# Patient Record
Sex: Male | Born: 1940 | Smoking: Never smoker
Health system: Southern US, Community
[De-identification: ages and names within clinical notes are randomized; demographics above are authoritative.]

## PROBLEM LIST (undated history)

## (undated) DIAGNOSIS — I4891 Unspecified atrial fibrillation: Secondary | ICD-10-CM

## (undated) DIAGNOSIS — E785 Hyperlipidemia, unspecified: Secondary | ICD-10-CM

## (undated) DIAGNOSIS — I509 Heart failure, unspecified: Secondary | ICD-10-CM

## (undated) DIAGNOSIS — N200 Calculus of kidney: Secondary | ICD-10-CM

## (undated) DIAGNOSIS — Z862 Personal history of diseases of the blood and blood-forming organs and certain disorders involving the immune mechanism: Secondary | ICD-10-CM

## (undated) DIAGNOSIS — D473 Essential (hemorrhagic) thrombocythemia: Secondary | ICD-10-CM

## (undated) DIAGNOSIS — I272 Pulmonary hypertension, unspecified: Secondary | ICD-10-CM

## (undated) DIAGNOSIS — E039 Hypothyroidism, unspecified: Secondary | ICD-10-CM

## (undated) DIAGNOSIS — I10 Essential (primary) hypertension: Secondary | ICD-10-CM

## (undated) DIAGNOSIS — N289 Disorder of kidney and ureter, unspecified: Secondary | ICD-10-CM

## (undated) HISTORY — DX: Pulmonary hypertension, unspecified: I27.20

## (undated) HISTORY — DX: Unspecified atrial fibrillation: I48.91

## (undated) HISTORY — DX: Essential (hemorrhagic) thrombocythemia: D47.3

## (undated) HISTORY — DX: Calculus of kidney: N20.0

## (undated) HISTORY — DX: Hypothyroidism, unspecified: E03.9

## (undated) HISTORY — DX: Heart failure, unspecified: I50.9

## (undated) HISTORY — DX: Hyperlipidemia, unspecified: E78.5

---

## 2010-10-29 ENCOUNTER — Encounter: Payer: Self-pay | Admitting: Oncology

## 2010-11-08 ENCOUNTER — Encounter: Payer: Non-veteran care | Admitting: Oncology

## 2010-11-09 ENCOUNTER — Encounter (HOSPITAL_BASED_OUTPATIENT_CLINIC_OR_DEPARTMENT_OTHER): Payer: Non-veteran care | Admitting: Oncology

## 2010-11-09 ENCOUNTER — Other Ambulatory Visit: Payer: Self-pay | Admitting: Oncology

## 2010-11-09 DIAGNOSIS — D531 Other megaloblastic anemias, not elsewhere classified: Secondary | ICD-10-CM

## 2010-11-09 DIAGNOSIS — I4891 Unspecified atrial fibrillation: Secondary | ICD-10-CM

## 2010-11-09 DIAGNOSIS — I10 Essential (primary) hypertension: Secondary | ICD-10-CM

## 2010-11-09 LAB — CBC WITH DIFFERENTIAL/PLATELET
EOS%: 0.2 % (ref 0.0–7.0)
Eosinophils Absolute: 0 10*3/uL (ref 0.0–0.5)
MCV: 110.8 fL — ABNORMAL HIGH (ref 79.3–98.0)
MONO%: 4.7 % (ref 0.0–14.0)
NEUT#: 3.7 10*3/uL (ref 1.5–6.5)
RBC: 3.6 10*6/uL — ABNORMAL LOW (ref 4.20–5.82)
RDW: 17.6 % — ABNORMAL HIGH (ref 11.0–14.6)
WBC: 6.1 10*3/uL (ref 4.0–10.3)
lymph#: 2.1 10*3/uL (ref 0.9–3.3)

## 2010-11-09 LAB — CHCC SMEAR

## 2011-02-05 ENCOUNTER — Telehealth: Payer: Self-pay | Admitting: Oncology

## 2011-02-05 NOTE — Telephone Encounter (Signed)
lmonvm for pt re appts for 1/11 and 4/10. Jan/april schedule mailed today.

## 2011-02-18 ENCOUNTER — Telehealth: Payer: Self-pay | Admitting: *Deleted

## 2011-02-18 ENCOUNTER — Other Ambulatory Visit: Payer: Self-pay | Admitting: Oncology

## 2011-02-18 ENCOUNTER — Other Ambulatory Visit (HOSPITAL_BASED_OUTPATIENT_CLINIC_OR_DEPARTMENT_OTHER): Payer: Non-veteran care | Admitting: Lab

## 2011-02-18 DIAGNOSIS — D531 Other megaloblastic anemias, not elsewhere classified: Secondary | ICD-10-CM

## 2011-02-18 LAB — CBC WITH DIFFERENTIAL/PLATELET
BASO%: 0.6 % (ref 0.0–2.0)
Eosinophils Absolute: 0 10*3/uL (ref 0.0–0.5)
HCT: 36.5 % — ABNORMAL LOW (ref 38.4–49.9)
MCHC: 34.1 g/dL (ref 32.0–36.0)
MONO#: 0.4 10*3/uL (ref 0.1–0.9)
NEUT#: 5.8 10*3/uL (ref 1.5–6.5)
RBC: 3.16 10*6/uL — ABNORMAL LOW (ref 4.20–5.82)
WBC: 7.8 10*3/uL (ref 4.0–10.3)
lymph#: 1.5 10*3/uL (ref 0.9–3.3)

## 2011-02-18 NOTE — Progress Notes (Signed)
Pt was waiting in lobby for results. Spoke w/ pt and relayed Dr. Ha's message this his Platelet count is stable and to continue his Hydrea 500 mg twice daily. Keep next appt in April as scheduled. Pt verbalized understanding.        

## 2011-02-18 NOTE — Telephone Encounter (Signed)
Pt was waiting in lobby for results. Spoke w/ pt and relayed Dr. Lodema Pilot message this his Platelet count is stable and to continue his Hydrea 500 mg twice daily. Keep next appt in April as scheduled. Pt verbalized understanding.

## 2011-05-14 ENCOUNTER — Encounter (HOSPITAL_COMMUNITY)
Admission: EM | Disposition: A | Discharge status: Home / Self Care | Payer: Self-pay | Source: Home / Self Care | Attending: Emergency Medicine

## 2011-05-14 ENCOUNTER — Emergency Department (HOSPITAL_COMMUNITY): Payer: Non-veteran care | Admitting: *Deleted

## 2011-05-14 ENCOUNTER — Encounter (HOSPITAL_COMMUNITY): Payer: Self-pay | Admitting: *Deleted

## 2011-05-14 ENCOUNTER — Emergency Department (HOSPITAL_COMMUNITY)
Admission: EM | Admit: 2011-05-14 | Discharge: 2011-05-14 | Disposition: A | Discharge status: Home / Self Care | Payer: Non-veteran care | Attending: Emergency Medicine | Admitting: Emergency Medicine

## 2011-05-14 ENCOUNTER — Encounter (HOSPITAL_COMMUNITY): Payer: Self-pay

## 2011-05-14 ENCOUNTER — Emergency Department (HOSPITAL_COMMUNITY): Payer: Non-veteran care

## 2011-05-14 DIAGNOSIS — N289 Disorder of kidney and ureter, unspecified: Secondary | ICD-10-CM | POA: Insufficient documentation

## 2011-05-14 DIAGNOSIS — IMO0002 Reserved for concepts with insufficient information to code with codable children: Secondary | ICD-10-CM | POA: Insufficient documentation

## 2011-05-14 DIAGNOSIS — S68620A Partial traumatic transphalangeal amputation of right index finger, initial encounter: Secondary | ICD-10-CM

## 2011-05-14 DIAGNOSIS — W312XXA Contact with powered woodworking and forming machines, initial encounter: Secondary | ICD-10-CM | POA: Insufficient documentation

## 2011-05-14 DIAGNOSIS — I10 Essential (primary) hypertension: Secondary | ICD-10-CM | POA: Insufficient documentation

## 2011-05-14 DIAGNOSIS — Z79899 Other long term (current) drug therapy: Secondary | ICD-10-CM | POA: Insufficient documentation

## 2011-05-14 HISTORY — DX: Disorder of kidney and ureter, unspecified: N28.9

## 2011-05-14 HISTORY — DX: Essential (primary) hypertension: I10

## 2011-05-14 HISTORY — PX: AMPUTATION: SHX166

## 2011-05-14 LAB — CBC
Hemoglobin: 15.4 g/dL (ref 13.0–17.0)
MCH: 27.9 pg (ref 26.0–34.0)
MCHC: 31.8 g/dL (ref 30.0–36.0)
Platelets: 358 10*3/uL (ref 150–400)
RDW: 16.1 % — ABNORMAL HIGH (ref 11.5–15.5)

## 2011-05-14 LAB — BASIC METABOLIC PANEL
Calcium: 8.7 mg/dL (ref 8.4–10.5)
GFR calc Af Amer: 26 mL/min — ABNORMAL LOW (ref >90)
GFR calc non Af Amer: 23 mL/min — ABNORMAL LOW (ref >90)
Glucose, Bld: 104 mg/dL — ABNORMAL HIGH (ref 70–99)
Sodium: 137 mEq/L (ref 135–145)

## 2011-05-14 LAB — PROTIME-INR: INR: 1.62 — ABNORMAL HIGH (ref 0.00–1.49)

## 2011-05-14 SURGERY — AMPUTATION DIGIT
Anesthesia: General | Site: Arm Lower | Laterality: Right | Wound class: Dirty or Infected

## 2011-05-14 MED ORDER — ONDANSETRON HCL 4 MG/2ML IJ SOLN
INTRAMUSCULAR | Status: DC | PRN
  Administered 2011-05-14: 4 mg via INTRAVENOUS

## 2011-05-14 MED ORDER — TETANUS-DIPHTH-ACELL PERTUSSIS 5-2.5-18.5 LF-MCG/0.5 IM SUSP
0.5000 mL | Freq: Once | INTRAMUSCULAR | Status: AC
  Administered 2011-05-14: 0.5 mL via INTRAMUSCULAR
  Filled 2011-05-14: qty 0.5

## 2011-05-14 MED ORDER — ACETAMINOPHEN 10 MG/ML IV SOLN
INTRAVENOUS | Status: AC
  Filled 2011-05-14: qty 100

## 2011-05-14 MED ORDER — BUPIVACAINE HCL (PF) 0.5 % IJ SOLN
30.0000 mL | Freq: Once | INTRAMUSCULAR | Status: AC
  Administered 2011-05-14: 30 mL

## 2011-05-14 MED ORDER — SODIUM CHLORIDE 0.9 % IV SOLN
INTRAVENOUS | Status: DC | PRN
  Administered 2011-05-14: 17:00:00 via INTRAVENOUS

## 2011-05-14 MED ORDER — FENTANYL CITRATE 0.05 MG/ML IJ SOLN
INTRAMUSCULAR | Status: DC | PRN
  Administered 2011-05-14: 100 ug via INTRAVENOUS

## 2011-05-14 MED ORDER — 0.9 % SODIUM CHLORIDE (POUR BTL) OPTIME
TOPICAL | Status: DC | PRN
  Administered 2011-05-14: 1000 mL

## 2011-05-14 MED ORDER — METOCLOPRAMIDE HCL 5 MG/ML IJ SOLN
INTRAMUSCULAR | Status: DC | PRN
  Administered 2011-05-14: 10 mg via INTRAVENOUS

## 2011-05-14 MED ORDER — DEXAMETHASONE SODIUM PHOSPHATE 10 MG/ML IJ SOLN
INTRAMUSCULAR | Status: DC | PRN
  Administered 2011-05-14: 10 mg via INTRAVENOUS

## 2011-05-14 MED ORDER — LIDOCAINE HCL (CARDIAC) 20 MG/ML IV SOLN
INTRAVENOUS | Status: DC | PRN
  Administered 2011-05-14: 100 mg via INTRAVENOUS

## 2011-05-14 MED ORDER — LACTATED RINGERS IV SOLN
INTRAVENOUS | Status: DC | PRN
  Administered 2011-05-14: 18:00:00 via INTRAVENOUS

## 2011-05-14 MED ORDER — KETAMINE HCL 50 MG/ML IJ SOLN
INTRAMUSCULAR | Status: DC | PRN
  Administered 2011-05-14 (×2): 20 mg via INTRAMUSCULAR
  Administered 2011-05-14: 10 mg via INTRAMUSCULAR

## 2011-05-14 MED ORDER — FENTANYL CITRATE 0.05 MG/ML IJ SOLN: 25.0000 ug | INTRAMUSCULAR | Status: DC | PRN

## 2011-05-14 MED ORDER — ACETAMINOPHEN 10 MG/ML IV SOLN
INTRAVENOUS | Status: DC | PRN
  Administered 2011-05-14: 1000 mg via INTRAVENOUS

## 2011-05-14 MED ORDER — PHENYLEPHRINE HCL 10 MG/ML IJ SOLN
10.0000 mg | INTRAVENOUS | Status: DC | PRN
  Administered 2011-05-14: 50 ug/min via INTRAVENOUS

## 2011-05-14 MED ORDER — EPHEDRINE SULFATE 50 MG/ML IJ SOLN
INTRAMUSCULAR | Status: DC | PRN
  Administered 2011-05-14: 10 mg via INTRAVENOUS
  Administered 2011-05-14: 15 mg via INTRAVENOUS
  Administered 2011-05-14: 5 mg via INTRAVENOUS

## 2011-05-14 MED ORDER — PROPOFOL 10 MG/ML IV BOLUS
INTRAVENOUS | Status: DC | PRN
  Administered 2011-05-14: 200 mg via INTRAVENOUS

## 2011-05-14 MED ORDER — BUPIVACAINE HCL (PF) 0.5 % IJ SOLN
INTRAMUSCULAR | Status: AC
  Filled 2011-05-14: qty 30

## 2011-05-14 MED ORDER — SUCCINYLCHOLINE CHLORIDE 20 MG/ML IJ SOLN
INTRAMUSCULAR | Status: DC | PRN
  Administered 2011-05-14: 100 mg via INTRAVENOUS

## 2011-05-14 MED ORDER — CEFAZOLIN SODIUM 1-5 GM-% IV SOLN
1.0000 g | Freq: Once | INTRAVENOUS | Status: AC
  Administered 2011-05-14: 1 g via INTRAVENOUS
  Filled 2011-05-14: qty 50

## 2011-05-14 SURGICAL SUPPLY — 33 items
BAG ZIPLOCK 12X15 (MISCELLANEOUS) ×2 IMPLANT
BANDAGE COBAN STERILE 2 (GAUZE/BANDAGES/DRESSINGS) ×2 IMPLANT
BANDAGE CONFORM 2  STR LF (GAUZE/BANDAGES/DRESSINGS) ×2 IMPLANT
BANDAGE GAUZE ELAST BULKY 4 IN (GAUZE/BANDAGES/DRESSINGS) ×2 IMPLANT
CANISTER SUCTION 2500CC (MISCELLANEOUS) ×2 IMPLANT
CLOTH BEACON ORANGE TIMEOUT ST (SAFETY) ×2 IMPLANT
CORDS BIPOLAR (ELECTRODE) ×2 IMPLANT
CUFF TOURN SGL QUICK 18 (TOURNIQUET CUFF) ×2 IMPLANT
CUFF TOURN SGL QUICK 24 (TOURNIQUET CUFF)
CUFF TRNQT CYL 24X4X40X1 (TOURNIQUET CUFF) IMPLANT
DRAPE SURG 17X11 SM STRL (DRAPES) ×4 IMPLANT
ELECT REM PT RETURN 9FT ADLT (ELECTROSURGICAL) ×2
ELECTRODE REM PT RTRN 9FT ADLT (ELECTROSURGICAL) ×1 IMPLANT
GAUZE XEROFORM 1X8 LF (GAUZE/BANDAGES/DRESSINGS) ×2 IMPLANT
GLOVE BIOGEL M STRL SZ7.5 (GLOVE) IMPLANT
GLOVE BIOGEL PI IND STRL 6.5 (GLOVE) ×1 IMPLANT
GLOVE BIOGEL PI IND STRL 7.0 (GLOVE) ×1 IMPLANT
GLOVE BIOGEL PI IND STRL 7.5 (GLOVE) ×1 IMPLANT
GLOVE BIOGEL PI INDICATOR 6.5 (GLOVE) ×1
GLOVE BIOGEL PI INDICATOR 7.0 (GLOVE) ×1
GLOVE BIOGEL PI INDICATOR 7.5 (GLOVE) ×1
KIT BASIN OR (CUSTOM PROCEDURE TRAY) ×2 IMPLANT
NEEDLE HYPO 22GX1.5 SAFETY (NEEDLE) ×2 IMPLANT
NS IRRIG 1000ML POUR BTL (IV SOLUTION) ×2 IMPLANT
PACK LOWER EXTREMITY WL (CUSTOM PROCEDURE TRAY) ×2 IMPLANT
POSITIONER SURGICAL ARM (MISCELLANEOUS) ×2 IMPLANT
SUT ETHILON 5 0 P 3 18 (SUTURE) ×2
SUT NYLON 10 0 BLK MONFIL (SUTURE) ×2 IMPLANT
SUT NYLON ETHILON 5-0 P-3 1X18 (SUTURE) ×2 IMPLANT
SWAB COLLECTION DEVICE MRSA (MISCELLANEOUS) IMPLANT
SYR CONTROL 10ML LL (SYRINGE) IMPLANT
TUBE ANAEROBIC SPECIMEN COL (MISCELLANEOUS) IMPLANT
UNDERPAD 30X30 INCONTINENT (UNDERPADS AND DIAPERS) ×2 IMPLANT

## 2011-05-14 NOTE — Anesthesia Postprocedure Evaluation (Signed)
  Anesthesia Post-op Note  Patient: Erik Ochoa  Procedure(s) Performed: Procedure(s) (LRB): AMPUTATION DIGIT (Right)  Patient Location: PACU  Anesthesia Type: General.  Level of Consciousness: awake and alert   Airway and Oxygen Therapy: Patient Spontanous Breathing  Post-op Pain: mild  Post-op Assessment: Post-op Vital signs reviewed, Patient's Cardiovascular Status Stable, Respiratory Function Stable, Patent Airway and No signs of Nausea or vomiting  Post-op Vital Signs: stable  Complications: No apparent anesthesia complications

## 2011-05-14 NOTE — ED Notes (Signed)
Patient reports that he got his right index finger caught between a log and log splitter Patient left his glove on and had a piece of material tied around his wrist. Patient able to move all fingers, but did not attempt to move right index finger.

## 2011-05-14 NOTE — Discharge Planning (Signed)
Discharge Instructions:  Keep your dressing clean, dry and in place until instructed to remove by Dr. Maryuri Warnke.  If the dressing becomes dirty or wet call the office for instructions during business hours. Elevate the extremity to help with swelling, this will also help with any discomfort. Take your medication as prescribed. No lifting with the injured  extremity. If you feel that the dressing is too tight, you may loosen it, but keep it on; finger tips should be pink; if there is a concern, call the office. [phone] Ice may be used if the injury is a fracture, do not apply ice directly to the skin. Please call the office on the next business day after discharge to arrange a follow up appointment.  Call [phone] between the hours of 9am - 5pm M-Th or 9am - 1pm on Fri. For most hand injuries and/or conditions, you may return to work using the uninjured hand (one handed duty) within 24-72 hours.  A detailed note will be provided to you at your follow up appointment or may contact the office prior to your follow up.    

## 2011-05-14 NOTE — Discharge Instructions (Signed)
Discharge Instructions:  Keep your dressing clean, dry and in place until instructed to remove by Dr. Coley.  If the dressing becomes dirty or wet call the office for instructions during business hours. Elevate the extremity to help with swelling, this will also help with any discomfort. Take your medication as prescribed. No lifting with the injured  extremity. If you feel that the dressing is too tight, you may loosen it, but keep it on; finger tips should be pink; if there is a concern, call the office. [phone] Ice may be used if the injury is a fracture, do not apply ice directly to the skin. Please call the office on the next business day after discharge to arrange a follow up appointment.  Call [phone] between the hours of 9am - 5pm M-Th or 9am - 1pm on Fri. For most hand injuries and/or conditions, you may return to work using the uninjured hand (one handed duty) within 24-72 hours.  A detailed note will be provided to you at your follow up appointment or may contact the office prior to your follow up.    

## 2011-05-14 NOTE — Op Note (Signed)
05/14/2011  6:37 PM  PATIENT:  Erik Ochoa  71 y.o. male  PRE-OPERATIVE DIAGNOSIS:  open fracture right index finger, near amputation, ischemia rif  POST-OPERATIVE DIAGNOSIS:  open fracture index finger, same  PROCEDURE:  Procedure(s): Attempted revascularization of RIF AMPUTATION DIGIT  SURGEON:  Surgeon(s): Johnette Abraham, MD  ANESTHESIA:   general  SPECIMEN:  No Specimen  FINDINGS:  Ischemic rif, ulnar digital artery repaired - no re-perfusion  PATIENT DISPOSITION:  PACU - hemodynamically stable.

## 2011-05-14 NOTE — Anesthesia Procedure Notes (Signed)
Procedure Name: Intubation Date/Time: 05/14/2011 5:03 PM Performed by: Randon Goldsmith CATHERINE PAYNE Pre-anesthesia Checklist: Patient identified, Emergency Drugs available, Suction available and Patient being monitored Patient Re-evaluated:Patient Re-evaluated prior to inductionOxygen Delivery Method: Circle system utilized Preoxygenation: Pre-oxygenation with 100% oxygen Intubation Type: IV induction, Rapid sequence and Cricoid Pressure applied Laryngoscope Size: Mac and 4 Grade View: Grade I Tube type: Oral Number of attempts: 1 Airway Equipment and Method: Stylet Placement Confirmation: ETT inserted through vocal cords under direct vision,  positive ETCO2,  CO2 detector and breath sounds checked- equal and bilateral Secured at: 21 cm Tube secured with: Tape Dental Injury: Teeth and Oropharynx as per pre-operative assessment

## 2011-05-14 NOTE — H&P (Signed)
Reason for Consult:R finger injury Referring Physician: [facility]  Erik Ochoa is an 71 y.o. right handed male.  HPI: Pt was using a log splitter and got RIF caught in machine, c/o pain, bleeding of IF, no sensation, minimal tip movement  Past Medical History  Diagnosis Date  . Hypertension   . Renal disorder     History reviewed. No pertinent past surgical history.  Family History  Problem Relation Age of Onset  . Cancer Mother   . Cancer Brother     Social History:  reports that he has never smoked. He has quit using smokeless tobacco. He reports that he does not drink alcohol or use illicit drugs.  Allergies: No Known Allergies  Medications: I have reviewed the patient's current medications.  Results for orders placed during the hospital encounter of 05/14/11 (from the past 48 hour(s))  CBC     Status: Abnormal   Collection Time   05/14/11  2:45 PM      Component Value Range Comment   WBC 17.5 (*) 4.0 - 10.5 (K/uL)    RBC 5.52  4.22 - 5.81 (MIL/uL)    Hemoglobin 15.4  13.0 - 17.0 (g/dL)    HCT 16.1  09.6 - 04.5 (%)    MCV 87.7  78.0 - 100.0 (fL)    MCH 27.9  26.0 - 34.0 (pg)    MCHC 31.8  30.0 - 36.0 (g/dL)    RDW 40.9 (*) 81.1 - 15.5 (%)    Platelets 358  150 - 400 (K/uL)   BASIC METABOLIC PANEL     Status: Abnormal   Collection Time   05/14/11  2:45 PM      Component Value Range Comment   Sodium 137  135 - 145 (mEq/L)    Potassium 3.6  3.5 - 5.1 (mEq/L)    Chloride 104  96 - 112 (mEq/L)    CO2 20  19 - 32 (mEq/L)    Glucose, Bld 104 (*) 70 - 99 (mg/dL)    BUN 35 (*) 6 - 23 (mg/dL)    Creatinine, Ser 9.14 (*) 0.50 - 1.35 (mg/dL)    Calcium 8.7  8.4 - 10.5 (mg/dL)    GFR calc non Af Amer 23 (*) >90 (mL/min)    GFR calc Af Amer 26 (*) >90 (mL/min)     Dg Finger Index Right  05/14/2011  *RADIOLOGY REPORT*  Clinical Data:  finger injury  RIGHT INDEX FINGER 2+V  Comparison: None.  Findings: Three views of the right second finger submitted.  There is  comminuted displaced fracture of the middle phalanx second finger with associated soft tissue injury.  IMPRESSION: Comminuted displaced fracture of the middle phalanx right second finger with associated soft tissues injury.  Original Report Authenticated By: Natasha Mead, M.D.    Pertinent items are noted in HPI. Temp:  [97.9 F (36.6 C)] 97.9 F (36.6 C) (04/06 1411) Resp:  [16] 16  (04/06 1411) BP: (181)/(81) 181/81 mmHg (04/06 1411) SpO2:  [95 %] 95 % (04/06 1411) General appearance: alert and cooperative Resp: clear to auscultation bilaterally Cardio: regular rate and rhythm GI: soft, nt LUE; wnl; RUE wnl except for complex open fracture laceration of RIF over middle phalanx, tip whitish, cold, insensate   Assessment/Plan: RIF near amputation, open fx, ischemic Plan: discussed in detail possibility of not being able to salvage if tip, will attempt to revascularize, may require amputation.  Erik Ochoa CHRISTOPHER 05/14/2011, 4:13 PM

## 2011-05-14 NOTE — ED Provider Notes (Signed)
History     CSN: [id number]  Arrival date & time 05/14/11  1350   First MD Initiated Contact with Patient 05/14/11 1500      Chief Complaint  Patient presents with  . Finger Injury    (Consider location/radiation/quality/duration/timing/severity/associated sxs/prior treatment) HPI 1 hour before injury pt sustained crush injury to R index finger in log splitter. Decreased sensation distal to laceration. > 5 years since last Td. R hand dominant. No other injuries. Bleeding controlled.  Past Medical History  Diagnosis Date  . Hypertension   . Renal disorder     History reviewed. No pertinent past surgical history.  Family History  Problem Relation Age of Onset  . Cancer Mother   . Cancer Brother     History  Substance Use Topics  . Smoking status: Never Smoker   . Smokeless tobacco: Former Neurosurgeon  . Alcohol Use: No      Review of Systems  Skin: Positive for wound.  Neurological: Positive for numbness.    Allergies  Review of patient's allergies indicates no known allergies.  Home Medications   Current Outpatient Rx  Name Route Sig Dispense Refill  . AMLODIPINE BESYLATE 10 MG PO TABS Oral Take 10 mg by mouth daily.    . FUROSEMIDE 20 MG PO TABS Oral Take 20 mg by mouth 2 (two) times daily.    Marland Kitchen HYDROXYUREA 500 MG PO CAPS Oral Take 500 mg by mouth 2 (two) times daily. May take with food to minimize GI side effects.    Marland Kitchen LEVOTHYROXINE SODIUM 25 MCG PO TABS Oral Take 25 mcg by mouth daily.    Marland Kitchen METOPROLOL TARTRATE 50 MG PO TABS Oral Take 50 mg by mouth 2 (two) times daily.    Marland Kitchen SIMVASTATIN 40 MG PO TABS Oral Take 40 mg by mouth every evening.      BP 141/65  Pulse 60  Temp(Src) 99.7 F (37.6 C) (Oral)  Resp 16  SpO2 94%  Physical Exam  Nursing note and vitals reviewed. Constitutional: He is oriented to person, place, and time. He appears well-developed and well-nourished. No distress.  HENT:  Head: Normocephalic and atraumatic.  Mouth/Throat: Oropharynx  is clear and moist.  Eyes: EOM are normal. Pupils are equal, round, and reactive to light.  Neck: Normal range of motion. Neck supple.  Cardiovascular: Normal rate and regular rhythm.   Pulmonary/Chest: Effort normal and breath sounds normal. No respiratory distress. He has no wheezes. He has no rales.  Abdominal: Soft. Bowel sounds are normal. There is no tenderness. There is no rebound and no guarding.  Musculoskeletal:       Pt has macerated laceration overlying middle phalanx of 2nd digit of R hand. Near complete amputation with distal portion attached solely by lateral soft tissue. Distal palor of soft tissue with questionable cap refill. Decreased sensation distally.  Neurological: He is alert and oriented to person, place, and time.       Decreased sensation distal R 2nd digit  Skin: Skin is warm and dry. No rash noted. No erythema.  Psychiatric: He has a normal mood and affect. His behavior is normal.    ED Course  Procedures (including critical care time)  Labs Reviewed  CBC - Abnormal; Notable for the following:    WBC 17.5 (*)    RDW 16.1 (*)    All other components within normal limits  BASIC METABOLIC PANEL - Abnormal; Notable for the following:    Glucose, Bld 104 (*)  BUN 35 (*)    Creatinine, Ser 2.66 (*)    GFR calc non Af Amer 23 (*)    GFR calc Af Amer 26 (*)    All other components within normal limits  PROTIME-INR - Abnormal; Notable for the following:    Prothrombin Time 19.5 (*)    INR 1.62 (*)    All other components within normal limits  APTT - Abnormal; Notable for the following:    aPTT 48 (*)    All other components within normal limits   Dg Finger Index Right  05/14/2011  *RADIOLOGY REPORT*  Clinical Data:  finger injury  RIGHT INDEX FINGER 2+V  Comparison: None.  Findings: Three views of the right second finger submitted.  There is comminuted displaced fracture of the middle phalanx second finger with associated soft tissue injury.  IMPRESSION:  Comminuted displaced fracture of the middle phalanx right second finger with associated soft tissues injury.  Original Report Authenticated By: Natasha Mead, M.D.     1. 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 Asked to prepare to go to OR.        Loren Racer, MD 05/15/11 [phone]

## 2011-05-14 NOTE — ED Notes (Signed)
Dr. Oletta Lamas notified that glove on right hand cut away except for portion at the right index finger due to clot attached.

## 2011-05-14 NOTE — Anesthesia Preprocedure Evaluation (Signed)
Anesthesia Evaluation  Patient identified by MRN, date of birth, ID band Patient awake    Reviewed: Allergy & Precautions, H&P , NPO status , Patient's Chart, lab work & pertinent test results  Airway Mallampati: II TM Distance: >3 FB Neck ROM: Full    Dental No notable dental hx.    Pulmonary neg pulmonary ROS,  breath sounds clear to auscultation  Pulmonary exam normal       Cardiovascular hypertension, Pt. on medications Rhythm:Regular Rate:Normal     Neuro/Psych negative neurological ROS  negative psych ROS   GI/Hepatic negative GI ROS, Neg liver ROS,   Endo/Other  negative endocrine ROS  Renal/GU negative Renal ROS  negative genitourinary   Musculoskeletal negative musculoskeletal ROS (+)   Abdominal   Peds negative pediatric ROS (+)  Hematology negative hematology ROS (+)   Anesthesia Other Findings   Reproductive/Obstetrics negative OB ROS                          Anesthesia Physical Anesthesia Plan  ASA: II  Anesthesia Plan: General   Post-op Pain Management:    Induction: Intravenous  Airway Management Planned: LMA and Oral ETT  Additional Equipment:   Intra-op Plan:   Post-operative Plan: Extubation in OR  Informed Consent: I have reviewed the patients History and Physical, chart, labs and discussed the procedure including the risks, benefits and alternatives for the proposed anesthesia with the patient or authorized representative who has indicated his/her understanding and acceptance.   Dental advisory given  Plan Discussed with: CRNA  Anesthesia Plan Comments:        Anesthesia Quick Evaluation  

## 2011-05-14 NOTE — Transfer of Care (Signed)
Immediate Anesthesia Transfer of Care Note  Patient: Erik Ochoa  Procedure(s) Performed: Procedure(s) (LRB): AMPUTATION DIGIT (Right)  Patient Location: PACU  Anesthesia Type: General  Level of Consciousness: awake, alert , oriented, patient cooperative and responds to stimulation  Airway & Oxygen Therapy: Patient Spontanous Breathing and Patient connected to face mask  Post-op Assessment: Report given to PACU RN, Post -op Vital signs reviewed and stable and Patient moving all extremities  Post vital signs: Reviewed and stable  Complications: No apparent anesthesia complications

## 2011-05-14 NOTE — Preoperative (Signed)
Beta Blockers   Reason not to administer Beta Blockers:Not Applicable 

## 2011-05-15 NOTE — Op Note (Signed)
NAME:  Erik Ochoa, Erik Ochoa NO.:  [id number]  MEDICAL RECORD NO.:  [id number]  LOCATION:  [facility]                         FACILITY:  [facility]  PHYSICIAN:  Johnette Abraham, MD    DATE OF BIRTH:  [birth year]  DATE OF PROCEDURE:  05/14/2011 DATE OF DISCHARGE:  05/14/2011                              OPERATIVE REPORT   PREOPERATIVE DIAGNOSIS:  Complex, open fracture near amputation of the right index finger.  POSTOPERATIVE DIAGNOSIS:  Complex, open fracture near amputation of the right index finger.  PROCEDURES: 1. Exploration of complex wound of the right index finger, attempted     revascularization of the right index finger ulnar digital artery. 2. Revision amputation with neurectomies and local advancement flap.  ANESTHESIA:  General.  INDICATIONS:  [name] is a 71 year old male who had his finger Crushed while he was splitting wood with a log splitter this afternoon and presented to the emergency department with a pale, ischemic, deformed finger with an obvious open fracture.  I was consulted.  X-ray examination revealed a comminuted fracture just distal to the PIP joint of the right index finger with comminution.  On exam, the finger was ischemic and insensate.  He had a circumferential laceration with just a little bit of skin on the dorsal radial side holding the finger on.  There was obvious extensor tendon and flexor tendon injury.  The risks, benefits, and alternatives of surgery were discussed with the patient.  A long discussion was had with the patient about revascularization and the unlikely survival of the distal part of the finger.  It was discussed with him that an attempt would be made if the structures were felt suitable for revascularization and if they were not, a revision amputation would be performed.  He agreed with this.  DESCRIPTION OF PROCEDURE:  The patient was taken to the operating room, placed supine on the operating room table.   General anesthesia was administered without difficulty.  A time-out was performed. Preoperative antibiotics were given.  The right upper extremity was prepped and draped in a normal sterile fashion.  The arm was elevated and exsanguinated.  The tourniquet was used, inflated to 250 mmHg. First, the wound was explored.  There was an obvious, open, comminuted fracture of the middle phalanx of the right index finger.  The extensor tendon was completely lacerated.  The FDP tendon was lacerated.  The FDS tendon, 1 slip was lacerated.  The neurovascular bundle on the ulnar side were completely lacerated; however, the artery was in view.  On the radial side, neurovascular structures seemed to be intact.  Because these vascular structures seemed to be intact, the tourniquet was released.  After irrigation and gentle debridement of skin, subcutaneous tissue, and bone after applying a warm irrigation solution, the finger did not pink up and, therefore, it was felt that the radial vascular structures were damaged and thrombosed most likely.  Afterwards, the tourniquet was reinflated.  Each end of the proximal and distal of the ulnar-sided digital artery were isolated.  Microscopic instruments and magnification were used.  A peter pan vessel clamp was placed on each proximal and distal vessel.  A vessel dilator was used to  expand the opening. The vessel was repaired with 4 interrupted 10-0 nylon sutures. Afterwards, the tourniquet was released.  There appeared to be flow through the anastomosis, however, after warm irrigation and allowing the finger to sit for a couple of minutes, the finger still did not pink up. At this point, the decision for amputation was then made.  A knife was used to transect the additional skin and structures on the radial side of the finger.  The finger was passed on to the back table.  Following the bone was rongeured back, neurectomies were performed.  The ulnar digital  artery was cauterized proximally.  A local advancement skin flap was used to cover the edge of the bone, and the bone was taken just distal to the joint.  The wound was closed with multiple interrupted 5-0 nylon sutures.  A sterile dressing was applied.  The patient tolerated the procedure well and was taken to the recovery room in stable condition.  ESTIMATED BLOOD LOSS:  50 cc.  COMPLICATIONS:  No acute complications.  SPECIMENS:  Just the amputated part for identification.     Johnette Abraham, MD     HCC/MEDQ  D:  05/14/2011  T:  05/15/2011  Job:  [id number]

## 2011-05-18 ENCOUNTER — Ambulatory Visit (HOSPITAL_BASED_OUTPATIENT_CLINIC_OR_DEPARTMENT_OTHER): Payer: Non-veteran care | Admitting: Oncology

## 2011-05-18 ENCOUNTER — Encounter: Payer: Self-pay | Admitting: Oncology

## 2011-05-18 ENCOUNTER — Telehealth: Payer: Self-pay | Admitting: Oncology

## 2011-05-18 ENCOUNTER — Other Ambulatory Visit (HOSPITAL_BASED_OUTPATIENT_CLINIC_OR_DEPARTMENT_OTHER): Payer: Non-veteran care | Admitting: Lab

## 2011-05-18 VITALS — BP 192/85 | HR 56 | Temp 97.8°F | Ht 76.0 in | Wt 227.6 lb

## 2011-05-18 DIAGNOSIS — D531 Other megaloblastic anemias, not elsewhere classified: Secondary | ICD-10-CM

## 2011-05-18 DIAGNOSIS — D473 Essential (hemorrhagic) thrombocythemia: Secondary | ICD-10-CM | POA: Insufficient documentation

## 2011-05-18 DIAGNOSIS — I4891 Unspecified atrial fibrillation: Secondary | ICD-10-CM | POA: Insufficient documentation

## 2011-05-18 DIAGNOSIS — I272 Pulmonary hypertension, unspecified: Secondary | ICD-10-CM | POA: Insufficient documentation

## 2011-05-18 DIAGNOSIS — I509 Heart failure, unspecified: Secondary | ICD-10-CM | POA: Insufficient documentation

## 2011-05-18 DIAGNOSIS — I10 Essential (primary) hypertension: Secondary | ICD-10-CM | POA: Insufficient documentation

## 2011-05-18 DIAGNOSIS — E039 Hypothyroidism, unspecified: Secondary | ICD-10-CM | POA: Insufficient documentation

## 2011-05-18 DIAGNOSIS — E785 Hyperlipidemia, unspecified: Secondary | ICD-10-CM

## 2011-05-18 LAB — CHCC SMEAR

## 2011-05-18 LAB — CBC WITH DIFFERENTIAL/PLATELET
BASO%: 0.5 % (ref 0.0–2.0)
EOS%: 1.1 % (ref 0.0–7.0)
MCH: 27 pg — ABNORMAL LOW (ref 27.2–33.4)
MCHC: 31.8 g/dL — ABNORMAL LOW (ref 32.0–36.0)
RBC: 5.22 10*6/uL (ref 4.20–5.82)
RDW: 16 % — ABNORMAL HIGH (ref 11.0–14.6)
WBC: 13.3 10*3/uL — ABNORMAL HIGH (ref 4.0–10.3)
lymph#: 1.2 10*3/uL (ref 0.9–3.3)
nRBC: 0 % (ref 0–0)

## 2011-05-18 NOTE — Telephone Encounter (Signed)
gve the pt her June,july,oct 2013 appt calendars

## 2011-05-18 NOTE — Progress Notes (Signed)
[facility] OFFICE PROGRESS NOTE  Cc:  Darrick Huntsman, MD  DIAGNOSIS:  JAK-2 positive essential thrombocytosis.   CURRENT THERAPY:  Hydrea 500mg  PO BID from Dec 2011 to Jan 2013.   Patient self-discontinued Hydrea.   INTERVAL HISTORY: Erik Ochoa 71 y.o. male returns for regular follow up.  He has been going to prayer sessions with his church group.  He had hoped that with prayers, he can healed all his ailments.  Therefore, he had self-discontinued Hydrea since January 2013.  He has denied all thrombotic complication.  He recently had a follow up with his cardiologist; his afib resolved.  Thus, he also discontinued Coumadin.  He was sawing woods and accidentally cut of the right index finger's distal joint.  He presented to a hand surgeon but could not have it re-attached.  He denied bleeding, purulent discharge, or pain in this finger.  Patient denies fatigue, headache, visual changes, confusion, drenching night sweats, palpable lymph node swelling, mucositis, odynophagia, dysphagia, nausea vomiting, jaundice, chest pain, palpitation, shortness of breath, dyspnea on exertion, productive cough, gum bleeding, epistaxis, hematemesis, hemoptysis, abdominal pain, abdominal swelling, early satiety, melena, hematochezia, hematuria, skin rash, spontaneous bleeding, joint swelling, joint pain, heat or cold intolerance, bowel bladder incontinence, back pain, focal motor weakness, paresthesia, depression, suicidal or homocidal ideation, feeling hopelessness.   Past Medical History  Diagnosis Date  . Hypertension   . Renal disorder   . ET (essential thrombocythemia)   . Hypothyroid   . CHF (congestive heart failure)   . A-fib   . Hyperlipidemia   . Pulmonary hypertension   . Nephrolithiasis     No past surgical history on file.  Current Outpatient Prescriptions  Medication Sig Dispense Refill  . acetaminophen-codeine (TYLENOL #3) 300-30 MG per tablet Take 1 tablet by  mouth every 4 (four) hours as needed.      Marland Kitchen amLODipine (NORVASC) 10 MG tablet Take 10 mg by mouth daily.      . furosemide (LASIX) 20 MG tablet Take 20 mg by mouth 2 (two) times daily. As needed      . levothyroxine (SYNTHROID, LEVOTHROID) 25 MCG tablet Take 25 mcg by mouth daily.      . metoprolol (LOPRESSOR) 50 MG tablet Take 50 mg by mouth 2 (two) times daily.      . simvastatin (ZOCOR) 40 MG tablet Take 40 mg by mouth every evening.        ALLERGIES:   has no known allergies.  REVIEW OF SYSTEMS:  The rest of the 14-point review of system was negative.   Filed Vitals:   05/18/11 1427  BP: 192/85  Pulse: 56  Temp: 97.8 F (36.6 C)   Wt Readings from Last 3 Encounters:  05/18/11 227 lb 9.6 oz (103.239 kg)   ECOG Performance status: 0  PHYSICAL EXAMINATION:   General:  well-nourished in no acute distress.  Eyes:  no scleral icterus.  ENT:  There were no oropharyngeal lesions.  Neck was without thyromegaly.  Lymphatics:  Negative cervical, supraclavicular or axillary adenopathy.  Respiratory: lungs were clear bilaterally without wheezing or crackles.  Cardiovascular:  Regular rate and rhythm, S1/S2, without murmur, rub or gallop.  There was no pedal edema.  GI:  abdomen was soft, flat, nontender, nondistended, without organomegaly.  Muscoloskeletal:  no spinal tenderness of palpation of vertebral spine.  Right index finger with bandaged at the distal joint.  Skin exam showed erythematous rash along the hairline on his face.  Neuro exam  was nonfocal.  Patient was able to get on and off exam table without assistance.  Gait was normal.  Patient was alerted and oriented.  Attention was good.   Language was appropriate.  Mood was normal without depression.  Speech was not pressured.  Thought content was not tangential.         LABORATORY/RADIOLOGY DATA:  Lab Results  Component Value Date   WBC 13.3* 05/18/2011   HGB 14.1 05/18/2011   HCT 44.4 05/18/2011   PLT 361 05/18/2011   GLUCOSE  104* 05/14/2011   NA 137 05/14/2011   K 3.6 05/14/2011   CL 104 05/14/2011   CREATININE 2.66* 05/14/2011   BUN 35* 05/14/2011   CO2 20 05/14/2011   INR 1.62* 05/14/2011    ASSESSMENT AND PLAN:    1. JAK-2 positive essential thrombocytosis:    I discussed with him the risk of thrombotic complications in patients older than 71 years-old who are not on Hydrea.  Hydrea has been proven in combination of low dose ASA to decrease the risk of thrombosis in this patient population.  He expressed understanding of the risk being off of Hydrea.  He still would still like to remain off of Hydrea for now.  I advised him to start low dose dose aspirin 81 mg PO daily when his right index finger heals.   2. Chronic kidney disease:  This is stage III/stage IV secondary to his poorly controlled hypertension.  He follows with a nephrologist. 3. Atrial fibrillation:  Resolved.  He is off of Coumadin for now.   4. Hypertension:  He is on amlodipine and metoprolol per PCP. 5. CHF:  euvolumic on exam today.  He is on Lasix, Metoprolol per PCP 6. Hypothyroidism:  He is on Synthroid per PCP.    7. Followup with lab-only in 2 and 4 months.  He has return appointment in 6 months. .   The length of time of the face-to-face encounter was 15 minutes. More than 50% of time was spent counseling and coordination of care.

## 2011-05-18 NOTE — Patient Instructions (Signed)
1.  Your diagnosis:  Essential Thrombocytosis (ET) -  Without treatment, you MAY develop blood clot (in legs, lungs, stroke, heart attack). -  With treatment with baby aspirin 81mg  by mouth once daily; AND HYDREA, the risk of blood clot is decreased.  -  You have decided to go off of Hydrea.  If you decide to resume Hydrea, please let us know.  2.  Follow up:   -  Lab-only appointment in about 2 and 4 months to ensure that your blood count has not significantly worsened. -  Follow up with my nurse practitioner Belenda Cruise in about 6 months.

## 2011-05-19 LAB — COMPREHENSIVE METABOLIC PANEL
ALT: 10 U/L (ref 0–53)
AST: 14 U/L (ref 0–37)
Albumin: 3.8 g/dL (ref 3.5–5.2)
Alkaline Phosphatase: 97 U/L (ref 39–117)
BUN: 36 mg/dL — ABNORMAL HIGH (ref 6–23)
Potassium: 4.4 mEq/L (ref 3.5–5.3)
Sodium: 140 mEq/L (ref 135–145)
Total Protein: 6.4 g/dL (ref 6.0–8.3)

## 2011-05-27 ENCOUNTER — Encounter (HOSPITAL_COMMUNITY): Payer: Self-pay | Admitting: General Surgery

## 2011-07-13 ENCOUNTER — Other Ambulatory Visit: Payer: Medicare Other | Admitting: Lab

## 2011-07-21 ENCOUNTER — Telehealth: Payer: Self-pay | Admitting: Oncology

## 2011-07-21 ENCOUNTER — Other Ambulatory Visit: Payer: Self-pay | Admitting: Oncology

## 2011-07-21 DIAGNOSIS — D473 Essential (hemorrhagic) thrombocythemia: Secondary | ICD-10-CM

## 2011-07-21 NOTE — Telephone Encounter (Signed)
pt called and cancelled appts for june-oct2013. pt did not want to r/s appt

## 2011-07-22 ENCOUNTER — Other Ambulatory Visit: Payer: Medicare Other | Admitting: Lab

## 2011-09-07 ENCOUNTER — Other Ambulatory Visit: Payer: Medicare Other | Admitting: Lab

## 2011-11-21 ENCOUNTER — Other Ambulatory Visit: Payer: Medicare Other

## 2011-11-21 ENCOUNTER — Ambulatory Visit: Payer: Medicare Other | Admitting: Oncology

## 2012-02-08 DIAGNOSIS — Z862 Personal history of diseases of the blood and blood-forming organs and certain disorders involving the immune mechanism: Secondary | ICD-10-CM

## 2012-02-08 HISTORY — DX: Personal history of diseases of the blood and blood-forming organs and certain disorders involving the immune mechanism: Z86.2

## 2012-05-17 ENCOUNTER — Inpatient Hospital Stay (HOSPITAL_COMMUNITY)
Admission: EM | Admit: 2012-05-17 | Discharge: 2012-05-20 | DRG: 683 | Disposition: A | Discharge status: Home / Self Care | Payer: Non-veteran care | Attending: Internal Medicine | Admitting: Internal Medicine

## 2012-05-17 ENCOUNTER — Encounter (HOSPITAL_COMMUNITY): Payer: Self-pay | Admitting: *Deleted

## 2012-05-17 DIAGNOSIS — D72829 Elevated white blood cell count, unspecified: Secondary | ICD-10-CM | POA: Diagnosis present

## 2012-05-17 DIAGNOSIS — I509 Heart failure, unspecified: Secondary | ICD-10-CM

## 2012-05-17 DIAGNOSIS — E876 Hypokalemia: Secondary | ICD-10-CM | POA: Diagnosis present

## 2012-05-17 DIAGNOSIS — M109 Gout, unspecified: Secondary | ICD-10-CM | POA: Diagnosis present

## 2012-05-17 DIAGNOSIS — E039 Hypothyroidism, unspecified: Secondary | ICD-10-CM | POA: Diagnosis present

## 2012-05-17 DIAGNOSIS — E8770 Fluid overload, unspecified: Secondary | ICD-10-CM

## 2012-05-17 DIAGNOSIS — I10 Essential (primary) hypertension: Secondary | ICD-10-CM

## 2012-05-17 DIAGNOSIS — E8779 Other fluid overload: Secondary | ICD-10-CM | POA: Diagnosis present

## 2012-05-17 DIAGNOSIS — N179 Acute kidney failure, unspecified: Secondary | ICD-10-CM | POA: Diagnosis present

## 2012-05-17 DIAGNOSIS — N184 Chronic kidney disease, stage 4 (severe): Secondary | ICD-10-CM | POA: Diagnosis present

## 2012-05-17 DIAGNOSIS — M2550 Pain in unspecified joint: Secondary | ICD-10-CM | POA: Diagnosis present

## 2012-05-17 DIAGNOSIS — E871 Hypo-osmolality and hyponatremia: Secondary | ICD-10-CM | POA: Diagnosis present

## 2012-05-17 DIAGNOSIS — E785 Hyperlipidemia, unspecified: Secondary | ICD-10-CM | POA: Diagnosis present

## 2012-05-17 DIAGNOSIS — I129 Hypertensive chronic kidney disease with stage 1 through stage 4 chronic kidney disease, or unspecified chronic kidney disease: Principal | ICD-10-CM | POA: Diagnosis present

## 2012-05-17 DIAGNOSIS — D473 Essential (hemorrhagic) thrombocythemia: Secondary | ICD-10-CM | POA: Diagnosis present

## 2012-05-17 LAB — URINALYSIS, ROUTINE W REFLEX MICROSCOPIC
Leukocytes, UA: NEGATIVE
Nitrite: NEGATIVE
Specific Gravity, Urine: 1.018 (ref 1.005–1.030)
pH: 5.5 (ref 5.0–8.0)

## 2012-05-17 LAB — COMPREHENSIVE METABOLIC PANEL
ALT: 12 U/L (ref 0–53)
BUN: 111 mg/dL — ABNORMAL HIGH (ref 6–23)
Calcium: 8.6 mg/dL (ref 8.4–10.5)
GFR calc Af Amer: 17 mL/min — ABNORMAL LOW (ref >90)
Glucose, Bld: 105 mg/dL — ABNORMAL HIGH (ref 70–99)
Sodium: 134 mEq/L — ABNORMAL LOW (ref 135–145)
Total Protein: 7.1 g/dL (ref 6.0–8.3)

## 2012-05-17 LAB — CBC WITH DIFFERENTIAL/PLATELET
Basophils Relative: 0 % (ref 0–1)
Eosinophils Absolute: 0.3 10*3/uL (ref 0.0–0.7)
Hemoglobin: 13.4 g/dL (ref 13.0–17.0)
MCH: 23.3 pg — ABNORMAL LOW (ref 26.0–34.0)
MCHC: 31.1 g/dL (ref 30.0–36.0)
Monocytes Absolute: 0.8 10*3/uL (ref 0.1–1.0)
Neutrophils Relative %: 88 % — ABNORMAL HIGH (ref 43–77)

## 2012-05-17 LAB — URINE MICROSCOPIC-ADD ON

## 2012-05-17 MED ORDER — AMLODIPINE BESYLATE 10 MG PO TABS
10.0000 mg | ORAL_TABLET | Freq: Every evening | ORAL | Status: DC
  Administered 2012-05-18 – 2012-05-19 (×2): 10 mg via ORAL
  Filled 2012-05-17 (×3): qty 1

## 2012-05-17 MED ORDER — ENOXAPARIN SODIUM 30 MG/0.3ML ~~LOC~~ SOLN
30.0000 mg | Freq: Every day | SUBCUTANEOUS | Status: DC
  Administered 2012-05-19: 30 mg via SUBCUTANEOUS
  Filled 2012-05-17 (×4): qty 0.3

## 2012-05-17 MED ORDER — SODIUM CHLORIDE 0.9 % IV SOLN
INTRAVENOUS | Status: DC
  Administered 2012-05-18: 04:00:00 via INTRAVENOUS

## 2012-05-17 MED ORDER — ONDANSETRON HCL 4 MG/2ML IJ SOLN: 4.0000 mg | Freq: Four times a day (QID) | INTRAMUSCULAR | Status: DC | PRN

## 2012-05-17 MED ORDER — ONDANSETRON HCL 4 MG PO TABS: 4.0000 mg | ORAL_TABLET | Freq: Four times a day (QID) | ORAL | Status: DC | PRN

## 2012-05-17 MED ORDER — LEVOTHYROXINE SODIUM 25 MCG PO TABS
25.0000 ug | ORAL_TABLET | Freq: Every morning | ORAL | Status: DC
  Administered 2012-05-18: 25 ug via ORAL
  Filled 2012-05-17 (×2): qty 1

## 2012-05-17 MED ORDER — METOPROLOL TARTRATE 50 MG PO TABS
50.0000 mg | ORAL_TABLET | Freq: Two times a day (BID) | ORAL | Status: DC
  Administered 2012-05-18 – 2012-05-20 (×5): 50 mg via ORAL
  Filled 2012-05-17 (×6): qty 1

## 2012-05-17 MED ORDER — SODIUM CHLORIDE 0.9 % IJ SOLN
3.0000 mL | Freq: Two times a day (BID) | INTRAMUSCULAR | Status: DC
  Administered 2012-05-18 – 2012-05-20 (×5): 3 mL via INTRAVENOUS

## 2012-05-17 MED ORDER — HYDROCODONE-ACETAMINOPHEN 5-325 MG PO TABS
1.0000 | ORAL_TABLET | ORAL | Status: DC | PRN
  Administered 2012-05-18: 1 via ORAL
  Administered 2012-05-18: 2 via ORAL
  Filled 2012-05-17: qty 2
  Filled 2012-05-17: qty 1

## 2012-05-17 NOTE — ED Provider Notes (Addendum)
Medical screening examination/treatment/procedure(s) were conducted as a shared visit with non-physician practitioner(s) and myself.  I personally evaluated the patient during the encounter   .Face to face Exam:  General:  A&Ox3 HEENT:  Atraumatic Resp:  Normal effort Abd:  Nondistended Neuro:No focal deficits    Nelia Shi, MD 05/17/12 2206  Nelia Shi, MD 06/15/12 1944

## 2012-05-17 NOTE — H&P (Signed)
[facility] History and Physical  Erik Ochoa ZOX:[id number] DOB: 1941/01/26 DOA: 05/17/2012  Referring physician: ED physician PCP: Darrick Huntsman   Chief Complaint: Elevated renal function tests at the PCP office   HPI:  Erik Ochoa is 72 yo male with history of HTN, chronic kidney disease, HLD, who presents with main concern of new abnormal kidney function tests that were checked at his PCP office. [facility] [place] and was called at home by his renal specialist that his kidney tests were elevated and was recommended to go to ED for further evaluation. Erik Ochoa denies chest pain, shortness of breath, no specific abdominal or urinary concerns. He has not noticed significant changes with frequency of voiding. He reports lower extremity swelling but explains it has been rather stable.    In ED, Cr = 3.71, WBC ~ 27,000. [facility] asked to admit for further evaluation. Dr Hyman Hopes with nephrology called and admission recommended.  Assessment and Plan:  Principal Problem:   Acute renal failure - likely worsening renal function imposed on chronic renal failure - review of records indicated Creatinine in 05/2011 = 2.66 - will admit the Erik Ochoa for further evaluation - obtain renal US, strict I's and O's, will ask for foley to be placed - BMP in AM - I will give him low rate fluids as he appears somewhat dry on exam but he has history of CHF and close monitoring will be needed - given CHF, Erik Ochoa may actually require Lasix but will first see trend of renal function  Active Problems:   Leukocytosis - unclear etiology, Erik Ochoa is afebrile and has no specific symptoms to indicate an infectious etiology at this time - will check CXR, UA is unremarkable  - CBC in AM, may need onc consultation    Hypertension - reasonable control on admission    Hyponatremia - possibly related to volume status but it is kind of hard to determine - follow up on BMP in AM   Hypokalemia - mild, will supplement  Hypothyroid - continue synthroid    ET (essential thrombocythemia)  Code Status: Full Family Communication: Erik Ochoa at bedside Disposition Plan: Admit to telemetry unit   Review of Systems:  Constitutional: Negative for fever, chills and malaise/fatigue. Negative for diaphoresis.  HENT: Negative for hearing loss, ear pain, nosebleeds, congestion, sore throat, neck pain, tinnitus and ear discharge.   Eyes: Negative for blurred vision, double vision, photophobia, pain, discharge and redness.  Respiratory: Negative for cough, hemoptysis, sputum production, shortness of breath, wheezing and stridor.   Cardiovascular: Negative for chest pain, palpitations, orthopnea, claudication and leg swelling.  Gastrointestinal: Negative for nausea, vomiting and abdominal pain. Negative for heartburn, constipation, blood in stool and melena.  Genitourinary: Negative for dysuria, urgency, frequency, hematuria and flank pain.  Musculoskeletal: Negative for myalgias, back pain, joint pain and falls.  Skin: Negative for itching and rash.  Neurological: Negative for dizziness and weakness. Negative for tingling, tremors, sensory change, speech change, focal weakness, loss of consciousness and headaches.  Endo/Heme/Allergies: Negative for environmental allergies and polydipsia. Does not bruise/bleed easily.  Psychiatric/Behavioral: Negative for suicidal ideas. The patient is not nervous/anxious.      Past Medical History  Diagnosis Date  . Hypertension   . Renal disorder   . ET (essential thrombocythemia)   . Hypothyroid   . CHF (congestive heart failure)   . A-fib   . Hyperlipidemia   . Pulmonary hypertension   . Nephrolithiasis     Past Surgical History  Procedure  Laterality Date  . Amputation  05/14/2011    Procedure: AMPUTATION DIGIT;  Surgeon: Johnette Abraham, MD;  Location: [facility];  Service: Plastics;  Laterality: Right;  right index finger attempted revascularization    Social History:  reports  that he has never smoked. He has quit using smokeless tobacco. He reports that he does not drink alcohol or use illicit drugs.  No Known Allergies  Family History  Problem Relation Age of Onset  . Cancer Mother   . Cancer Brother     Prior to Admission medications   Medication Sig Start Date End Date Taking? Authorizing Provider  amLODipine (NORVASC) 10 MG tablet Take 10 mg by mouth every evening.    Yes Historical Provider, MD  levothyroxine (SYNTHROID, LEVOTHROID) 25 MCG tablet Take 25 mcg by mouth every morning.    Yes Historical Provider, MD  metoprolol (LOPRESSOR) 50 MG tablet Take 50 mg by mouth 2 (two) times daily.   Yes Historical Provider, MD    Physical Exam: Filed Vitals:   05/17/12 2004  BP: 141/69  Pulse: 75  Temp: 97.6 F (36.4 C)  TempSrc: Oral  Resp: 18  Height: 6\' 4"  (1.93 m)  Weight: 89.812 kg (198 lb)  SpO2: 96%    Physical Exam  Constitutional: Appears well-developed and well-nourished. No distress.  HENT: Normocephalic. External right and left ear normal. Oropharynx is clear and moist.  Eyes: Conjunctivae and EOM are normal. PERRLA, no scleral icterus.  Neck: Normal ROM. Neck supple. No JVD. No tracheal deviation. No thyromegaly.  CVS: RRR, S1/S2 +, no murmurs, no gallops, no carotid bruit.  Pulmonary: Effort and breath sounds normal, no stridor, rhonchi, wheezes, rales.  Abdominal: Soft. BS +,  no distension, tenderness, rebound or guarding.  Musculoskeletal: Normal range of motion. +2 lower extremity pitting edema Lymphadenopathy: No lymphadenopathy noted, cervical, inguinal. Neuro: Alert. Normal reflexes, muscle tone coordination. No cranial nerve deficit. Skin: Skin is warm and dry. No rash noted. Not diaphoretic. No erythema. No pallor.  Psychiatric: Normal mood and affect. Behavior, judgment, thought content normal.   Labs on Admission:  Basic Metabolic Panel:  Recent Labs Lab 05/17/12 2030  NA 134*  K 3.4*  CL 91*  CO2 24  GLUCOSE  105*  BUN 111*  CREATININE 3.71*  CALCIUM 8.6   Liver Function Tests:  Recent Labs Lab 05/17/12 2030  AST 18  ALT 12  ALKPHOS 121*  BILITOT 0.6  PROT 7.1  ALBUMIN 3.3*    CBC:  Recent Labs Lab 05/17/12 2030  WBC 27.8*  NEUTROABS 24.5*  HGB 13.4  HCT 43.1  MCV 74.8*  PLT 499*   Radiological Exams on Admission: No results found.  EKG: Normal sinus rhythm, no ST/T wave changes  Debbora Presto, MD  [facility] Pager [phone]  If 7PM-7AM, please contact night-coverage [email or web address] Password [facility] 05/17/2012, 10:42 PM

## 2012-05-17 NOTE — ED Notes (Signed)
Called to give report nurse unavailable will call back.  

## 2012-05-17 NOTE — ED Notes (Signed)
Pt states he went to [place] today to have labs drawn d/t pt with chronic kidney disease. [facility] and told him that d/t his critical labs of him being dehydrated and elevated kidney function test. Pt was instructed to come to ED per nurse at [facility].

## 2012-05-17 NOTE — ED Provider Notes (Signed)
History     CSN: [id number]  Arrival date & time 05/17/12  1952   First MD Initiated Contact with Patient 05/17/12 2010      Chief Complaint  Patient presents with  . Abnormal Lab   HPI  History provided by the patient. Patient is a 72 year old male with history of hypertension, chronic kidney disease, hyperlipidemia CHF who presents with concerns for abnormally elevated lab tests. Patient generally followed at the [place] in [place] who was at home and called by his renal specialist with concerns for significant elevated kidneys lab tests. He was instructed to come to the emergency room for evaluation. Patient otherwise states that he feels well without any symptoms. He denies any increase shortness of breath per his baseline. No increased swelling to extremities per his baseline. No recent fever, chills or sweats. No nausea or vomiting. Normal urination. No other aggravating or alleviating factors. No other associated symptoms.    Past Medical History  Diagnosis Date  . Hypertension   . Renal disorder   . ET (essential thrombocythemia)   . Hypothyroid   . CHF (congestive heart failure)   . A-fib   . Hyperlipidemia   . Pulmonary hypertension   . Nephrolithiasis     Past Surgical History  Procedure Laterality Date  . Amputation  05/14/2011    Procedure: AMPUTATION DIGIT;  Surgeon: Johnette Abraham, MD;  Location: [facility];  Service: Plastics;  Laterality: Right;  right index finger attempted revascularization    Family History  Problem Relation Age of Onset  . Cancer Mother   . Cancer Brother     History  Substance Use Topics  . Smoking status: Never Smoker   . Smokeless tobacco: Former Neurosurgeon  . Alcohol Use: No      Review of Systems  Constitutional: Negative for fever and chills.  Gastrointestinal: Negative for nausea, vomiting and abdominal pain.  Genitourinary: Negative for dysuria and hematuria.  All other systems reviewed and are negative.    Allergies   Review of patient's allergies indicates no known allergies.  Home Medications   Current Outpatient Rx  Name  Route  Sig  Dispense  Refill  . amLODipine (NORVASC) 10 MG tablet   Oral   Take 10 mg by mouth every evening.          Marland Kitchen levothyroxine (SYNTHROID, LEVOTHROID) 25 MCG tablet   Oral   Take 25 mcg by mouth every morning.          . metoprolol (LOPRESSOR) 50 MG tablet   Oral   Take 50 mg by mouth 2 (two) times daily.           BP 141/69  Pulse 75  Temp(Src) 97.6 F (36.4 C) (Oral)  Resp 18  Ht 6\' 4"  (1.93 m)  Wt 198 lb (89.812 kg)  BMI 24.11 kg/m2  SpO2 96%  Physical Exam  Nursing note and vitals reviewed. Constitutional: He is oriented to person, place, and time. He appears well-developed and well-nourished. No distress.  HENT:  Head: Normocephalic.  Eyes: Conjunctivae are normal.  Cardiovascular: Normal rate and regular rhythm.   Pulmonary/Chest: Effort normal and breath sounds normal. No respiratory distress. He has no wheezes.  Abdominal: Soft. There is no tenderness.  No CVA tenderness  Musculoskeletal: Normal range of motion. He exhibits edema.  Bilateral pitting edema to lower extremities.  Neurological: He is alert and oriented to person, place, and time.  Skin: Skin is warm and dry.  Psychiatric: He has  a normal mood and affect. His behavior is normal.    ED Course  Procedures   Results for orders placed during the hospital encounter of 05/17/12  CBC WITH DIFFERENTIAL      Result Value Range   WBC 27.8 (*) 4.0 - 10.5 K/uL   RBC 5.76  4.22 - 5.81 MIL/uL   Hemoglobin 13.4  13.0 - 17.0 g/dL   HCT 16.1  09.6 - 04.5 %   MCV 74.8 (*) 78.0 - 100.0 fL   MCH 23.3 (*) 26.0 - 34.0 pg   MCHC 31.1  30.0 - 36.0 g/dL   RDW 40.9 (*) 81.1 - 91.4 %   Platelets 499 (*) 150 - 400 K/uL   Neutrophils Relative 88 (*) 43 - 77 %   Lymphocytes Relative 8 (*) 12 - 46 %   Monocytes Relative 3  3 - 12 %   Eosinophils Relative 1  0 - 5 %   Basophils Relative 0   0 - 1 %   Neutro Abs 24.5 (*) 1.7 - 7.7 K/uL   Lymphs Abs 2.2  0.7 - 4.0 K/uL   Monocytes Absolute 0.8  0.1 - 1.0 K/uL   Eosinophils Absolute 0.3  0.0 - 0.7 K/uL   Basophils Absolute 0.0  0.0 - 0.1 K/uL   Smear Review MORPHOLOGY UNREMARKABLE    COMPREHENSIVE METABOLIC PANEL      Result Value Range   Sodium 134 (*) 135 - 145 mEq/L   Potassium 3.4 (*) 3.5 - 5.1 mEq/L   Chloride 91 (*) 96 - 112 mEq/L   CO2 24  19 - 32 mEq/L   Glucose, Bld 105 (*) 70 - 99 mg/dL   BUN 782 (*) 6 - 23 mg/dL   Creatinine, Ser 9.56 (*) 0.50 - 1.35 mg/dL   Calcium 8.6  8.4 - 21.3 mg/dL   Total Protein 7.1  6.0 - 8.3 g/dL   Albumin 3.3 (*) 3.5 - 5.2 g/dL   AST 18  0 - 37 U/L   ALT 12  0 - 53 U/L   Alkaline Phosphatase 121 (*) 39 - 117 U/L   Total Bilirubin 0.6  0.3 - 1.2 mg/dL   GFR calc non Af Amer 15 (*) >90 mL/min   GFR calc Af Amer 17 (*) >90 mL/min        No diagnosis found.    MDM  8:15 PM patient seen and evaluated. Patient well-appearing without any complaints.  Patient discussed with attending physician. Patient with anion gap and elevated BUN. Will consult nephrology in plan for possible hospitalist admission.   Spoke with Dr. Hyman Hopes with nephrology. He agrees that hospitalization would be a good idea with 24 hour urine monitoring through Foley catheter as well as possible renal ultrasound performed tomorrow. He has taken patient's name for nephrology to consult on tomorrow.  At this time will plan to admit to the hospitalist.  Spoke with Dr. Izola Price on call for [facility]. They will admit patient to telemetry bed under team 8   Date: 05/17/2012  Rate: 73  Rhythm: normal sinus rhythm  QRS Axis: normal  Intervals: PR prolonged  ST/T Wave abnormalities: nonspecific ST/T changes  Conduction Disutrbances:first-degree A-V block   Narrative Interpretation:   Old EKG Reviewed: none available    Angus Seller, PA-C 05/17/12 2202

## 2012-05-18 ENCOUNTER — Inpatient Hospital Stay (HOSPITAL_COMMUNITY): Payer: Non-veteran care

## 2012-05-18 ENCOUNTER — Encounter (HOSPITAL_COMMUNITY): Payer: Self-pay

## 2012-05-18 DIAGNOSIS — E8770 Fluid overload, unspecified: Secondary | ICD-10-CM | POA: Diagnosis present

## 2012-05-18 DIAGNOSIS — E8779 Other fluid overload: Secondary | ICD-10-CM

## 2012-05-18 DIAGNOSIS — I10 Essential (primary) hypertension: Secondary | ICD-10-CM

## 2012-05-18 DIAGNOSIS — M2550 Pain in unspecified joint: Secondary | ICD-10-CM | POA: Diagnosis present

## 2012-05-18 LAB — CBC
HCT: 41 % (ref 39.0–52.0)
MCHC: 30.5 g/dL (ref 30.0–36.0)
MCV: 75.6 fL — ABNORMAL LOW (ref 78.0–100.0)
Platelets: 413 10*3/uL — ABNORMAL HIGH (ref 150–400)
RDW: 18.5 % — ABNORMAL HIGH (ref 11.5–15.5)

## 2012-05-18 LAB — BASIC METABOLIC PANEL
BUN: 109 mg/dL — ABNORMAL HIGH (ref 6–23)
Calcium: 8.3 mg/dL — ABNORMAL LOW (ref 8.4–10.5)
Chloride: 92 mEq/L — ABNORMAL LOW (ref 96–112)
Creatinine, Ser: 3.58 mg/dL — ABNORMAL HIGH (ref 0.50–1.35)
GFR calc Af Amer: 18 mL/min — ABNORMAL LOW (ref >90)

## 2012-05-18 LAB — URIC ACID: Uric Acid, Serum: 14.9 mg/dL — ABNORMAL HIGH (ref 4.0–7.8)

## 2012-05-18 LAB — OSMOLALITY, URINE: Osmolality, Ur: 419 mOsm/kg (ref 390–1090)

## 2012-05-18 LAB — CREATININE, URINE, RANDOM: Creatinine, Urine: 54.6 mg/dL

## 2012-05-18 MED ORDER — PREDNISONE 20 MG PO TABS
40.0000 mg | ORAL_TABLET | Freq: Every day | ORAL | Status: DC
  Administered 2012-05-19 – 2012-05-20 (×2): 40 mg via ORAL
  Filled 2012-05-18 (×3): qty 2

## 2012-05-18 MED ORDER — FUROSEMIDE 10 MG/ML IJ SOLN
60.0000 mg | Freq: Two times a day (BID) | INTRAMUSCULAR | Status: DC
  Filled 2012-05-18 (×2): qty 6

## 2012-05-18 MED ORDER — DEXTROSE 5 % IV SOLN
120.0000 mg | Freq: Three times a day (TID) | INTRAVENOUS | Status: DC
  Administered 2012-05-18 – 2012-05-19 (×3): 120 mg via INTRAVENOUS
  Filled 2012-05-18 (×3): qty 12

## 2012-05-18 MED ORDER — PREDNISONE 50 MG PO TABS
60.0000 mg | ORAL_TABLET | Freq: Once | ORAL | Status: AC
  Administered 2012-05-18: 60 mg via ORAL
  Filled 2012-05-18: qty 1

## 2012-05-18 MED ORDER — LEVOTHYROXINE SODIUM 50 MCG PO TABS
50.0000 ug | ORAL_TABLET | Freq: Every morning | ORAL | Status: DC
  Administered 2012-05-19 – 2012-05-20 (×2): 50 ug via ORAL
  Filled 2012-05-18 (×3): qty 1

## 2012-05-18 MED ORDER — POTASSIUM CHLORIDE CRYS ER 20 MEQ PO TBCR
40.0000 meq | EXTENDED_RELEASE_TABLET | Freq: Once | ORAL | Status: AC
  Administered 2012-05-18: 40 meq via ORAL
  Filled 2012-05-18: qty 2

## 2012-05-18 NOTE — Consult Note (Signed)
Erik Ochoa 05/18/2012 Erik Ochoa D Requesting Physician:  Dr Janee Morn    Reason for Consult:  Renal failure HPI: The patient is a 72 y.o. year-old with long hx of DM and HTN sent by PCP to ER for "abnormal labs".  Creatinine higher, I believe.  Here pt given fluid for creat of 3.5, but cxr today showed edema and IVF's stopped.    Patient denies any SOB, CP or ankle swelling.  He has been seeing a [place] nephrologist in [place] for two years. He is on schedule for an AVF (placed by [facility] surgeons) next month.  He denies any uremic symptoms.      ROS  no fever  + joint pains hands, feet sometimes, R shoulder  no abd pain  no n/v/d   no wt loss  no confusion   Past Medical History:  Past Medical History  Diagnosis Date  . Hypertension   . Renal disorder   . ET (essential thrombocythemia)   . Hypothyroid   . CHF (congestive heart failure)   . A-fib   . Hyperlipidemia   . Pulmonary hypertension   . Nephrolithiasis     Past Surgical History:  Past Surgical History  Procedure Laterality Date  . Amputation  05/14/2011    Procedure: AMPUTATION DIGIT;  Surgeon: Johnette Abraham, MD;  Location: [facility];  Service: Plastics;  Laterality: Right;  right index finger attempted revascularization    Family History:  Family History  Problem Relation Age of Onset  . Cancer Mother   . Cancer Brother    Social History:  reports that he has never smoked. He has quit using smokeless tobacco. He reports that he does not drink alcohol or use illicit drugs.  Allergies: No Known Allergies  Home medications: Prior to Admission medications   Medication Sig Start Date End Date Taking? Authorizing Provider  amLODipine (NORVASC) 10 MG tablet Take 10 mg by mouth every evening.    Yes Historical Provider, MD  furosemide (LASIX) 20 MG tablet Take 40 mg by mouth daily.   Yes Historical Provider, MD  hydrALAZINE (APRESOLINE) 50 MG tablet Take 50 mg by mouth 3 (three) times daily.   Yes  Historical Provider, MD  levothyroxine (SYNTHROID) 50 MCG tablet Take 50 mcg by mouth daily before breakfast.   Yes Historical Provider, MD  metoprolol succinate (TOPROL-XL) 100 MG 24 hr tablet Take 50 mg by mouth 2 (two) times daily. Take with or immediately following a meal.   Yes Historical Provider, MD    Labs: Basic Metabolic Panel:  Recent Labs Lab 05/17/12 2030 05/18/12 0505  NA 134* 133*  K 3.4* 3.2*  CL 91* 92*  CO2 24 25  GLUCOSE 105* 114*  BUN 111* 109*  CREATININE 3.71* 3.58*  CALCIUM 8.6 8.3*   Liver Function Tests:  Recent Labs Lab 05/17/12 2030  AST 18  ALT 12  ALKPHOS 121*  BILITOT 0.6  PROT 7.1  ALBUMIN 3.3*   No results found for this basename: LIPASE, AMYLASE,  in the last 168 hours No results found for this basename: AMMONIA,  in the last 168 hours CBC:  Recent Labs Lab 05/17/12 2030 05/18/12 0505  WBC 27.8* 22.2*  NEUTROABS 24.5*  --   HGB 13.4 12.5*  HCT 43.1 41.0  MCV 74.8* 75.6*  PLT 499* 413*   PT/INR: @LABRCNTIP (inr:5) Cardiac Enzymes: )No results found for this basename: CKTOTAL, CKMB, CKMBINDEX, TROPONINI,  in the last 168 hours CBG: No results found for this basename: GLUCAP,  in the last 168 hours   Physical Exam:  Blood pressure 144/72, pulse 80, temperature 98.4 F (36.9 C), temperature source Oral, resp. rate 20, height 6\' 4"  (1.93 m), weight 90.81 kg (200 lb 3.2 oz), SpO2 95.00%.  Gen: alert, no distress Skin: no rash, cyanosis HEENT:  EOMI, sclera anicteric, throat clear Neck: no JVD, no LAN Chest: faint occ crackles at the bases CV: regular, no rub or gallop, soft SEM, pedal pulses intact Abdomen: soft, nontender, +BS Ext: trace ankle edema bilat, no joint effusion or deformity, no gangrene or ulceration Neuro: alert, Ox3, no focal deficit, no asterixis  UA >> yellow, clear, 1.018, 100 prot, negative Hb/LE/nitrite UNa 83, UCreat 54, UOsm 419 Abd [place] >> R kidney 11.5 cm and echogenic, L kidney atrophic at 8.2cm,  bilat inc'd echogenicity, no hydro CXR 4/11 >> bilat pulm edema  Date    Creat  eGFR May 14, 2011  2.66  23  May 17, 2012  3.71  15  May 18, 2012  3.58  16  Impression/Plan 1. CKD late stage IV, borderline stage V- this is likely due to DM and HTN.  L kidney is atrophic by [place] which pt is aware of.  Has nephrologist in [place] with the [place].  Is volume overloaded with mild pulm edema but no significant peripheral edema. Needs diuresis with IV lasix until cxr clear then resume outpt lasix at 80 mg bid minimum.  He has an appt for AVF placement next month.  When he is ready for d/c, send him home to f/u with primary nephrologist at [facility].  Remove foley. He does not need dialysis now. Discussed with pt and questions answered.  Will sign off, please call as needed. 2. DM2 3. HTN 4. Pulm edema- by cxr, pt essentially asymptomatic 5. Arthritis- swollen red joints on some fingers, ? Gout vs other. Defer to primary   Vinson Moselle  MD [facility] [phone] pgr     [phone] cell 05/18/2012, 3:27 PM

## 2012-05-18 NOTE — Progress Notes (Signed)
[facility] PROGRESS NOTE  Erik Ochoa NFA:[id number] DOB: 26-May-1940 DOA: 05/17/2012 PCP: Darrick Huntsman  Assessment/Plan: #1 acute on chronic renal failure versus progressive chronic kidney disease Patient likely with progressive chronic kidney disease and currently states 4-5. Chest x-ray consistent with pulmonary edema. Patient with JVD. Patient with lower extremity edema. Will discontinue Foley catheter. Will place on IV Lasix 120 mg IV every 8 hours. Repeat chest x-ray the morning. Renal ultrasound with atrophic left kidney without hydronephrosis or focal mass. Once chest x-ray is clear with transitioned to Lasix 80 mg by mouth twice a day. Will likely need followup with his nephrologist as outpatient.  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 Will check a uric acid. Check a sedimentation rate. Was on a short burst of prednisone.  #3 leukocytosis Questionable etiology. Patient is currently afebrile. Urinalysis is negative. Chest x-ray is negative for any acute infiltrate. Likely reactive leukocytosis versus secondary to possible inflammatory disease. Will check a uric acid. Check a sedimentation rate. Place on prednisone. Will likely need followup as outpatient.  #4 hypertension Stable. Continue metoprolol.  #5 hyponatremia Likely secondary to hypervolemic hyponatremic. Will place on IV Lasix. Follow.  #6 hypokalemia Replete.  #7 hypothyroidism Increase Synthroid to home dose of 50 mcg daily.  #8 essential thrombocytemia Stable.  Code Status: Full Family Communication: Updated patient no family at bedside. Disposition Plan: Home when medically stable   Consultants:  Nephrology: Dr. Arlean Hopping 05/18/2012  Procedures: Renal ultrasound 05/18/2012  Antibiotics:  None  HPI/Subjective: Patient denies any chest pain. No shortness of breath. Patient complaining of migratory polyarthritis. Patient concerned and stated that he might have gout.  Patient states is having good urine output prior to admission.   Objective: Filed Vitals:   05/18/12 0550 05/18/12 0913 05/18/12 1156 05/18/12 1358  BP: 132/64 137/73 154/76 144/72  Pulse: 72 73 69 80  Temp: 98.5 F (36.9 C) 97.9 F (36.6 C) 98.2 F (36.8 C) 98.4 F (36.9 C)  TempSrc: Oral Oral Oral Oral  Resp: 18 18 18 20   Height:      Weight: 90.81 kg (200 lb 3.2 oz)     SpO2: 93% 97% 93% 95%    Intake/Output Summary (Last 24 hours) at 05/18/12 1622 Last data filed at 05/18/12 1543  Gross per 24 hour  Intake    530 ml  Output   1276 ml  Net   -746 ml   Filed Weights   05/17/12 2004 05/18/12 0550  Weight: 89.812 kg (198 lb) 90.81 kg (200 lb 3.2 oz)    Exam:   General:  NAD  Cardiovascular: RRR. Positive JVD.  Respiratory: Some scattered crackles.  Abdomen: soft/nt/nd/+bs  Extremities: No clubbing no cyanosis. 1-2+ bilateral lower extremity edema.  Data Reviewed: Basic Metabolic Panel:  Recent Labs Lab 05/17/12 2030 05/18/12 0505  NA 134* 133*  K 3.4* 3.2*  CL 91* 92*  CO2 24 25  GLUCOSE 105* 114*  BUN 111* 109*  CREATININE 3.71* 3.58*  CALCIUM 8.6 8.3*   Liver Function Tests:  Recent Labs Lab 05/17/12 2030  AST 18  ALT 12  ALKPHOS 121*  BILITOT 0.6  PROT 7.1  ALBUMIN 3.3*   No results found for this basename: LIPASE, AMYLASE,  in the last 168 hours No results found for this basename: AMMONIA,  in the last 168 hours CBC:  Recent Labs Lab 05/17/12 2030 05/18/12 0505  WBC 27.8* 22.2*  NEUTROABS 24.5*  --   HGB 13.4 12.5*  HCT 43.1 41.0  MCV 74.8* 75.6*  PLT 499* 413*   Cardiac Enzymes: No results found for this basename: CKTOTAL, CKMB, CKMBINDEX, TROPONINI,  in the last 168 hours BNP (last 3 results)  Recent Labs  05/18/12 0505  PROBNP 7898.0*   CBG: No results found for this basename: GLUCAP,  in the last 168 hours  No results found for this or any previous visit (from the past 240 hour(s)).   Studies: US  Renal  05/18/2012  *RADIOLOGY REPORT*  Clinical Data: Elevated BUN and creatinine. History of hypertension.  RENAL/URINARY TRACT ULTRASOUND COMPLETE  Comparison:  None.  Findings:  Right Kidney:  11.5 cm in length.  Parenchyma is echogenic.  A cyst measures 2.2 x 2.5 x 2.5 cm in the lower pole region.  No hydronephrosis.  Left Kidney:  Atrophic, 8.2 cm length.  Renal parenchyma is thinned.  No hydronephrosis or focal mass identified.  Bladder:  Bladder decompressed by a Foley catheter.  Additional findings:  Note is made of marked splenomegaly.  IMPRESSION:  1.  Atrophic left kidney without hydronephrosis or focal mass. 2.  Echogenic renal parenchyma, consistent with intrinsic renal disease. 3.  Right renal cysts without hydronephrosis. 4.  Incidental note of marked splenomegaly.   Original Report Authenticated By: Norva Pavlov, M.D.    Dg Chest Port 1 View  05/18/2012  *RADIOLOGY REPORT*  Clinical Data: CHF  PORTABLE CHEST - 1 VIEW  Comparison: None.  Findings: Normal cardiac silhouette and mediastinal contours given AP projection and decreased lung volumes.  Pulmonary vasculature is indistinct with cephalization of flow.  Minimal bibasilar opacities, left than right.  No definite effusion or pneumothorax. No acute osseous abnormality.  IMPRESSION: Mild pulmonary edema with perihilar and bibasilar atelectasis.   Original Report Authenticated By: Tacey Ruiz, MD     Scheduled Meds: . amLODipine  10 mg Oral QPM  . enoxaparin (LOVENOX) injection  30 mg Subcutaneous QHS  . furosemide  120 mg Intravenous Q8H  . [START ON 05/19/2012] levothyroxine  50 mcg Oral q morning - 10a  . metoprolol  50 mg Oral BID  . [START ON 05/19/2012] predniSONE  40 mg Oral QAC breakfast  . predniSONE  60 mg Oral Once  . sodium chloride  3 mL Intravenous Q12H   Continuous Infusions:   Principal Problem:   Acute renal failure Active Problems:   Hyperlipidemia   Hypothyroid   ET (essential thrombocythemia)    Hypertension   Hyponatremia   Hypokalemia   Leukocytosis   Polyarthralgia   Volume overload    Time spent: > 35 mins    [facility]  [facility] Pager [phone]. If 7PM-7AM, please contact night-coverage at [email or web address], password [facility] 05/18/2012, 4:22 PM  LOS: 1 day

## 2012-05-19 ENCOUNTER — Inpatient Hospital Stay (HOSPITAL_COMMUNITY): Payer: Non-veteran care

## 2012-05-19 DIAGNOSIS — N184 Chronic kidney disease, stage 4 (severe): Secondary | ICD-10-CM | POA: Diagnosis present

## 2012-05-19 DIAGNOSIS — E871 Hypo-osmolality and hyponatremia: Secondary | ICD-10-CM

## 2012-05-19 DIAGNOSIS — E876 Hypokalemia: Secondary | ICD-10-CM

## 2012-05-19 LAB — URINE CULTURE

## 2012-05-19 LAB — BASIC METABOLIC PANEL
Calcium: 9.2 mg/dL (ref 8.4–10.5)
GFR calc non Af Amer: 17 mL/min — ABNORMAL LOW (ref >90)
Glucose, Bld: 151 mg/dL — ABNORMAL HIGH (ref 70–99)
Potassium: 3.4 mEq/L — ABNORMAL LOW (ref 3.5–5.1)
Sodium: 136 mEq/L (ref 135–145)

## 2012-05-19 LAB — CBC
Hemoglobin: 13.6 g/dL (ref 13.0–17.0)
Platelets: 402 10*3/uL — ABNORMAL HIGH (ref 150–400)
RBC: 5.67 MIL/uL (ref 4.22–5.81)
WBC: 28.5 10*3/uL — ABNORMAL HIGH (ref 4.0–10.5)

## 2012-05-19 MED ORDER — FUROSEMIDE 80 MG PO TABS
80.0000 mg | ORAL_TABLET | Freq: Two times a day (BID) | ORAL | Status: DC
  Administered 2012-05-19 – 2012-05-20 (×2): 80 mg via ORAL
  Filled 2012-05-19 (×4): qty 1

## 2012-05-19 MED ORDER — POTASSIUM CHLORIDE CRYS ER 20 MEQ PO TBCR
40.0000 meq | EXTENDED_RELEASE_TABLET | Freq: Once | ORAL | Status: AC
  Administered 2012-05-19: 40 meq via ORAL
  Filled 2012-05-19: qty 2

## 2012-05-19 NOTE — Progress Notes (Signed)
[facility] PROGRESS NOTE  Haik Mahoney ZOX:[id number] DOB: 1940/08/04 DOA: 05/17/2012 PCP: Darrick Huntsman  Assessment/Plan: #1 Progressive chronic kidney disease Patient likely with progressive chronic kidney disease and currently stage 4-5. Chest x-ray consistent with pulmonary edema and repeat improved. Patient with improved lower extremity edema. Will discontinue Foley catheter. Good UOP = -4.001L. Will change IV Lasix 120 mg IV every 8 hours to home lasix 80 mg PO BID. Renal ultrasound with atrophic left kidney without hydronephrosis or focal mass. Will likely need followup with his nephrologist as outpatient.  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 Clinical improvement. Continue short burst of prednisone.  #3 leukocytosis Questionable etiology. Likely secondary to gout vs polyarthritis. Patient is currently afebrile. Urinalysis is negative. Chest x-ray is negative for any acute infiltrate. Likely reactive leukocytosis versus secondary to possible inflammatory disease. Uric acid elevated.  Sedimentation rate elevated. Continue prednisone. Will likely need followup as outpatient.  #4 hypertension Stable. Continue metoprolol.  #5 hyponatremia Likely secondary to hypervolemic hyponatremic. Improved with diuretics.  #6 hypokalemia Replete.  #7 hypothyroidism Continue Synthroid at home dose of 50 mcg daily.  #8. Volume overload Secondary to CKD 4/5. CXR improved with diuretics. Follow.  #9 Essential thrombocytemia Stable.  Code Status: Full Family Communication: Updated patient no family at bedside. Disposition Plan: Home when medically stable in 1-2 days.   Consultants:  Nephrology: Dr. Arlean Hopping 05/18/2012  Procedures: Renal ultrasound 05/18/2012  Antibiotics:  None  HPI/Subjective: Patient denies any chest pain. No shortness of breath. Patient states shoulder pain and knuckle pain improving with steriods.    Objective: Filed Vitals:   05/18/12 1156 05/18/12 1358 05/18/12 2202 05/19/12 0637  BP: 154/76 144/72 152/75 141/72  Pulse: 69 80 76 69  Temp: 98.2 F (36.8 C) 98.4 F (36.9 C) 98.4 F (36.9 C) 97.8 F (36.6 C)  TempSrc: Oral Oral Oral Oral  Resp: 18 20 18 18   Height:      Weight:    88.2 kg (194 lb 7.1 oz)  SpO2: 93% 95% 93% 92%    Intake/Output Summary (Last 24 hours) at 05/19/12 1031 Last data filed at 05/19/12 0900  Gross per 24 hour  Intake    650 ml  Output   4476 ml  Net  -3826 ml   Filed Weights   05/17/12 2004 05/18/12 0550 05/19/12 0637  Weight: 89.812 kg (198 lb) 90.81 kg (200 lb 3.2 oz) 88.2 kg (194 lb 7.1 oz)    Exam:   General:  NAD  Cardiovascular: RRR.  Respiratory: CTAB.  Abdomen: soft/nt/nd/+bs  Extremities: No clubbing no cyanosis. trace bilateral lower extremity edema.  Data Reviewed: Basic Metabolic Panel:  Recent Labs Lab 05/17/12 2030 05/18/12 0505 05/19/12 0532  NA 134* 133* 136  K 3.4* 3.2* 3.4*  CL 91* 92* 94*  CO2 24 25 25   GLUCOSE 105* 114* 151*  BUN 111* 109* 101*  CREATININE 3.71* 3.58* 3.32*  CALCIUM 8.6 8.3* 9.2   Liver Function Tests:  Recent Labs Lab 05/17/12 2030  AST 18  ALT 12  ALKPHOS 121*  BILITOT 0.6  PROT 7.1  ALBUMIN 3.3*   No results found for this basename: LIPASE, AMYLASE,  in the last 168 hours No results found for this basename: AMMONIA,  in the last 168 hours CBC:  Recent Labs Lab 05/17/12 2030 05/18/12 0505 05/19/12 0532  WBC 27.8* 22.2* 28.5*  NEUTROABS 24.5*  --   --   HGB 13.4 12.5* 13.6  HCT 43.1  41.0 42.6  MCV 74.8* 75.6* 75.1*  PLT 499* 413* 402*   Cardiac Enzymes: No results found for this basename: CKTOTAL, CKMB, CKMBINDEX, TROPONINI,  in the last 168 hours BNP (last 3 results)  Recent Labs  05/18/12 0505  PROBNP 7898.0*   CBG: No results found for this basename: GLUCAP,  in the last 168 hours  Recent Results (from the past 240 hour(s))  URINE CULTURE      Status: None   Collection Time    05/17/12 10:33 PM      Result Value Range Status   Specimen Description URINE, CATHETERIZED   Final   Special Requests NONE   Final   Culture  Setup Time 05/18/2012 01:42   Final   Colony Count NO GROWTH   Final   Culture NO GROWTH   Final   Report Status 05/19/2012 FINAL   Final     Studies: US Renal  05/18/2012  *RADIOLOGY REPORT*  Clinical Data: Elevated BUN and creatinine. History of hypertension.  RENAL/URINARY TRACT ULTRASOUND COMPLETE  Comparison:  None.  Findings:  Right Kidney:  11.5 cm in length.  Parenchyma is echogenic.  A cyst measures 2.2 x 2.5 x 2.5 cm in the lower pole region.  No hydronephrosis.  Left Kidney:  Atrophic, 8.2 cm length.  Renal parenchyma is thinned.  No hydronephrosis or focal mass identified.  Bladder:  Bladder decompressed by a Foley catheter.  Additional findings:  Note is made of marked splenomegaly.  IMPRESSION:  1.  Atrophic left kidney without hydronephrosis or focal mass. 2.  Echogenic renal parenchyma, consistent with intrinsic renal disease. 3.  Right renal cysts without hydronephrosis. 4.  Incidental note of marked splenomegaly.   Original Report Authenticated By: Norva Pavlov, M.D.    Dg Chest Port 1 View  05/19/2012  *RADIOLOGY REPORT*  Clinical Data: Follow up pulmonary edema.  PORTABLE CHEST - 1 VIEW  Comparison: 05/18/2012  Findings: Semi upright portable view of the chest was obtained. There is no focal airspace disease.  Again noted are slightly prominent vascular and interstitial lung markings.  No evidence for frank pulmonary edema.  Heart size is stable and upper limits of normal.  Trachea is midline.  IMPRESSION: No significant pulmonary edema.  No focal airspace disease.   Original Report Authenticated By: Richarda Overlie, M.D.    Dg Chest Port 1 View  05/18/2012  *RADIOLOGY REPORT*  Clinical Data: CHF  PORTABLE CHEST - 1 VIEW  Comparison: None.  Findings: Normal cardiac silhouette and mediastinal contours given  AP projection and decreased lung volumes.  Pulmonary vasculature is indistinct with cephalization of flow.  Minimal bibasilar opacities, left than right.  No definite effusion or pneumothorax. No acute osseous abnormality.  IMPRESSION: Mild pulmonary edema with perihilar and bibasilar atelectasis.   Original Report Authenticated By: Tacey Ruiz, MD     Scheduled Meds: . amLODipine  10 mg Oral QPM  . enoxaparin (LOVENOX) injection  30 mg Subcutaneous QHS  . furosemide  120 mg Intravenous Q8H  . levothyroxine  50 mcg Oral q morning - 10a  . metoprolol  50 mg Oral BID  . predniSONE  40 mg Oral QAC breakfast  . sodium chloride  3 mL Intravenous Q12H   Continuous Infusions:   Principal Problem:   Acute renal failure Active Problems:   Hyperlipidemia   Hypothyroid   ET (essential thrombocythemia)   Hypertension   Hyponatremia   Hypokalemia   Leukocytosis   Polyarthralgia   Volume overload  Time spent: > 35 mins    [facility]  [facility] Pager [phone]. If 7PM-7AM, please contact night-coverage at [email or web address], password [email or web address] 05/19/2012, 10:31 AM  LOS: 2 days

## 2012-05-20 DIAGNOSIS — M109 Gout, unspecified: Secondary | ICD-10-CM | POA: Diagnosis present

## 2012-05-20 LAB — CBC
HCT: 43.5 % (ref 39.0–52.0)
Hemoglobin: 13.6 g/dL (ref 13.0–17.0)
MCV: 74.1 fL — ABNORMAL LOW (ref 78.0–100.0)
RBC: 5.87 MIL/uL — ABNORMAL HIGH (ref 4.22–5.81)
WBC: 33.4 10*3/uL — ABNORMAL HIGH (ref 4.0–10.5)

## 2012-05-20 LAB — BASIC METABOLIC PANEL
BUN: 108 mg/dL — ABNORMAL HIGH (ref 6–23)
CO2: 26 mEq/L (ref 19–32)
Chloride: 91 mEq/L — ABNORMAL LOW (ref 96–112)
Creatinine, Ser: 3.24 mg/dL — ABNORMAL HIGH (ref 0.50–1.35)
GFR calc Af Amer: 21 mL/min — ABNORMAL LOW (ref >90)
Glucose, Bld: 104 mg/dL — ABNORMAL HIGH (ref 70–99)
Potassium: 3.1 mEq/L — ABNORMAL LOW (ref 3.5–5.1)

## 2012-05-20 MED ORDER — POTASSIUM CHLORIDE CRYS ER 20 MEQ PO TBCR
40.0000 meq | EXTENDED_RELEASE_TABLET | ORAL | Status: AC
  Administered 2012-05-20 (×2): 40 meq via ORAL
  Filled 2012-05-20 (×2): qty 2

## 2012-05-20 MED ORDER — POTASSIUM CHLORIDE ER 20 MEQ PO TBCR: 20.0000 meq | EXTENDED_RELEASE_TABLET | Freq: Every day | ORAL | Status: DC

## 2012-05-20 MED ORDER — PREDNISONE 20 MG PO TABS: 40.0000 mg | ORAL_TABLET | Freq: Every day | ORAL | Status: AC

## 2012-05-20 MED ORDER — FUROSEMIDE 80 MG PO TABS: 80.0000 mg | ORAL_TABLET | Freq: Two times a day (BID) | ORAL | Status: DC

## 2012-05-20 NOTE — Discharge Summary (Signed)
Physician Discharge Summary  Kacee Koren WUJ:[id number] DOB: Jun 03, 1940 DOA: 05/17/2012  PCP: Darrick Huntsman  Admit date: 05/17/2012 Discharge date: 05/20/2012  Time spent: 65 minutes  Recommendations for Outpatient Follow-up:  1. Followup with PCP one week post discharge. On followup patient's gout that need to be assessed. Patient will also need a basic metabolic profile done to followup on electrolytes and renal function. 2. Patient is to followup with primary nephrologist one week post discharge. A basic metabolic profile need to be obtained to followup on patient's electrolytes and renal function. Patient has been started on Lasix 80 mg twice daily. Patient is to followup with his appointment for aVF placement next month.  Discharge Diagnoses:  Principal Problem:   Chronic kidney disease (CKD), stage IV (severe) Active Problems:   Hyperlipidemia   Hypothyroid   ET (essential thrombocythemia)   Hypertension   Hyponatremia   Hypokalemia   Leukocytosis   Polyarthralgia   Volume overload   Gout flare   Discharge Condition: Stable and improved  Diet recommendation: Renal diet.  Filed Weights   05/18/12 0550 05/19/12 0637 05/20/12 0608  Weight: 90.81 kg (200 lb 3.2 oz) 88.2 kg (194 lb 7.1 oz) 87.2 kg (192 lb 3.9 oz)    History of present illness:  Pt is 72 yo male with history of HTN, chronic kidney disease, HLD, who presents with main concern of new abnormal kidney function tests that were checked at his PCP office. [facility] [place] and was called at home by his renal specialist that his kidney tests were elevated and was recommended to go to ED for further evaluation. Pt denies chest pain, shortness of breath, no specific abdominal or urinary concerns. He has not noticed significant changes with frequency of voiding. He reports lower extremity swelling but explains it has been rather stable.  In ED, Cr = 3.71, WBC ~ 27,000. [facility] asked to admit for further  evaluation. Dr Hyman Hopes with nephrology called and admission recommended.   Hospital Course:  #1 Progressive chronic kidney disease  Patient was admitted with abnormal labs worrisome for acute on CKD. Patient was initially started on IVF while workup was underway. Patient likely with progressive chronic kidney disease currently stage 4-5. Chest x-ray on admission was consistent with pulmonary edema. IVF were stopped and patient placed on IV lasix. Renal ultrasound was done which showed an atrophic left kidney without hydronephrosis or focal mass. A nephrology consultation was obtained and patient was seen in consultation by Dr. Arlean Hopping of nephrology on 05/18/2012.  It was felt per nephrology the patient had late stage IV borderline stage V chronic kidney disease likely secondary to hypertension. It was felt that patient was volume overloaded with mild pulmonary edema but no significant peripheral edema. Was recommended to diureses patient with IV Lasix until chest x-ray cleared and then resume outpatient Lasix at 80 mg twice daily minimal. Was recommended at that time patient followup for AV cath placement next month and followup with his primary nephrologist at the [facility]. Patient improved clinically, repeat chest x-ray which was done was clear. Patient was subsequently transitioned to oral Lasix at 80 mg twice daily. Patient remained in stable condition will be discharged home in stable and improved condition to followup with his primary nephrologist with the [place]. Patient be discharged in stable and improved condition.  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 Patient is complaining of some local swelling redness and pain in his  left hand as well as right shoulder pain. Patient stated that he also had pain in the arch of his feet and his pain had been moving. It was felt patient's pain was likely secondary to gout. Uric acid and sedimentation rate were  obtained and elevated. Patient was placed on oral prednisone with significant clinical improvement. Patient will followup with PCP as outpatient. Once acute flare has subsided patient may likely benefit from allopurinol.   #3 leukocytosis  Questionable etiology. Likely secondary to gout vs polyarthritis. Patient is currently afebrile. Urinalysis is negative. Chest x-ray is negative for any acute infiltrate. Likely reactive leukocytosis versus secondary to possible inflammatory disease. Uric acid elevated. Sedimentation rate elevated. Patient was started on prednisone. Patient remained afebrile. Patient will followup with PCP as outpatient.  #4 hypertension  Stable. Continued on metoprolol.  #5 hyponatremia  Likely secondary to hypervolemic hyponatremic. Improved with diuretics.  #6 hypokalemia  Secondary to diureses. Replete.  #7 hypothyroidism  Continued on Synthroid at home dose of 50 mcg daily.  #8. Volume overload  Secondary to CKD 4/5. Patient noted to be volume overloaded per chest x-ray on admission which was felt to be secondary to progressive chronic kidney disease stage IV/5. Patient was asymptomatic. Patient was diureses with IV Lasix with resolution of pulmonary edema. Repeat chest x-ray. Patient was subsequently transitioned to oral Lasix. Patient will followup with PCP as outpatient.  #9 Essential thrombocytemia  Stable.   Procedures: Renal ultrasound 05/18/2012   Consultations: Nephrology: Dr. Arlean Hopping 05/18/2012   Discharge Exam: Filed Vitals:   05/19/12 1610 05/19/12 1400 05/19/12 2237 05/20/12 0608  BP: 141/72 168/83 156/85 129/72  Pulse: 69 73 81 60  Temp: 97.8 F (36.6 C) 97.5 F (36.4 C) 98.2 F (36.8 C) 98.3 F (36.8 C)  TempSrc: Oral Oral Oral Oral  Resp: 18 18 18 18   Height:      Weight: 88.2 kg (194 lb 7.1 oz)   87.2 kg (192 lb 3.9 oz)  SpO2: 92% 95% 95% 96%    General: NAD Cardiovascular: RRR  Respiratory: CTAB  Discharge  Instructions  Discharge Orders   Future Orders Complete By Expires     Diet - low sodium heart healthy  As directed     Comments:      Renal Diet 80/2/2    Discharge instructions  As directed     Comments:      Follow up with Nephrologist at [facility] in 1 week. Follow up with Heyat, Perviz in 1 week.    Increase activity slowly  As directed         Medication List    TAKE these medications       amLODipine 10 MG tablet  Commonly known as:  NORVASC  Take 10 mg by mouth every evening.     furosemide 80 MG tablet  Commonly known as:  LASIX  Take 1 tablet (80 mg total) by mouth 2 (two) times daily.     hydrALAZINE 50 MG tablet  Commonly known as:  APRESOLINE  Take 50 mg by mouth 3 (three) times daily.     metoprolol succinate 100 MG 24 hr tablet  Commonly known as:  TOPROL-XL  Take 50 mg by mouth 2 (two) times daily. Take with or immediately following a meal.     Potassium Chloride ER 20 MEQ Tbcr  Take 20 mEq by mouth daily.     predniSONE 20 MG tablet  Commonly known as:  DELTASONE  Take 2 tablets (40 mg  total) by mouth daily before breakfast. Take for 4 days then stop.     SYNTHROID 50 MCG tablet  Generic drug:  levothyroxine  Take 50 mcg by mouth daily before breakfast.           Follow-up Information   Follow up with Heyat, Perviz. Schedule an appointment as soon as possible for a visit in 1 week.   Contact information:   [address] [place] [address] [phone]       Please follow up. ([facility]        The results of significant diagnostics from this hospitalization (including imaging, microbiology, ancillary and laboratory) are listed below for reference.    Significant Diagnostic Studies: US Renal  05/18/2012  *RADIOLOGY REPORT*  Clinical Data: Elevated BUN and creatinine. History of hypertension.  RENAL/URINARY TRACT ULTRASOUND COMPLETE  Comparison:  None.  Findings:  Right Kidney:  11.5 cm in length.  Parenchyma  is echogenic.  A cyst measures 2.2 x 2.5 x 2.5 cm in the lower pole region.  No hydronephrosis.  Left Kidney:  Atrophic, 8.2 cm length.  Renal parenchyma is thinned.  No hydronephrosis or focal mass identified.  Bladder:  Bladder decompressed by a Foley catheter.  Additional findings:  Note is made of marked splenomegaly.  IMPRESSION:  1.  Atrophic left kidney without hydronephrosis or focal mass. 2.  Echogenic renal parenchyma, consistent with intrinsic renal disease. 3.  Right renal cysts without hydronephrosis. 4.  Incidental note of marked splenomegaly.   Original Report Authenticated By: Norva Pavlov, M.D.    Dg Chest Port 1 View  05/19/2012  *RADIOLOGY REPORT*  Clinical Data: Follow up pulmonary edema.  PORTABLE CHEST - 1 VIEW  Comparison: 05/18/2012  Findings: Semi upright portable view of the chest was obtained. There is no focal airspace disease.  Again noted are slightly prominent vascular and interstitial lung markings.  No evidence for frank pulmonary edema.  Heart size is stable and upper limits of normal.  Trachea is midline.  IMPRESSION: No significant pulmonary edema.  No focal airspace disease.   Original Report Authenticated By: Richarda Overlie, M.D.    Dg Chest Port 1 View  05/18/2012  *RADIOLOGY REPORT*  Clinical Data: CHF  PORTABLE CHEST - 1 VIEW  Comparison: None.  Findings: Normal cardiac silhouette and mediastinal contours given AP projection and decreased lung volumes.  Pulmonary vasculature is indistinct with cephalization of flow.  Minimal bibasilar opacities, left than right.  No definite effusion or pneumothorax. No acute osseous abnormality.  IMPRESSION: Mild pulmonary edema with perihilar and bibasilar atelectasis.   Original Report Authenticated By: Tacey Ruiz, MD     Microbiology: Recent Results (from the past 240 hour(s))  URINE CULTURE     Status: None   Collection Time    05/17/12 10:33 PM      Result Value Range Status   Specimen Description URINE, CATHETERIZED    Final   Special Requests NONE   Final   Culture  Setup Time 05/18/2012 01:42   Final   Colony Count NO GROWTH   Final   Culture NO GROWTH   Final   Report Status 05/19/2012 FINAL   Final     Labs: Basic Metabolic Panel:  Recent Labs Lab 05/17/12 2030 05/18/12 0505 05/19/12 0532 05/20/12 0533  NA 134* 133* 136 135  K 3.4* 3.2* 3.4* 3.1*  CL 91* 92* 94* 91*  CO2 24 25 25 26   GLUCOSE 105* 114* 151*  104*  BUN 111* 109* 101* 108*  CREATININE 3.71* 3.58* 3.32* 3.24*  CALCIUM 8.6 8.3* 9.2 9.3   Liver Function Tests:  Recent Labs Lab 05/17/12 2030  AST 18  ALT 12  ALKPHOS 121*  BILITOT 0.6  PROT 7.1  ALBUMIN 3.3*   No results found for this basename: LIPASE, AMYLASE,  in the last 168 hours No results found for this basename: AMMONIA,  in the last 168 hours CBC:  Recent Labs Lab 05/17/12 2030 05/18/12 0505 05/19/12 0532 05/20/12 0533  WBC 27.8* 22.2* 28.5* 33.4*  NEUTROABS 24.5*  --   --   --   HGB 13.4 12.5* 13.6 13.6  HCT 43.1 41.0 42.6 43.5  MCV 74.8* 75.6* 75.1* 74.1*  PLT 499* 413* 402* 468*   Cardiac Enzymes: No results found for this basename: CKTOTAL, CKMB, CKMBINDEX, TROPONINI,  in the last 168 hours BNP: BNP (last 3 results)  Recent Labs  05/18/12 0505  PROBNP 7898.0*   CBG: No results found for this basename: GLUCAP,  in the last 168 hours     Signed:  Crystian Frith  [facility] 05/20/2012, 12:40 PM

## 2012-05-20 NOTE — Progress Notes (Signed)
DC instructions gone over with patient, verbalized understanding.  Patient is going to call nurse at nephrologists office for follow-up appointment.  Discussed the importance of follow-up which patient states he fully understands.  Rx given for Prednisone, Lasix, and potassium.  Transported to front of hospital via wheelchair by nurse tech for patient to drive self home.

## 2013-08-01 ENCOUNTER — Other Ambulatory Visit: Payer: Self-pay | Admitting: *Deleted

## 2013-08-01 DIAGNOSIS — L97909 Non-pressure chronic ulcer of unspecified part of unspecified lower leg with unspecified severity: Secondary | ICD-10-CM

## 2013-08-01 DIAGNOSIS — R609 Edema, unspecified: Secondary | ICD-10-CM

## 2013-09-06 ENCOUNTER — Encounter: Payer: Non-veteran care | Admitting: Vascular Surgery

## 2013-09-06 ENCOUNTER — Encounter (HOSPITAL_COMMUNITY): Payer: Non-veteran care

## 2015-04-03 ENCOUNTER — Emergency Department (HOSPITAL_COMMUNITY): Payer: Medicare Other

## 2015-04-03 ENCOUNTER — Inpatient Hospital Stay (HOSPITAL_COMMUNITY): Payer: Medicare Other

## 2015-04-03 ENCOUNTER — Encounter (HOSPITAL_COMMUNITY): Payer: Self-pay | Admitting: Emergency Medicine

## 2015-04-03 ENCOUNTER — Inpatient Hospital Stay (HOSPITAL_COMMUNITY)
Admission: EM | Admit: 2015-04-03 | Discharge: 2015-04-16 | DRG: 871 | Disposition: A | Discharge status: Home Health | Payer: Medicare Other | Attending: Internal Medicine | Admitting: Internal Medicine

## 2015-04-03 DIAGNOSIS — N186 End stage renal disease: Secondary | ICD-10-CM | POA: Diagnosis present

## 2015-04-03 DIAGNOSIS — E872 Acidosis: Secondary | ICD-10-CM | POA: Diagnosis present

## 2015-04-03 DIAGNOSIS — J810 Acute pulmonary edema: Secondary | ICD-10-CM | POA: Diagnosis not present

## 2015-04-03 DIAGNOSIS — E875 Hyperkalemia: Secondary | ICD-10-CM | POA: Diagnosis present

## 2015-04-03 DIAGNOSIS — Z452 Encounter for adjustment and management of vascular access device: Secondary | ICD-10-CM

## 2015-04-03 DIAGNOSIS — Z681 Body mass index (BMI) 19 or less, adult: Secondary | ICD-10-CM | POA: Diagnosis not present

## 2015-04-03 DIAGNOSIS — J90 Pleural effusion, not elsewhere classified: Secondary | ICD-10-CM | POA: Diagnosis not present

## 2015-04-03 DIAGNOSIS — A419 Sepsis, unspecified organism: Principal | ICD-10-CM | POA: Diagnosis present

## 2015-04-03 DIAGNOSIS — E43 Unspecified severe protein-calorie malnutrition: Secondary | ICD-10-CM | POA: Diagnosis present

## 2015-04-03 DIAGNOSIS — I270 Primary pulmonary hypertension: Secondary | ICD-10-CM | POA: Diagnosis not present

## 2015-04-03 DIAGNOSIS — T380X5A Adverse effect of glucocorticoids and synthetic analogues, initial encounter: Secondary | ICD-10-CM | POA: Diagnosis not present

## 2015-04-03 DIAGNOSIS — M7989 Other specified soft tissue disorders: Secondary | ICD-10-CM | POA: Diagnosis not present

## 2015-04-03 DIAGNOSIS — R570 Cardiogenic shock: Secondary | ICD-10-CM | POA: Diagnosis not present

## 2015-04-03 DIAGNOSIS — L89112 Pressure ulcer of right upper back, stage 2: Secondary | ICD-10-CM | POA: Diagnosis present

## 2015-04-03 DIAGNOSIS — I071 Rheumatic tricuspid insufficiency: Secondary | ICD-10-CM | POA: Diagnosis present

## 2015-04-03 DIAGNOSIS — I9589 Other hypotension: Secondary | ICD-10-CM | POA: Diagnosis present

## 2015-04-03 DIAGNOSIS — Z992 Dependence on renal dialysis: Secondary | ICD-10-CM | POA: Diagnosis not present

## 2015-04-03 DIAGNOSIS — S50312A Abrasion of left elbow, initial encounter: Secondary | ICD-10-CM | POA: Diagnosis present

## 2015-04-03 DIAGNOSIS — D638 Anemia in other chronic diseases classified elsewhere: Secondary | ICD-10-CM | POA: Diagnosis present

## 2015-04-03 DIAGNOSIS — R188 Other ascites: Secondary | ICD-10-CM | POA: Diagnosis not present

## 2015-04-03 DIAGNOSIS — D72829 Elevated white blood cell count, unspecified: Secondary | ICD-10-CM | POA: Diagnosis not present

## 2015-04-03 DIAGNOSIS — I509 Heart failure, unspecified: Secondary | ICD-10-CM | POA: Diagnosis not present

## 2015-04-03 DIAGNOSIS — Z87891 Personal history of nicotine dependence: Secondary | ICD-10-CM

## 2015-04-03 DIAGNOSIS — I132 Hypertensive heart and chronic kidney disease with heart failure and with stage 5 chronic kidney disease, or end stage renal disease: Secondary | ICD-10-CM | POA: Diagnosis present

## 2015-04-03 DIAGNOSIS — K7460 Unspecified cirrhosis of liver: Secondary | ICD-10-CM

## 2015-04-03 DIAGNOSIS — R739 Hyperglycemia, unspecified: Secondary | ICD-10-CM | POA: Diagnosis not present

## 2015-04-03 DIAGNOSIS — I5033 Acute on chronic diastolic (congestive) heart failure: Secondary | ICD-10-CM | POA: Diagnosis present

## 2015-04-03 DIAGNOSIS — D473 Essential (hemorrhagic) thrombocythemia: Secondary | ICD-10-CM | POA: Diagnosis present

## 2015-04-03 DIAGNOSIS — N2581 Secondary hyperparathyroidism of renal origin: Secondary | ICD-10-CM | POA: Diagnosis present

## 2015-04-03 DIAGNOSIS — E039 Hypothyroidism, unspecified: Secondary | ICD-10-CM | POA: Diagnosis present

## 2015-04-03 DIAGNOSIS — R6521 Severe sepsis with septic shock: Secondary | ICD-10-CM

## 2015-04-03 DIAGNOSIS — I4891 Unspecified atrial fibrillation: Secondary | ICD-10-CM | POA: Diagnosis not present

## 2015-04-03 DIAGNOSIS — T82510A Breakdown (mechanical) of surgically created arteriovenous fistula, initial encounter: Secondary | ICD-10-CM | POA: Diagnosis present

## 2015-04-03 DIAGNOSIS — I482 Chronic atrial fibrillation: Secondary | ICD-10-CM | POA: Diagnosis not present

## 2015-04-03 DIAGNOSIS — I350 Nonrheumatic aortic (valve) stenosis: Secondary | ICD-10-CM | POA: Diagnosis present

## 2015-04-03 DIAGNOSIS — G629 Polyneuropathy, unspecified: Secondary | ICD-10-CM | POA: Diagnosis present

## 2015-04-03 DIAGNOSIS — E785 Hyperlipidemia, unspecified: Secondary | ICD-10-CM | POA: Diagnosis present

## 2015-04-03 DIAGNOSIS — L8990 Pressure ulcer of unspecified site, unspecified stage: Secondary | ICD-10-CM | POA: Diagnosis present

## 2015-04-03 DIAGNOSIS — I272 Other secondary pulmonary hypertension: Secondary | ICD-10-CM | POA: Diagnosis present

## 2015-04-03 DIAGNOSIS — E2740 Unspecified adrenocortical insufficiency: Secondary | ICD-10-CM | POA: Diagnosis present

## 2015-04-03 DIAGNOSIS — J9601 Acute respiratory failure with hypoxia: Secondary | ICD-10-CM | POA: Diagnosis not present

## 2015-04-03 DIAGNOSIS — R0600 Dyspnea, unspecified: Secondary | ICD-10-CM

## 2015-04-03 DIAGNOSIS — J9600 Acute respiratory failure, unspecified whether with hypoxia or hypercapnia: Secondary | ICD-10-CM | POA: Insufficient documentation

## 2015-04-03 DIAGNOSIS — R579 Shock, unspecified: Secondary | ICD-10-CM | POA: Diagnosis not present

## 2015-04-03 DIAGNOSIS — Z79899 Other long term (current) drug therapy: Secondary | ICD-10-CM

## 2015-04-03 HISTORY — DX: Personal history of diseases of the blood and blood-forming organs and certain disorders involving the immune mechanism: Z86.2

## 2015-04-03 LAB — COMPREHENSIVE METABOLIC PANEL
ALK PHOS: 66 U/L (ref 38–126)
ALK PHOS: 60 U/L (ref 38–126)
ALT: 13 U/L — AB (ref 17–63)
ALT: 10 U/L — AB (ref 17–63)
AST: 17 U/L (ref 15–41)
AST: 15 U/L (ref 15–41)
Albumin: 2.7 g/dL — ABNORMAL LOW (ref 3.5–5.0)
Albumin: 2.4 g/dL — ABNORMAL LOW (ref 3.5–5.0)
Anion gap: 22 — ABNORMAL HIGH (ref 5–15)
Anion gap: 21 — ABNORMAL HIGH (ref 5–15)
BILIRUBIN TOTAL: 0.6 mg/dL (ref 0.3–1.2)
BUN: 126 mg/dL — AB (ref 6–20)
BUN: 120 mg/dL — AB (ref 6–20)
CALCIUM: 7.4 mg/dL — AB (ref 8.9–10.3)
CALCIUM: 6.7 mg/dL — AB (ref 8.9–10.3)
CHLORIDE: 105 mmol/L (ref 101–111)
CHLORIDE: 108 mmol/L (ref 101–111)
CO2: 14 mmol/L — ABNORMAL LOW (ref 22–32)
CO2: 13 mmol/L — ABNORMAL LOW (ref 22–32)
CREATININE: 12.98 mg/dL — AB (ref 0.61–1.24)
CREATININE: 11.79 mg/dL — AB (ref 0.61–1.24)
GFR calc Af Amer: 4 mL/min — ABNORMAL LOW (ref >60)
GFR, EST AFRICAN AMERICAN: 4 mL/min — AB (ref >60)
GFR, EST NON AFRICAN AMERICAN: 3 mL/min — AB (ref >60)
GFR, EST NON AFRICAN AMERICAN: 4 mL/min — AB (ref >60)
Glucose, Bld: 90 mg/dL (ref 65–99)
Glucose, Bld: 66 mg/dL (ref 65–99)
Potassium: 5 mmol/L (ref 3.5–5.1)
Potassium: 5.2 mmol/L — ABNORMAL HIGH (ref 3.5–5.1)
Sodium: 141 mmol/L (ref 135–145)
Sodium: 142 mmol/L (ref 135–145)
TOTAL PROTEIN: 5.3 g/dL — AB (ref 6.5–8.1)
Total Bilirubin: 0.7 mg/dL (ref 0.3–1.2)
Total Protein: 4.4 g/dL — ABNORMAL LOW (ref 6.5–8.1)

## 2015-04-03 LAB — CBC WITH DIFFERENTIAL/PLATELET
Basophils Absolute: 0 10*3/uL (ref 0.0–0.1)
Basophils Relative: 0 %
EOS PCT: 1 %
Eosinophils Absolute: 0.1 10*3/uL (ref 0.0–0.7)
HEMATOCRIT: 35.1 % — AB (ref 39.0–52.0)
Hemoglobin: 11.3 g/dL — ABNORMAL LOW (ref 13.0–17.0)
LYMPHS ABS: 2.2 10*3/uL (ref 0.7–4.0)
LYMPHS PCT: 9 %
MCH: 31 pg (ref 26.0–34.0)
MCHC: 32.2 g/dL (ref 30.0–36.0)
MCV: 96.2 fL (ref 78.0–100.0)
MONO ABS: 0.4 10*3/uL (ref 0.1–1.0)
Monocytes Relative: 2 %
NEUTROS ABS: 21.8 10*3/uL — AB (ref 1.7–7.7)
Neutrophils Relative %: 88 %
PLATELETS: 191 10*3/uL (ref 150–400)
RBC: 3.65 MIL/uL — AB (ref 4.22–5.81)
RDW: 18.8 % — ABNORMAL HIGH (ref 11.5–15.5)
WBC: 24.6 10*3/uL — AB (ref 4.0–10.5)

## 2015-04-03 LAB — APTT: APTT: 32 s (ref 24–37)

## 2015-04-03 LAB — PROTIME-INR
INR: 1.36 (ref 0.00–1.49)
Prothrombin Time: 16.8 seconds — ABNORMAL HIGH (ref 11.6–15.2)

## 2015-04-03 LAB — CBC
HCT: 33.3 % — ABNORMAL LOW (ref 39.0–52.0)
Hemoglobin: 10.3 g/dL — ABNORMAL LOW (ref 13.0–17.0)
MCH: 29.8 pg (ref 26.0–34.0)
MCHC: 30.9 g/dL (ref 30.0–36.0)
MCV: 96.2 fL (ref 78.0–100.0)
PLATELETS: 185 10*3/uL (ref 150–400)
RBC: 3.46 MIL/uL — ABNORMAL LOW (ref 4.22–5.81)
RDW: 18.7 % — ABNORMAL HIGH (ref 11.5–15.5)
WBC: 21.1 10*3/uL — ABNORMAL HIGH (ref 4.0–10.5)

## 2015-04-03 LAB — I-STAT CG4 LACTIC ACID, ED
LACTIC ACID, VENOUS: 1.56 mmol/L (ref 0.5–2.0)
LACTIC ACID, VENOUS: 1.57 mmol/L (ref 0.5–2.0)

## 2015-04-03 LAB — PHOSPHORUS: Phosphorus: 10.8 mg/dL — ABNORMAL HIGH (ref 2.5–4.6)

## 2015-04-03 LAB — I-STAT CHEM 8, ED
BUN: 130 mg/dL — ABNORMAL HIGH (ref 6–20)
CALCIUM ION: 0.88 mmol/L — AB (ref 1.13–1.30)
CREATININE: 12.1 mg/dL — AB (ref 0.61–1.24)
Chloride: 106 mmol/L (ref 101–111)
GLUCOSE: 83 mg/dL (ref 65–99)
HCT: 37 % — ABNORMAL LOW (ref 39.0–52.0)
Hemoglobin: 12.6 g/dL — ABNORMAL LOW (ref 13.0–17.0)
Potassium: 4.7 mmol/L (ref 3.5–5.1)
Sodium: 139 mmol/L (ref 135–145)
TCO2: 17 mmol/L (ref 0–100)

## 2015-04-03 LAB — TROPONIN I
TROPONIN I: 0.13 ng/mL — AB (ref <0.031)
Troponin I: 0.09 ng/mL — ABNORMAL HIGH (ref <0.031)

## 2015-04-03 LAB — TYPE AND SCREEN
ABO/RH(D): O POS
ANTIBODY SCREEN: NEGATIVE

## 2015-04-03 LAB — I-STAT VENOUS BLOOD GAS, ED
ACID-BASE DEFICIT: 13 mmol/L — AB (ref 0.0–2.0)
Bicarbonate: 15.1 mEq/L — ABNORMAL LOW (ref 20.0–24.0)
O2 SAT: 98 %
PH VEN: 7.17 — AB (ref 7.250–7.300)
TCO2: 16 mmol/L (ref 0–100)
pCO2, Ven: 41.5 mmHg — ABNORMAL LOW (ref 45.0–50.0)
pO2, Ven: 124 mmHg — ABNORMAL HIGH (ref 30.0–45.0)

## 2015-04-03 LAB — TSH: TSH: 8.421 u[IU]/mL — ABNORMAL HIGH (ref 0.350–4.500)

## 2015-04-03 LAB — CBG MONITORING, ED: Glucose-Capillary: 75 mg/dL (ref 65–99)

## 2015-04-03 LAB — ABO/RH: ABO/RH(D): O POS

## 2015-04-03 LAB — CORTISOL: CORTISOL PLASMA: 11.8 ug/dL

## 2015-04-03 LAB — MAGNESIUM: Magnesium: 2.1 mg/dL (ref 1.7–2.4)

## 2015-04-03 LAB — PROCALCITONIN: PROCALCITONIN: 0.59 ng/mL

## 2015-04-03 MED ORDER — HEPARIN SODIUM (PORCINE) 5000 UNIT/ML IJ SOLN
5000.0000 [IU] | Freq: Three times a day (TID) | INTRAMUSCULAR | Status: DC
  Administered 2015-04-03 – 2015-04-13 (×29): 5000 [IU] via SUBCUTANEOUS
  Filled 2015-04-03 (×31): qty 1

## 2015-04-03 MED ORDER — ASPIRIN 300 MG RE SUPP: 300.0000 mg | RECTAL | Status: AC

## 2015-04-03 MED ORDER — SODIUM CHLORIDE 0.9 % FOR CRRT
INTRAVENOUS_CENTRAL | Status: DC | PRN
  Administered 2015-04-08: 15:00:00 via INTRAVENOUS_CENTRAL
  Filled 2015-04-03 (×2): qty 1000

## 2015-04-03 MED ORDER — CALCIUM ACETATE (PHOS BINDER) 667 MG PO CAPS
1334.0000 mg | ORAL_CAPSULE | Freq: Every day | ORAL | Status: DC
  Administered 2015-04-03 – 2015-04-08 (×6): 1334 mg via ORAL
  Filled 2015-04-03 (×8): qty 2

## 2015-04-03 MED ORDER — PRISMASOL BGK 4/2.5 32-4-2.5 MEQ/L IV SOLN
INTRAVENOUS | Status: DC
  Administered 2015-04-03 – 2015-04-10 (×8): via INTRAVENOUS_CENTRAL
  Filled 2015-04-03 (×9): qty 5000

## 2015-04-03 MED ORDER — SODIUM CHLORIDE 0.9 % IV SOLN
INTRAVENOUS | Status: DC
  Administered 2015-04-03: 50 mL/h via INTRAVENOUS

## 2015-04-03 MED ORDER — PIPERACILLIN-TAZOBACTAM 3.375 G IVPB 30 MIN
3.3750 g | Freq: Four times a day (QID) | INTRAVENOUS | Status: DC
  Administered 2015-04-03 – 2015-04-11 (×31): 3.375 g via INTRAVENOUS
  Filled 2015-04-03 (×35): qty 50

## 2015-04-03 MED ORDER — LIDOCAINE-PRILOCAINE 2.5-2.5 % EX CREA: 1.0000 "application " | TOPICAL_CREAM | CUTANEOUS | Status: DC | PRN

## 2015-04-03 MED ORDER — ALTEPLASE 2 MG IJ SOLR
2.0000 mg | Freq: Once | INTRAMUSCULAR | Status: DC | PRN
  Filled 2015-04-03: qty 2

## 2015-04-03 MED ORDER — LIDOCAINE HCL (PF) 1 % IJ SOLN: 5.0000 mL | INTRAMUSCULAR | Status: DC | PRN

## 2015-04-03 MED ORDER — HEPARIN SODIUM (PORCINE) 1000 UNIT/ML DIALYSIS: 1000.0000 [IU] | INTRAMUSCULAR | Status: DC | PRN

## 2015-04-03 MED ORDER — CETYLPYRIDINIUM CHLORIDE 0.05 % MT LIQD
7.0000 mL | Freq: Two times a day (BID) | OROMUCOSAL | Status: DC
  Administered 2015-04-04 – 2015-04-08 (×8): 7 mL via OROMUCOSAL

## 2015-04-03 MED ORDER — VANCOMYCIN HCL IN DEXTROSE 1-5 GM/200ML-% IV SOLN: 1000.0000 mg | Freq: Once | INTRAVENOUS | Status: DC

## 2015-04-03 MED ORDER — SODIUM CHLORIDE 0.9 % IV SOLN
250.0000 mL | INTRAVENOUS | Status: DC | PRN
  Administered 2015-04-07 (×2): 250 mL via INTRAVENOUS

## 2015-04-03 MED ORDER — VANCOMYCIN HCL 10 G IV SOLR
1500.0000 mg | Freq: Once | INTRAVENOUS | Status: AC
  Administered 2015-04-03: 1500 mg via INTRAVENOUS
  Filled 2015-04-03: qty 1500

## 2015-04-03 MED ORDER — SODIUM CHLORIDE 0.9 % IV SOLN
Freq: Once | INTRAVENOUS | Status: AC
  Administered 2015-04-03: 15:00:00 via INTRAVENOUS

## 2015-04-03 MED ORDER — SODIUM CHLORIDE 0.9 % IV SOLN: 100.0000 mL | INTRAVENOUS | Status: DC | PRN

## 2015-04-03 MED ORDER — PIPERACILLIN-TAZOBACTAM IN DEX 2-0.25 GM/50ML IV SOLN
2.2500 g | Freq: Three times a day (TID) | INTRAVENOUS | Status: DC
  Filled 2015-04-03 (×2): qty 50

## 2015-04-03 MED ORDER — HEPARIN SODIUM (PORCINE) 1000 UNIT/ML DIALYSIS: 1700.0000 [IU] | INTRAMUSCULAR | Status: DC | PRN

## 2015-04-03 MED ORDER — HEPARIN SODIUM (PORCINE) 1000 UNIT/ML DIALYSIS
1000.0000 [IU] | INTRAMUSCULAR | Status: DC | PRN
  Administered 2015-04-08 – 2015-04-11 (×2): 2400 [IU] via INTRAVENOUS_CENTRAL
  Filled 2015-04-03 (×3): qty 6

## 2015-04-03 MED ORDER — VANCOMYCIN HCL IN DEXTROSE 1-5 GM/200ML-% IV SOLN
1000.0000 mg | INTRAVENOUS | Status: DC
  Administered 2015-04-04 – 2015-04-05 (×2): 1000 mg via INTRAVENOUS
  Filled 2015-04-03 (×3): qty 200

## 2015-04-03 MED ORDER — DOXERCALCIFEROL 4 MCG/2ML IV SOLN
1.0000 ug | INTRAVENOUS | Status: DC
  Administered 2015-04-16: 1 ug via INTRAVENOUS
  Filled 2015-04-03 (×6): qty 2

## 2015-04-03 MED ORDER — PRISMASOL BGK 4/2.5 32-4-2.5 MEQ/L IV SOLN
INTRAVENOUS | Status: DC
  Administered 2015-04-03 – 2015-04-11 (×52): via INTRAVENOUS_CENTRAL
  Filled 2015-04-03 (×68): qty 5000

## 2015-04-03 MED ORDER — SODIUM CHLORIDE 0.9 % IV BOLUS (SEPSIS)
1000.0000 mL | Freq: Once | INTRAVENOUS | Status: AC
  Administered 2015-04-03: 1000 mL via INTRAVENOUS

## 2015-04-03 MED ORDER — MIDODRINE HCL 5 MG PO TABS
20.0000 mg | ORAL_TABLET | Freq: Two times a day (BID) | ORAL | Status: DC
  Administered 2015-04-04: 20 mg via ORAL
  Filled 2015-04-03 (×2): qty 4

## 2015-04-03 MED ORDER — PENTAFLUOROPROP-TETRAFLUOROETH EX AERO: 1.0000 "application " | INHALATION_SPRAY | CUTANEOUS | Status: DC | PRN

## 2015-04-03 MED ORDER — LEVOTHYROXINE SODIUM 50 MCG PO TABS
50.0000 ug | ORAL_TABLET | Freq: Every day | ORAL | Status: DC
  Administered 2015-04-04 – 2015-04-16 (×13): 50 ug via ORAL
  Filled 2015-04-03 (×14): qty 1

## 2015-04-03 MED ORDER — PRISMASOL BGK 4/2.5 32-4-2.5 MEQ/L IV SOLN
INTRAVENOUS | Status: DC
  Administered 2015-04-03 – 2015-04-11 (×15): via INTRAVENOUS_CENTRAL
  Filled 2015-04-03 (×17): qty 5000

## 2015-04-03 MED ORDER — ALTEPLASE 2 MG IJ SOLR: 2.0000 mg | Freq: Once | INTRAMUSCULAR | Status: DC | PRN

## 2015-04-03 MED ORDER — ASPIRIN 81 MG PO CHEW
324.0000 mg | CHEWABLE_TABLET | ORAL | Status: AC
  Administered 2015-04-03: 324 mg via ORAL
  Filled 2015-04-03: qty 4

## 2015-04-03 MED ORDER — NOREPINEPHRINE BITARTRATE 1 MG/ML IV SOLN
5.0000 ug/min | INTRAVENOUS | Status: DC
  Administered 2015-04-03: 5 ug/min via INTRAVENOUS
  Filled 2015-04-03: qty 4

## 2015-04-03 MED ORDER — PIPERACILLIN-TAZOBACTAM 3.375 G IVPB 30 MIN: 3.3750 g | Freq: Once | INTRAVENOUS | Status: DC

## 2015-04-03 MED ORDER — HEPARIN SODIUM (PORCINE) 1000 UNIT/ML DIALYSIS: 20.0000 [IU]/kg | INTRAMUSCULAR | Status: DC | PRN

## 2015-04-03 MED ORDER — SODIUM CHLORIDE 0.9 % IV BOLUS (SEPSIS): 500.0000 mL | INTRAVENOUS | Status: AC

## 2015-04-03 MED ORDER — SODIUM CHLORIDE 0.9 % IV BOLUS (SEPSIS)
1000.0000 mL | INTRAVENOUS | Status: AC
Start: 2015-04-03 — End: 2015-04-03

## 2015-04-03 MED ORDER — NOREPINEPHRINE BITARTRATE 1 MG/ML IV SOLN
5.0000 ug/min | INTRAVENOUS | Status: DC
  Administered 2015-04-03: 18 ug/min via INTRAVENOUS
  Administered 2015-04-04: 9 ug/min via INTRAVENOUS
  Administered 2015-04-06: 7 ug/min via INTRAVENOUS
  Filled 2015-04-03 (×6): qty 16

## 2015-04-03 MED ORDER — CHLORHEXIDINE GLUCONATE 0.12 % MT SOLN
15.0000 mL | Freq: Two times a day (BID) | OROMUCOSAL | Status: DC
  Administered 2015-04-04 – 2015-04-08 (×11): 15 mL via OROMUCOSAL
  Filled 2015-04-03 (×2): qty 15

## 2015-04-03 NOTE — ED Notes (Signed)
Patient transported to X-ray 

## 2015-04-03 NOTE — ED Notes (Addendum)
Marni Griffon, NP with critical care advised to give patient 532ml normal saline and he would check back in a few minutes to discuss what the next step in patients plan of care would be.

## 2015-04-03 NOTE — ED Notes (Signed)
Pt arrives via [facility] from the vascular center, pt does dialysis t, th, s, missed his appt yesterday, ems was called out for pt being hypotensive. Pt denies any complaints. Ems reports patient was 88% o2 sat on ra. Pt a/o x4, speaking in complete sentences.

## 2015-04-03 NOTE — ED Notes (Signed)
Erik Griffon, NP at bedside to place central line.

## 2015-04-03 NOTE — ED Provider Notes (Signed)
CSN: [id number]     Arrival date & time 04/03/15  [id number] History   First MD Initiated Contact with Patient 04/03/15 1001     Chief Complaint  Patient presents with  . Hypotension     (Consider location/radiation/quality/duration/timing/severity/associated sxs/prior Treatment) HPI Comments: The patient is a 75 year old male, he has a history of hypertension, end-stage renal disease, congestive heart failure, atrial fibrillation, pulmonary hypertension and states that he gets paracentesis every 2 weeks at the [facility] in [place]. He was supposed to have dialysis yesterday but states that he over slept, he was seen at the vascular center today to have some revision done on his left forearm graft, he states that he did not have good flow in it, there were able to fix that this morning but during this noticed that the patient was hypotensive and hypoxic in the sent to the emergency department for further evaluation. The patient denies any symptoms including shortness of breath, chest pain, back pain, weakness, numbness, confusion, blurred vision or any other complaints.  The history is provided by the patient.    Past Medical History  Diagnosis Date  . Hypertension   . Renal disorder   . ET (essential thrombocythemia) ([place])   . Hypothyroid   . CHF (congestive heart failure) ([place])   . A-fib ([place])   . Hyperlipidemia   . Pulmonary hypertension ([place])   . Nephrolithiasis    Past Surgical History  Procedure Laterality Date  . Amputation  05/14/2011    Procedure: AMPUTATION DIGIT;  Surgeon: Dennie Bible, MD;  Location: [facility];  Service: Plastics;  Laterality: Right;  right index finger attempted revascularization   Family History  Problem Relation Age of Onset  . Cancer Mother   . Cancer Brother    Social History  Substance Use Topics  . Smoking status: Never Smoker   . Smokeless tobacco: Former Systems developer  . Alcohol Use: No    Review of Systems  All other systems  reviewed and are negative.     Allergies  Review of patient's allergies indicates no known allergies.  Home Medications   Prior to Admission medications   Medication Sig Start Date End Date Taking? Authorizing Provider  amLODipine (NORVASC) 10 MG tablet Take 10 mg by mouth every evening. In am TTS   Yes Historical Provider, MD  calcium acetate (PHOSLO) 667 MG capsule Take 1,334 mg by mouth every evening.   Yes Historical Provider, MD  Febuxostat 80 MG TABS Take 80 mg by mouth daily.   Yes Historical Provider, MD  GABAPENTIN PO Take 2 capsules by mouth daily. Unknown dose   Yes Historical Provider, MD  levothyroxine (SYNTHROID) 50 MCG tablet Take 50 mcg by mouth daily before breakfast.   Yes Historical Provider, MD  furosemide (LASIX) 80 MG tablet Take 1 tablet (80 mg total) by mouth 2 (two) times daily. Patient not taking: Reported on 04/03/2015 05/20/12   Eugenie Filler, MD  hydrALAZINE (APRESOLINE) 50 MG tablet Take 50 mg by mouth 3 (three) times daily. Reported on 04/03/2015    Historical Provider, MD  metoprolol succinate (TOPROL-XL) 100 MG 24 hr tablet Take 50 mg by mouth 2 (two) times daily. Reported on 04/03/2015    Historical Provider, MD  potassium chloride 20 MEQ TBCR Take 20 mEq by mouth daily. Patient not taking: Reported on 04/03/2015 05/20/12   Eugenie Filler, MD   BP 67/50 mmHg  Pulse 67  Temp(Src) 96.3 F (35.7 C) (Rectal)  Resp 19  SpO2 94% Physical Exam  Constitutional: He appears well-developed and well-nourished. No distress.  HENT:  Head: Normocephalic and atraumatic.  Mouth/Throat: Oropharynx is clear and moist. No oropharyngeal exudate.  Eyes: Conjunctivae and EOM are normal. Pupils are equal, round, and reactive to light. Right eye exhibits no discharge. Left eye exhibits no discharge. No scleral icterus.  Neck: Normal range of motion. Neck supple. No JVD present. No thyromegaly present.  Cardiovascular: Normal rate, normal heart sounds and intact distal  pulses.  Exam reveals no gallop and no friction rub.   No murmur heard. Irregularly irregular rhythm  Pulmonary/Chest: Effort normal. No respiratory distress. He has no wheezes. He has rales ( Subtle rales at the bases).  Abdominal: Soft. Bowel sounds are normal. He exhibits distension ( Soft, distended, no tenderness, mild fluid wave). He exhibits no mass. There is no tenderness.  Musculoskeletal: Normal range of motion. He exhibits edema ( Asymmetric edema of the lower extremities). He exhibits no tenderness.  Lymphadenopathy:    He has no cervical adenopathy.  Neurological: He is alert. Coordination normal.  Skin: Skin is warm and dry. No rash noted. No erythema.  Psychiatric: He has a normal mood and affect. His behavior is normal.  Nursing note and vitals reviewed.   ED Course  Procedures (including critical care time) Labs Review Labs Reviewed  CBC WITH DIFFERENTIAL/PLATELET - Abnormal; Notable for the following:    WBC 24.6 (*)    RBC 3.65 (*)    Hemoglobin 11.3 (*)    HCT 35.1 (*)    RDW 18.8 (*)    Neutro Abs 21.8 (*)    All other components within normal limits  COMPREHENSIVE METABOLIC PANEL - Abnormal; Notable for the following:    CO2 14 (*)    BUN 126 (*)    Creatinine, Ser 12.98 (*)    Calcium 7.4 (*)    Total Protein 5.3 (*)    Albumin 2.7 (*)    ALT 13 (*)    GFR calc non Af Amer 3 (*)    GFR calc Af Amer 4 (*)    Anion gap 22 (*)    All other components within normal limits  TROPONIN I - Abnormal; Notable for the following:    Troponin I 0.13 (*)    All other components within normal limits  I-STAT CHEM 8, ED - Abnormal; Notable for the following:    BUN 130 (*)    Creatinine, Ser 12.10 (*)    Calcium, Ion 0.88 (*)    Hemoglobin 12.6 (*)    HCT 37.0 (*)    All other components within normal limits  CULTURE, BLOOD (ROUTINE X 2)  CULTURE, BLOOD (ROUTINE X 2)  COMPREHENSIVE METABOLIC PANEL  CORTISOL  TROPONIN I  PROTIME-INR  PROCALCITONIN  APTT   TSH  CBC  MAGNESIUM  PHOSPHORUS  I-STAT CG4 LACTIC ACID, ED  I-STAT CG4 LACTIC ACID, ED  TYPE AND SCREEN    Imaging Review Dg Chest 2 View  04/03/2015  CLINICAL DATA:  Hypotension, hypoxia EXAM: CHEST  2 VIEW COMPARISON:  05/19/2012; 05/18/2012 FINDINGS: Grossly unchanged enlarged cardiac silhouette and mediastinal contours given reduced lung volumes. The pulmonary vasculature is indistinct with cephalization of flow. Interval development of small to moderate-sized bilateral effusions with associated worsening bilateral mid and lower lung heterogeneous opacities. No pneumothorax. No acute osseus abnormalities. IMPRESSION: Findings worrisome for pulmonary edema with small to moderate sized bilateral effusions and associated bibasilar opacities, atelectasis versus infiltrate. Electronically Signed   By:  Sandi Mariscal M.D.   On: 04/03/2015 11:37   I have personally reviewed and evaluated these images and lab results as part of my medical decision-making.   EKG Interpretation   Date/Time:  Friday April 03 2015 10:19:14 EST Ventricular Rate:  93 PR Interval:    QRS Duration: 118 QT Interval:  429 QTC Calculation: 534 R Axis:   111 Text Interpretation:  Atrial fibrillation Incomplete right bundle branch  block Low voltage, extremity leads since last tracing no significant  change Confirmed by Shahed Yeoman  MD, Emmalise Huard ([id number]) on 04/03/2015 10:23:36 AM      MDM   Final diagnoses:  Shock ([place])  ESRD (end stage renal disease) ([place])  Pleural effusion  Acute pulmonary edema (HCC)    The patient is an EKG which shows atrial fibrillation, no signs of hyperkalemia, would consider several etiologies for the patient's hypotension and hypoxia, chest x-ray, EKG ordered. Rule out pneumonia, pleural effusion, fluid overload, hyperkalemia, there is no fever, less likely to be infectious, the patient does not have any symptoms raising the idea that this could be intravascular depletion, I do not see a  history of liver disease in his record but we will check liver function and albumin.  The patient has multiple abnormal lab values including severe elevation in creatinine which is expected given missed dialysis, thankfully potassium is normal and lactic acid is 1.56. His CBC shows a significant leukocytosis of 24,600 with 21.8 neutrophils. The patient has a troponin of 0.13 which I would expect given the patient's underlying renal failure and fluid overloaded state. I personally looked at interpreted and agree with the radiologist interpretation of the x-ray that shows bilateral pulmonary edema with bilateral pleural effusions.  The patient continues to be hypotensive despite fluid resuscitation with 30 mL/kg, I have ordered blood cultures and antibiotics to treat for possible bacteremic source. I discussed his care with the critical care physician will come to see the patient for admission to the intensive care unit. The patient maintains a blood pressure in the 70s at this time. His mentation is normal, he is not in respiratory distress, he is not tachycardic. He was hypothermic, bear hugger was applied.  CRITICAL CARE Performed by: Johnna Acosta Total critical care time: 35 minutes Critical care time was exclusive of separately billable procedures and treating other patients. Critical care was necessary to treat or prevent imminent or life-threatening deterioration. Critical care was time spent personally by me on the following activities: development of treatment plan with patient and/or surrogate as well as nursing, discussions with consultants, evaluation of patient's response to treatment, examination of patient, obtaining history from patient or surrogate, ordering and performing treatments and interventions, ordering and review of laboratory studies, ordering and review of radiographic studies, pulse oximetry and re-evaluation of patient's condition.     Noemi Chapel, MD 04/03/15 [phone]

## 2015-04-03 NOTE — Procedures (Signed)
Central Venous Catheter Insertion Procedure Note Erik Ochoa [id number] [birth year]  Procedure: Insertion of Central Venous Catheter Indications: Drug and/or fluid administration and Frequent blood sampling  Procedure Details Consent: Risks of procedure as well as the alternatives and risks of each were explained to the (patient/caregiver).  Consent for procedure obtained. and Unable to obtain consent because of emergent medical necessity. Time Out: Verified patient identification, verified procedure, site/side was marked, verified correct patient position, special equipment/implants available, medications/allergies/relevent history reviewed, required imaging and test results available.  Performed  Maximum sterile technique was used including antiseptics, cap, gloves, gown, hand hygiene, mask and sheet. Skin prep: Chlorhexidine; local anesthetic administered A antimicrobial bonded/coated triple lumen catheter was placed in the right internal jugular vein using the Seldinger technique.  Evaluation Blood flow good Complications: No apparent complications Patient did tolerate procedure well. Chest X-ray ordered to verify placement.  CXR: pending.  Erik Ochoa 04/03/2015, 4:13 PM

## 2015-04-03 NOTE — Consult Note (Addendum)
[facility] Renal Consultation Note    Indication for Consultation:  Management of ESRD/hemodialysis; anemia, hypertension/volume and secondary hyperparathyroidism  HPI: Erik Ochoa is a 75 y.o. male with ESRD, CHF/pul HTN, chronic ascites requiring periodic paracentesis who was transferred from [facility] this afternoon following an intervention for poorly functioning AVF.  He was hypotensive throughout and afterwards 80 - 0000000 systolic with sats of [id number] .  He originally had this procedure scheduled for Wed but he over slept Wed and Thursday and finally drove himself there today.  The post dialysis kinetics have been ver poor (< 0.5) since 2/14.   He states he didn't know he was sick until he got here today. He has had recent falls and low BP problems post dialysis.  He sleeps poorly.  He has chronic LE edema and has been resistant to lowering his EDW. He denies N, V, fever, chills, diarrhea, cough, SOB. He has a scrape on hs left elbow from a fall.  Past Medical History  Diagnosis Date  . Hypertension   . Renal disorder   . ET (essential thrombocythemia) ([place])   . Hypothyroid   . CHF (congestive heart failure) ([place])   . A-fib ([place])   . Hyperlipidemia   . Pulmonary hypertension ([place])   . Nephrolithiasis    Past Surgical History  Procedure Laterality Date  . Amputation  05/14/2011    Procedure: AMPUTATION DIGIT;  Surgeon: Dennie Bible, MD;  Location: [facility];  Service: Plastics;  Laterality: Right;  right index finger attempted revascularization   Family History  Problem Relation Age of Onset  . Cancer Mother   . Cancer Brother    Social History:  reports that he has never smoked. He has quit using smokeless tobacco. He reports that he does not drink alcohol or use illicit drugs. No Known Allergies Prior to Admission medications   Medication Sig Start Date End Date Taking? Authorizing Provider  amLODipine (NORVASC) 10 MG tablet Take 10 mg by mouth every  evening. In am TTS   Yes Historical Provider, MD  calcium acetate (PHOSLO) 667 MG capsule Take 1,334 mg by mouth every evening.   Yes Historical Provider, MD  Febuxostat 80 MG TABS Take 80 mg by mouth daily.   Yes Historical Provider, MD  GABAPENTIN PO Take 2 capsules by mouth daily. Unknown dose   Yes Historical Provider, MD  levothyroxine (SYNTHROID) 50 MCG tablet Take 50 mcg by mouth daily before breakfast.   Yes Historical Provider, MD  furosemide (LASIX) 80 MG tablet Take 1 tablet (80 mg total) by mouth 2 (two) times daily. Patient not taking: Reported on 04/03/2015 05/20/12   Eugenie Filler, MD  hydrALAZINE (APRESOLINE) 50 MG tablet Take 50 mg by mouth 3 (three) times daily. Reported on 04/03/2015    Historical Provider, MD  metoprolol succinate (TOPROL-XL) 100 MG 24 hr tablet Take 50 mg by mouth 2 (two) times daily. Reported on 04/03/2015    Historical Provider, MD  potassium chloride 20 MEQ TBCR Take 20 mEq by mouth daily. Patient not taking: Reported on 04/03/2015 05/20/12   Eugenie Filler, MD   Current Facility-Administered Medications  Medication Dose Route Frequency Provider Last Rate Last Dose  . 0.9 %  sodium chloride infusion  250 mL Intravenous PRN Erick Colace, NP      . 0.9 %  sodium chloride infusion   Intravenous Continuous Erick Colace, NP      . aspirin chewable tablet 324 mg  324 mg Oral NOW Erick Colace, NP       Or  . aspirin suppository 300 mg  300 mg Rectal NOW Erick Colace, NP      . calcium acetate (PHOSLO) capsule 1,334 mg  1,334 mg Oral Q supper Erick Colace, NP      . heparin injection 5,000 Units  5,000 Units Subcutaneous 3 times per day Erick Colace, NP      . Derrill Memo ON 04/04/2015] levothyroxine (SYNTHROID, LEVOTHROID) tablet 50 mcg  50 mcg Oral QAC breakfast Erick Colace, NP      . norepinephrine (LEVOPHED) 4 mg in dextrose 5 % 250 mL (0.016 mg/mL) infusion  5-50 mcg/min Intravenous Titrated Erick Colace, NP      .  piperacillin-tazobactam (ZOSYN) IVPB 2.25 g  2.25 g Intravenous 3 times per day Chesley Mires, MD      . piperacillin-tazobactam (ZOSYN) IVPB 3.375 g  3.375 g Intravenous Once Noemi Chapel, MD      . sodium chloride 0.9 % bolus 1,000 mL  1,000 mL Intravenous Q1H Erick Colace, NP       Followed by  . sodium chloride 0.9 % bolus 500 mL  500 mL Intravenous Q1H Erick Colace, NP      . vancomycin (VANCOCIN) 1,500 mg in sodium chloride 0.9 % 500 mL IVPB  1,500 mg Intravenous Once Chesley Mires, MD       Current Outpatient Prescriptions  Medication Sig Dispense Refill  . amLODipine (NORVASC) 10 MG tablet Take 10 mg by mouth every evening. In am TTS    . calcium acetate (PHOSLO) 667 MG capsule Take 1,334 mg by mouth every evening.    . Febuxostat 80 MG TABS Take 80 mg by mouth daily.    Marland Kitchen GABAPENTIN PO Take 2 capsules by mouth daily. Unknown dose    . levothyroxine (SYNTHROID) 50 MCG tablet Take 50 mcg by mouth daily before breakfast.    . furosemide (LASIX) 80 MG tablet Take 1 tablet (80 mg total) by mouth 2 (two) times daily. (Patient not taking: Reported on 04/03/2015) 62 tablet 0  . hydrALAZINE (APRESOLINE) 50 MG tablet Take 50 mg by mouth 3 (three) times daily. Reported on 04/03/2015    . metoprolol succinate (TOPROL-XL) 100 MG 24 hr tablet Take 50 mg by mouth 2 (two) times daily. Reported on 04/03/2015    . potassium chloride 20 MEQ TBCR Take 20 mEq by mouth daily. (Patient not taking: Reported on 04/03/2015) 31 tablet 0   Labs: Basic Metabolic Panel:  Recent Labs Lab 04/03/15 1059 04/03/15 1112  NA 141 139  K 5.0 4.7  CL 105 106  CO2 14*  --   GLUCOSE 90 83  BUN 126* 130*  CREATININE 12.98* 12.10*  CALCIUM 7.4*  --    Liver Function Tests:  Recent Labs Lab 04/03/15 1059  AST 17  ALT 13*  ALKPHOS 66  BILITOT 0.6  PROT 5.3*  ALBUMIN 2.7*  CBC:  Recent Labs Lab 04/03/15 1059 04/03/15 1112  WBC 24.6*  --   NEUTROABS 21.8*  --   HGB 11.3* 12.6*  HCT 35.1* 37.0*  MCV  96.2  --   PLT 191  --    Cardiac Enzymes:  Recent Labs Lab 04/03/15 1059  TROPONINI 0.13*   Studies/Results: Dg Chest 2 View  04/03/2015  CLINICAL DATA:  Hypotension, hypoxia EXAM: CHEST  2 VIEW COMPARISON:  05/19/2012; 05/18/2012 FINDINGS: Grossly unchanged enlarged cardiac silhouette and mediastinal  contours given reduced lung volumes. The pulmonary vasculature is indistinct with cephalization of flow. Interval development of small to moderate-sized bilateral effusions with associated worsening bilateral mid and lower lung heterogeneous opacities. No pneumothorax. No acute osseus abnormalities. IMPRESSION: Findings worrisome for pulmonary edema with small to moderate sized bilateral effusions and associated bibasilar opacities, atelectasis versus infiltrate. Electronically Signed   By: Sandi Mariscal M.D.   On: 04/03/2015 11:37    ROS: As per HPI - tends to minimize symptoms  Physical Exam: Filed Vitals:   04/03/15 1445 04/03/15 1500 04/03/15 1515 04/03/15 1530  BP: 66/47 69/48 65/54  67/50  Pulse: 90 79 83 67  Temp:      TempSrc:      Resp: 15 17 14 19   SpO2: 94% 95% 94% 94%     General:  Ill appearing WM under a warming blanket Head: Normocephalic, atraumatic, sclera non-icteric, mucus membranes are moist Neck: Supple. JVD not elevated. Lungs: Dim BS  Breathing is unlabored. Heart: RRR 2/6 murmur Abdomen: Soft, distended + ascites - but only moderate for him. Lower extremities: ++ chronic edema, mild erythema - but this is fairly usual for him Neuro: Alert and oriented X 3. Moves all extremities spontaneously. Psych:  Responds to questions appropriately with a usual affect. Dialysis Access: left lower AVF + bruit  Dialysis Orders:  [facility] 4.15 180 450/800 EDW 76 2 K 2.25 Ca no profile left lower AVF hectorol 1 heparin 8400 venofer 50 2/16 - last hgb 11.5   Assessment/Plan: 1.  Sepsis - WBC 24.6 - etiology unclear - sepsis protocol - CCM to follow; on Vanc and Zosyn 2.   ESRD -  TTS with elevated BUN 126 Cr 12 K 5 CO2 14- due to inadequate HD - due to missed HD and prior to that a poorly functioning AVF.  S/p PTA this am. Plan HD today 3 hours due to high pt load  and tomorrow to get back on schedule and lower volume; needs lower heparin at d/c due to lowered EDW; Acidemia is due to underdialysis and should correct with serial HD as long as AVF functions well. 3. Chronic hypotension/volume  - prone to orthostasis; drives self; last HD 2/21 for 2 hours got to within 2 hr of EDW; CXR pul edema with small to mod pleural effusions; low BP limits UF - do serial HD; BP is lower than usual for him though 80 - 90 on dialysis is expected; on levophed - add midodrine - supposed to be on 20 bid; has been resistant to lowering of EDW in the past; 4.  Anemia  - Hgb 11.3 stable - hold venofer for now. BP meds need to be adjusted prior to d/c  5.  Metabolic bone disease -  Last iPTH 436 - on hectorol 1 - -probably needs increased - no adjustment made after Jan labs.- wait on change for now; phoslo 3 ac tid 6.  Nutrition - poor alb 2.7  7. Chronic ascites - [facility]; LFTs ok 8.  CHF with pulmonary HTN - lower volume as able -  9.  Med review- verify meds in am when released - he should not be on furosemide, amlodipine, KCl or hydralazine; oupt MTP dose needs to be reduced - dialysis meds list and pharm adm list not congruent   Myriam Jacobson, PA-C [place] [phone] 04/03/2015, 3:40 PM   Pt seen, examined and agree w A/P as above. ESRD w chronic liver  dz/ ascites, malnutrtion and pulm HTN/ CHF , chron hypotension presenting with low BP, hypoxemia, marked leukocytosis. Had procedure done on AVF this am.  Temp cath placed, will need CRRT. Orders written.  Looking at BUN/ creat he is not well dialyzed.  Kelly Splinter MD [employer] pager [phone]    cell [phone] 04/03/2015, 4:50 PM

## 2015-04-03 NOTE — ED Notes (Signed)
Dr. Sabra Heck made aware of patients' bp of 77/49, dr Sabra Heck advised patient since pt asymptomtic and not tachycardic, he wants to hold off on fluids at this time.

## 2015-04-03 NOTE — ED Notes (Signed)
MD at bedside. 

## 2015-04-03 NOTE — H&P (Signed)
PULMONARY / CRITICAL CARE MEDICINE   Name: Erik Ochoa MRN: [id number] DOB: 1940/08/05    ADMISSION DATE:  04/03/2015 CONSULTATION DATE:  2/24  REFERRING MD:  Sabra Heck   CHIEF COMPLAINT:  Hypotension   HISTORY OF PRESENT ILLNESS:   75 year old male w/ ESRD. Has been in [place] w/out complaints. Presented to Out-pt vascular center for planned left balloon angioplasty of Left AVF on 2/24. Procedure unremarkable but was hypotensive persistantly after. No HD since 2/21. In ED found to be hypotensive w/ SBP in 70s. Reported he was in usual health. Did feel "lethargic" but he attributed this to poor sleep. On PE has significant LE erythema and warmth to BLEs. Will admit w/ working dx of sepsis (potential sources LE edema vs possible bacterial shed for infected AVF? ).   PAST MEDICAL HISTORY :  He  has a past medical history of Hypertension; Renal disorder; ET (essential thrombocythemia) ([place]); Hypothyroid; CHF (congestive heart failure) ([place]); A-fib ([place]); Hyperlipidemia; Pulmonary hypertension ([place]); and Nephrolithiasis.  PAST SURGICAL HISTORY: He  has past surgical history that includes Amputation (05/14/2011).  No Known Allergies  No current facility-administered medications on file prior to encounter.   Current Outpatient Prescriptions on File Prior to Encounter  Medication Sig  . amLODipine (NORVASC) 10 MG tablet Take 10 mg by mouth every evening. In am TTS  . levothyroxine (SYNTHROID) 50 MCG tablet Take 50 mcg by mouth daily before breakfast.  . furosemide (LASIX) 80 MG tablet Take 1 tablet (80 mg total) by mouth 2 (two) times daily. (Patient not taking: Reported on 04/03/2015)  . hydrALAZINE (APRESOLINE) 50 MG tablet Take 50 mg by mouth 3 (three) times daily. Reported on 04/03/2015  . metoprolol succinate (TOPROL-XL) 100 MG 24 hr tablet Take 50 mg by mouth 2 (two) times daily. Reported on 04/03/2015  . potassium chloride 20 MEQ TBCR Take 20 mEq by mouth daily. (Patient not taking:  Reported on 04/03/2015)    FAMILY HISTORY:  His indicated that his mother is deceased. He indicated that his brother is deceased.   SOCIAL HISTORY: He  reports that he has never smoked. He has quit using smokeless tobacco. He reports that he does not drink alcohol or use illicit drugs.  REVIEW OF SYSTEMS:   Gen: no fever, did feel lethargic this am but attributed it to lack of sleep HENT, pulm, card, abd, endo: negative. GU: anuric. MS: nml. Neuro: baseline neuropathy   SUBJECTIVE:  No distress   VITAL SIGNS: BP 71/53 mmHg  Pulse 83  Temp(Src) 96.3 F (35.7 C) (Rectal)  Resp 15  SpO2 97%  HEMODYNAMICS:    VENTILATOR SETTINGS:    INTAKE / OUTPUT:    PHYSICAL EXAMINATION: General:  Chronically ill appearing white male, resting in bed. No distress.  Neuro:  Awake, oriented. No focal def  HEENT:  Poor dentition. No JVD. MMM  Cardiovascular:  Rrr, II/VI sem  Lungs:  Decreased bses, no accessory muscle use  Abdomen:  Soft not tender + bowel sounds  Musculoskeletal:  Equal st and bulk Skin:  Both LE are warm, slightly swollen and erythremic to level of mid LE   LABS:  BMET  Recent Labs Lab 04/03/15 1059 04/03/15 1112  NA 141 139  K 5.0 4.7  CL 105 106  CO2 14*  --   BUN 126* 130*  CREATININE 12.98* 12.10*  GLUCOSE 90 83    Electrolytes  Recent Labs Lab 04/03/15 1059  CALCIUM 7.4*    CBC  Recent  Labs Lab 04/03/15 1059 04/03/15 1112  WBC 24.6*  --   HGB 11.3* 12.6*  HCT 35.1* 37.0*  PLT 191  --     Coag's No results for input(s): APTT, INR in the last 168 hours.  Sepsis Markers  Recent Labs Lab 04/03/15 1113  LATICACIDVEN 1.56    ABG No results for input(s): PHART, PCO2ART, PO2ART in the last 168 hours.  Liver Enzymes  Recent Labs Lab 04/03/15 1059  AST 17  ALT 13*  ALKPHOS 66  BILITOT 0.6  ALBUMIN 2.7*    Cardiac Enzymes  Recent Labs Lab 04/03/15 1059  TROPONINI 0.13*    Glucose No results for input(s): GLUCAP in  the last 168 hours.  Imaging Dg Chest 2 View  04/03/2015  CLINICAL DATA:  Hypotension, hypoxia EXAM: CHEST  2 VIEW COMPARISON:  05/19/2012; 05/18/2012 FINDINGS: Grossly unchanged enlarged cardiac silhouette and mediastinal contours given reduced lung volumes. The pulmonary vasculature is indistinct with cephalization of flow. Interval development of small to moderate-sized bilateral effusions with associated worsening bilateral mid and lower lung heterogeneous opacities. No pneumothorax. No acute osseus abnormalities. IMPRESSION: Findings worrisome for pulmonary edema with small to moderate sized bilateral effusions and associated bibasilar opacities, atelectasis versus infiltrate. Electronically Signed   By: Sandi Mariscal M.D.   On: 04/03/2015 11:37     STUDIES:     CULTURES: BCX2 2/24>>>  ANTIBIOTICS: vanc 2/24>>> Zosyn 2/24>>>  SIGNIFICANT EVENTS:   LINES/TUBES:   DISCUSSION: 75 year old male w/ ESRD. Takes midodrine for hypotension on dialysis days (BP in 90s). Last HD was Tuesday 2/12. Went to out-pt vascular procedure facility on 2/24 for elective balloon angioplasty of L AVF. Post-procedure developed persistent hypotension w/ room air sats 88% and BP in 70s. PCCM asked to admit.   ASSESSMENT / PLAN:  PULMONARY A: Pleural effusions/pulmonary edema  ->currently on room air. At risk for volume overload P:   Pulse ox O2 as needed  CARDIOVASCULAR A:  Septic shock source unclear   P:  Admit to ICU Complete 43ml/kg Ck cortisol Ck trop I   RENAL A:   ESRD --> last HD Tuesday 123XX123 + AG metabolic acidosis; lactic acid nml. Likely 2/2 renal dz Mild hyperkalemia  P:   Ck abg-->? Acidosis contributing to hypotension  Renal consult. (last HD 2/21), given hypotension may need CRRT if profoundly acidotic OR becomes more hyperkalemic   GASTROINTESTINAL A:   No acute P:   Adv diet as tolerated  HEMATOLOGIC A:   Anemia of chronic disease  P:  Trend cbc  [place]  heparin Transfuse per ICU protocol   INFECTIOUS A:   R/o septic shock: currently potentially sources include LE cellulitis; but also consider transient bacteremia (possibly infected AVF? ) P:   bcx2 sent (see above) Empiric vanc/zosyn  ENDOCRINE A:   Hypothyroidism  P:   Trend cbg  Cont synthroid  NEUROLOGIC A:   H/o peripheral neuropathy  P:   RASS goal: 0 Supportive care Hold sedating meds   FAMILY  - Updates: pending   - Inter-disciplinary family meet or Palliative Care meeting due by: 3/2  Erick Colace ACNP-BC [place] Pager # [phone] OR # [phone] if no answer   04/03/2015, 1:35 PM

## 2015-04-03 NOTE — Progress Notes (Signed)
Pharmacy: Antibiotic Adjustment for CRRT  Change zosyn 3.375g IV q6 Start vancomycin 1g IV q24 - first dose tomorrow at 1700 (received 1500mg  loading dose today at 1657)   Nena Jordan, PharmD, BCPS 04/03/2015, 5:31 PM

## 2015-04-03 NOTE — ED Notes (Signed)
Critical care at bedside  

## 2015-04-03 NOTE — ED Notes (Addendum)
Bed flow contacted regarding an icu bed for patient, per Salvadore Dom, NP's request, Patty in bed control advised they are working on opening up some icu beds but that there are none available at this time.

## 2015-04-03 NOTE — Progress Notes (Signed)
Pharmacy Antibiotic Note Chidiebube Patrizio is a 75 y.o. male admitted on 04/03/2015 with lethargy and LE erythema with concern for sepsis in setting of ESRD. Pharmacy has been consulted for Zosyn and vancomycin dosing.  Plan: 1. Zosyn 2.25 grams IV every 8 hours  2. Vancomycin 1500 mg LD; will schedule further doses based on IHD schedule when known    Temp (24hrs), Avg:97.1 F (36.2 C), Min:96.3 F (35.7 C), Max:97.5 F (36.4 C)   Recent Labs Lab 04/03/15 1059 04/03/15 1112 04/03/15 1113  WBC 24.6*  --   --   CREATININE 12.98* 12.10*  --   LATICACIDVEN  --   --  1.56    CrCl cannot be calculated (Unknown ideal weight.).    No Known Allergies  Antimicrobials this admission: 2/24 Zosyn >>  2/24 Vancomycin >>   Dose adjustments this admission: n/a  Microbiology results: px  Thank you for allowing pharmacy to be a part of this patient's care.  Duayne Cal 04/03/2015 3:25 PM

## 2015-04-04 DIAGNOSIS — L8990 Pressure ulcer of unspecified site, unspecified stage: Secondary | ICD-10-CM | POA: Diagnosis present

## 2015-04-04 LAB — RENAL FUNCTION PANEL
ALBUMIN: 2.6 g/dL — AB (ref 3.5–5.0)
ANION GAP: 17 — AB (ref 5–15)
Albumin: 2.6 g/dL — ABNORMAL LOW (ref 3.5–5.0)
Albumin: 2.6 g/dL — ABNORMAL LOW (ref 3.5–5.0)
Anion gap: 14 (ref 5–15)
Anion gap: 21 — ABNORMAL HIGH (ref 5–15)
BUN: 116 mg/dL — ABNORMAL HIGH (ref 6–20)
BUN: 69 mg/dL — AB (ref 6–20)
BUN: 93 mg/dL — ABNORMAL HIGH (ref 6–20)
CALCIUM: 7.3 mg/dL — AB (ref 8.9–10.3)
CHLORIDE: 103 mmol/L (ref 101–111)
CO2: 16 mmol/L — ABNORMAL LOW (ref 22–32)
CO2: 18 mmol/L — AB (ref 22–32)
CO2: 21 mmol/L — AB (ref 22–32)
CREATININE: 7.35 mg/dL — AB (ref 0.61–1.24)
Calcium: 7 mg/dL — ABNORMAL LOW (ref 8.9–10.3)
Calcium: 7.3 mg/dL — ABNORMAL LOW (ref 8.9–10.3)
Chloride: 106 mmol/L (ref 101–111)
Chloride: 106 mmol/L (ref 101–111)
Creatinine, Ser: 11.52 mg/dL — ABNORMAL HIGH (ref 0.61–1.24)
Creatinine, Ser: 9.05 mg/dL — ABNORMAL HIGH (ref 0.61–1.24)
GFR calc Af Amer: 4 mL/min — ABNORMAL LOW (ref >60)
GFR calc non Af Amer: 4 mL/min — ABNORMAL LOW (ref >60)
GFR calc non Af Amer: 5 mL/min — ABNORMAL LOW (ref >60)
GFR calc non Af Amer: 6 mL/min — ABNORMAL LOW (ref >60)
GFR, EST AFRICAN AMERICAN: 6 mL/min — AB (ref >60)
GFR, EST AFRICAN AMERICAN: 7 mL/min — AB (ref >60)
GLUCOSE: 102 mg/dL — AB (ref 65–99)
Glucose, Bld: 122 mg/dL — ABNORMAL HIGH (ref 65–99)
Glucose, Bld: 130 mg/dL — ABNORMAL HIGH (ref 65–99)
PHOSPHORUS: 8.1 mg/dL — AB (ref 2.5–4.6)
POTASSIUM: 4.9 mmol/L (ref 3.5–5.1)
Phosphorus: 10.8 mg/dL — ABNORMAL HIGH (ref 2.5–4.6)
Phosphorus: 6 mg/dL — ABNORMAL HIGH (ref 2.5–4.6)
Potassium: 4.5 mmol/L (ref 3.5–5.1)
Potassium: 5.3 mmol/L — ABNORMAL HIGH (ref 3.5–5.1)
SODIUM: 141 mmol/L (ref 135–145)
Sodium: 138 mmol/L (ref 135–145)
Sodium: 143 mmol/L (ref 135–145)

## 2015-04-04 LAB — MRSA PCR SCREENING: MRSA BY PCR: NEGATIVE

## 2015-04-04 LAB — CBC
HCT: 39.6 % (ref 39.0–52.0)
HEMATOCRIT: 38.7 % — AB (ref 39.0–52.0)
HEMOGLOBIN: 12.2 g/dL — AB (ref 13.0–17.0)
Hemoglobin: 12.6 g/dL — ABNORMAL LOW (ref 13.0–17.0)
MCH: 30.2 pg (ref 26.0–34.0)
MCH: 31.2 pg (ref 26.0–34.0)
MCHC: 31.5 g/dL (ref 30.0–36.0)
MCHC: 31.8 g/dL (ref 30.0–36.0)
MCV: 95.8 fL (ref 78.0–100.0)
MCV: 98 fL (ref 78.0–100.0)
PLATELETS: 293 10*3/uL (ref 150–400)
Platelets: 360 10*3/uL (ref 150–400)
RBC: 4.04 MIL/uL — AB (ref 4.22–5.81)
RBC: 4.04 MIL/uL — ABNORMAL LOW (ref 4.22–5.81)
RDW: 19 % — ABNORMAL HIGH (ref 11.5–15.5)
RDW: 19.1 % — ABNORMAL HIGH (ref 11.5–15.5)
WBC: 34.4 10*3/uL — ABNORMAL HIGH (ref 4.0–10.5)
WBC: 45.8 10*3/uL — ABNORMAL HIGH (ref 4.0–10.5)

## 2015-04-04 LAB — APTT: aPTT: 39 seconds — ABNORMAL HIGH (ref 24–37)

## 2015-04-04 LAB — MAGNESIUM: MAGNESIUM: 2.1 mg/dL (ref 1.7–2.4)

## 2015-04-04 MED ORDER — MIDODRINE HCL 5 MG PO TABS
20.0000 mg | ORAL_TABLET | Freq: Three times a day (TID) | ORAL | Status: DC
  Administered 2015-04-04 – 2015-04-16 (×35): 20 mg via ORAL
  Filled 2015-04-04 (×33): qty 4

## 2015-04-04 NOTE — H&P (Signed)
PULMONARY / CRITICAL CARE MEDICINE   Name: Erik Ochoa MRN: [id number] DOB: 16-Aug-1940    ADMISSION DATE:  04/03/2015 CONSULTATION DATE:  2/24  REFERRING MD:  Sabra Heck   CHIEF COMPLAINT:  Hypotension   HISTORY OF PRESENT ILLNESS:   75 year old male w/ ESRD. Has been in [place] w/out complaints. Presented to Out-pt vascular center for planned left balloon angioplasty of Left AVF on 2/24. Procedure unremarkable but was hypotensive persistantly after. No HD since 2/21. In ED found to be hypotensive w/ SBP in 70s. Reported he was in usual health. Did feel "lethargic" but he attributed this to poor sleep. On PE has significant LE erythema and warmth to BLEs. Will admit w/ working dx of sepsis (potential sources LE edema vs possible bacterial shed for infected AVF? ).   SUBJECTIVE:  No distress , Alert, talkative Continues on low pressor support with levophed overnight.  Baseline BP's in the low 90's per pt.  Says he gets frequent paracentesis from GI at the [place].  Does not know anything about liver disease, cirrhosis, hepatitis, or any other reason for his ascites.  He says he has some mild pain at his fistula site, otherwise feels better than yesterday.   VITAL SIGNS: BP 88/64 mmHg  Pulse 77  Temp(Src) 93.5 F (34.2 C) (Rectal)  Resp 23  Wt 182 lb 1.6 oz (82.6 kg)  SpO2 98%  HEMODYNAMICS:    VENTILATOR SETTINGS:    INTAKE / OUTPUT: I/O last 3 completed shifts: In: 600.3 [I.V.:500.3; IV Piggyback:100] Out: 331 [Other:331]  PHYSICAL EXAMINATION: General:  Chronically ill appearing white male, resting in bed. No distress.  Neuro:  Awake, oriented. No focal def  HEENT:  Poor dentition. No JVD. MMM  Cardiovascular:  Rrr, II/VI sem  Lungs:  Decreased bses, no accessory muscle use  Abdomen:  Soft not tender + bowel sounds  Musculoskeletal:  Equal st and bulk Skin:  Both LE are warm, improving erythema, no overt evidence of infection there, or over the ulceration on his R  shoulder.   LABS:  BMET  Recent Labs Lab 04/03/15 1615 04/03/15 2345 04/04/15 0527  NA 142 143 141  K 5.2* 5.3* 4.9  CL 108 106 106  CO2 13* 16* 18*  BUN 120* 116* 93*  CREATININE 11.79* 11.52* 9.05*  GLUCOSE 66 130* 122*    Electrolytes  Recent Labs Lab 04/03/15 1615 04/03/15 2345 04/04/15 0527  CALCIUM 6.7* 7.0* 7.3*  MG 2.1  --  2.1  PHOS 10.8* 10.8* 8.1*    CBC  Recent Labs Lab 04/03/15 1615 04/03/15 2344 04/04/15 0527  WBC 21.1* 45.8* 34.4*  HGB 10.3* 12.6* 12.2*  HCT 33.3* 39.6 38.7*  PLT 185 360 293    Coag's  Recent Labs Lab 04/03/15 1615 04/04/15 0527  APTT 32 39*  INR 1.36  --     Sepsis Markers  Recent Labs Lab 04/03/15 1113 04/03/15 1615 04/03/15 1658  LATICACIDVEN 1.56  --  1.57  PROCALCITON  --  0.59  --     ABG No results for input(s): PHART, PCO2ART, PO2ART in the last 168 hours.  Liver Enzymes  Recent Labs Lab 04/03/15 1059 04/03/15 1615 04/03/15 2345 04/04/15 0527  AST 17 15  --   --   ALT 13* 10*  --   --   ALKPHOS 66 60  --   --   BILITOT 0.6 0.7  --   --   ALBUMIN 2.7* 2.4* 2.6* 2.6*    Cardiac  Enzymes  Recent Labs Lab 04/03/15 1059 04/03/15 1615  TROPONINI 0.13* 0.09*    Glucose  Recent Labs Lab 04/03/15 1819  GLUCAP 75    Imaging Dg Chest Port 1 View  04/03/2015  CLINICAL DATA:  Encounter for central line placement. EXAM: PORTABLE CHEST - 1 VIEW COMPARISON:  Two-view chest x-ray from the same day. FINDINGS: The heart is enlarged. Atherosclerotic calcifications are present at the aortic arch. Bilateral pleural effusions are again noted. Any right IJ line is in place. There is no pneumothorax. Lung volumes have slightly improved. IMPRESSION: 1. New right IJ line without radiographic evidence for complication. 2. Slightly improved lung volumes with persistent bilateral pleural effusions and mild diffuse edema. Electronically Signed   By: San Morelle M.D.   On: 04/03/2015 16:40      STUDIES:     CULTURES: BCX2 2/24>>>  ANTIBIOTICS: vanc 2/24>>> Zosyn 2/24>>>  SIGNIFICANT EVENTS: Admitted with presumed septic shock from Cellulitis.   LINES/TUBES: Dialysis Catheter R IJ 2/24 >>  DISCUSSION: 75 year old male w/ ESRD. Takes midodrine for hypotension on dialysis days (BP in 90s). Last HD was Tuesday 2/12. Went to out-pt vascular procedure facility on 2/24 for elective balloon angioplasty of L AVF. Post-procedure developed persistent hypotension w/ room air sats 88% and BP in 70s. PCCM asked to admit. Unclear if his hypotension if baseline, and may also be due to liver disease. Not overly clear that he is infected at this point.   ASSESSMENT / PLAN:  PULMONARY A: Pleural effusions/pulmonary edema  ->currently on room air. At risk for volume overload P:   Pulse ox O2 as needed CRRT with volume removal.   CARDIOVASCULAR A:  Septic shock source unclear  Chronic Afib - rate controlled. Not on anticoagulation per the patient,  P:  Admit to ICU Given Fluids Cortisol slightly low, but weaning off of pressors this am. BP stable.  Trops slightly up but trended down, and pt. ESRD Getting records from [place] regarding afib management.  Continue midodrine.  HTN in the past. Should not be on any antihypertensives.   RENAL A:   ESRD --> last HD Tuesday 123XX123 + AG metabolic acidosis; 2/2 renal dz Mild hyperkalemia  P:   Acidosis improving with CRRT  Renal consult. (last HD 2/21) > appreciate recs.  Fluid balance titrated to blood pressure.  Should not be on KCL at home. Appreciate renal pointing this out.   GASTROINTESTINAL A:   ? Frequent paracentesis at the [place] ? Liver disease > could possibly explain baseline hypotension.  P:   Renal diet Few hallmarks of cirrhosis on lab eval.  Low albumin LFT's normal, and synthetic function preserved.  No thrombocytopenia.  Need records from the [place] to evaluate further. Definite fluid wave to exam.    HEMATOLOGIC A:   Anemia of chronic disease  Leukocytosis - demargination vs sepsis. Coming down.  P:  Trend cbc  [place] heparin Transfuse per ICU protocol   INFECTIOUS A:   R/o septic shock: currently potentially sources include LE cellulitis; but also consider transient bacteremia (possibly infected AVF? ) No clear source.  P:   bcx2 sent (see above) Empiric vanc/zosyn PCT 0.59.  D/c antibiotics as able. Pending read of cultures.  Suspect hypotension and shock due to other medical problems as opposed to sepsis.   ENDOCRINE A:   Hypothyroidism  P:   Trend cbg  Cont synthroid  Cortisol 11.8. Hold off on stress dose steroids for now.   NEUROLOGIC A:  H/o peripheral neuropathy  P:   RASS goal: 0 Supportive care Hold sedating meds   FAMILY  - Updates: pending   - Inter-disciplinary family meet or Palliative Care meeting due by: 3/2  Paula Compton, MD PGY 2  04/04/2015, 12:14 PM

## 2015-04-04 NOTE — Progress Notes (Signed)
[facility] Progress Note   Subjective: not on vent, drowsy, awakens to voice  Filed Vitals:   04/04/15 1115 04/04/15 1130 04/04/15 1145 04/04/15 1200  BP: 91/61 89/61 82/64  88/64  Pulse: 69 57 32 77  Temp:      TempSrc:      Resp: 13 12 20 23   Weight:      SpO2: 99% 99% 97% 98%    Inpatient medications: . antiseptic oral rinse  7 mL Mouth Rinse q12n4p  . calcium acetate  1,334 mg Oral Q supper  . chlorhexidine  15 mL Mouth Rinse BID  . doxercalciferol  1 mcg Intravenous Q T,Th,Sa-HD  . heparin  5,000 Units Subcutaneous 3 times per day  . levothyroxine  50 mcg Oral QAC breakfast  . midodrine  20 mg Oral BID WC  . piperacillin-tazobactam  3.375 g Intravenous 4 times per day  . vancomycin  1,000 mg Intravenous Q24H   . sodium chloride Stopped (04/03/15 2027)  . norepinephrine (LEVOPHED) Adult infusion 7 mcg/min (04/04/15 1100)  . dialysis replacement fluid (prismasate) 400 mL/hr at 04/04/15 1213  . dialysis replacement fluid (prismasate) 200 mL/hr at 04/03/15 2257  . dialysate (PRISMASATE) 1,700 mL/hr at 04/04/15 1125   sodium chloride, sodium chloride, sodium chloride, alteplase, alteplase, heparin, heparin, heparin, lidocaine (PF), lidocaine-prilocaine, pentafluoroprop-tetrafluoroeth, sodium chloride  Exam: General: Ill appearing WM under a warming blanket Neck: Supple. JVD not elevated. Lungs: Dim BS Breathing is unlabored. Heart: RRR 2/6 murmur Abdomen: Soft, 2+ ascites as usual Ext: 1-2+ depend hip/ thigh edema bilat Neuro: Alert and oriented X 3. Moves all ext Dialysis Access: left lower AVF + bruit  Dialysis Orders: [facility] TTS 4.15 180 450/800 EDW 76 2 K 2.25 Ca no profile left lower AVF hectorol 1 heparin 8400 venofer 50 2/16 - last hgb 11.5   Assessment/Plan: 1. Sepsis - WBC 24 > 48 > 34 this am. Cx's pending.  On emp abx. No obvious source. 2. ESRD - TTS with elevated BUN 126 Cr 12 K 5 CO2 14- due to inadequate HD - due to missed HD and  prior to that a poorly functioning AVF. S/p access angioplasty the morning of admission. On CRRT for now 3. Chronic hypotension - supposed to be on 20 tid of midodrine at home 4. Vol excess/ poss pulm edema - up 6kg by wts, CXR bilat effusions and "edema" but not real obvious. On 4L [place].  5. Anemia - Hgb 11.3 stable - hold venofer for now. BP meds need to be adjusted prior to d/c  6. Metabolic bone disease - Last iPTH 436 - on hectorol 1 - -probably needs increased - no adjustment made after Jan labs.- wait on change for now; phoslo 3 ac tid 7. Nutrition - poor alb 2.7  8. Chronic ascites - [facility]; LFTs ok 9. CHF with pulmonary HTN 10. Med review- verify meds in am when released - he should not be on furosemide, amlodipine, KCl or hydralazine; oupt MTP dose needs to be reduced - dialysis meds list and pharm adm list not congruent  Plan - start to try to remove fluid w CRRT.  Lower BP goal for pressor weaning to 80 systolic given chron hypotension.    Kelly Splinter MD [facility] pager [phone]    cell [phone] 04/04/2015, 12:18 PM    Recent Labs Lab 04/03/15 1615 04/03/15 2345 04/04/15 0527  NA 142 143 141  K 5.2* 5.3* 4.9  CL 108 106  106  CO2 13* 16* 18*  GLUCOSE 66 130* 122*  BUN 120* 116* 93*  CREATININE 11.79* 11.52* 9.05*  CALCIUM 6.7* 7.0* 7.3*  PHOS 10.8* 10.8* 8.1*    Recent Labs Lab 04/03/15 1059 04/03/15 1615 04/03/15 2345 04/04/15 0527  AST 17 15  --   --   ALT 13* 10*  --   --   ALKPHOS 66 60  --   --   BILITOT 0.6 0.7  --   --   PROT 5.3* 4.4*  --   --   ALBUMIN 2.7* 2.4* 2.6* 2.6*    Recent Labs Lab 04/03/15 1059  04/03/15 1615 04/03/15 2344 04/04/15 0527  WBC 24.6*  --  21.1* 45.8* 34.4*  NEUTROABS 21.8*  --   --   --   --   HGB 11.3*  < > 10.3* 12.6* 12.2*  HCT 35.1*  < > 33.3* 39.6 38.7*  MCV 96.2  --  96.2 98.0 95.8  PLT 191  --  185 360 293  < > = values in this interval not  displayed.

## 2015-04-05 LAB — CBC
HCT: 36.8 % — ABNORMAL LOW (ref 39.0–52.0)
Hemoglobin: 11.4 g/dL — ABNORMAL LOW (ref 13.0–17.0)
MCH: 29.8 pg (ref 26.0–34.0)
MCHC: 31 g/dL (ref 30.0–36.0)
MCV: 96.3 fL (ref 78.0–100.0)
PLATELETS: 225 10*3/uL (ref 150–400)
RBC: 3.82 MIL/uL — AB (ref 4.22–5.81)
RDW: 19.5 % — ABNORMAL HIGH (ref 11.5–15.5)
WBC: 23.4 10*3/uL — ABNORMAL HIGH (ref 4.0–10.5)

## 2015-04-05 LAB — RENAL FUNCTION PANEL
ALBUMIN: 2.4 g/dL — AB (ref 3.5–5.0)
ALBUMIN: 2.4 g/dL — AB (ref 3.5–5.0)
ANION GAP: 13 (ref 5–15)
Anion gap: 12 (ref 5–15)
BUN: 49 mg/dL — ABNORMAL HIGH (ref 6–20)
BUN: 34 mg/dL — AB (ref 6–20)
CALCIUM: 7.6 mg/dL — AB (ref 8.9–10.3)
CHLORIDE: 106 mmol/L (ref 101–111)
CO2: 22 mmol/L (ref 22–32)
CO2: 22 mmol/L (ref 22–32)
CREATININE: 4.1 mg/dL — AB (ref 0.61–1.24)
Calcium: 7.5 mg/dL — ABNORMAL LOW (ref 8.9–10.3)
Chloride: 106 mmol/L (ref 101–111)
Creatinine, Ser: 5.58 mg/dL — ABNORMAL HIGH (ref 0.61–1.24)
GFR calc non Af Amer: 9 mL/min — ABNORMAL LOW (ref >60)
GFR, EST AFRICAN AMERICAN: 10 mL/min — AB (ref >60)
GFR, EST AFRICAN AMERICAN: 15 mL/min — AB (ref >60)
GFR, EST NON AFRICAN AMERICAN: 13 mL/min — AB (ref >60)
GLUCOSE: 96 mg/dL (ref 65–99)
Glucose, Bld: 100 mg/dL — ABNORMAL HIGH (ref 65–99)
PHOSPHORUS: 4.9 mg/dL — AB (ref 2.5–4.6)
PHOSPHORUS: 3.9 mg/dL (ref 2.5–4.6)
POTASSIUM: 4.3 mmol/L (ref 3.5–5.1)
Potassium: 4.6 mmol/L (ref 3.5–5.1)
SODIUM: 140 mmol/L (ref 135–145)
Sodium: 141 mmol/L (ref 135–145)

## 2015-04-05 LAB — APTT: APTT: 41 s — AB (ref 24–37)

## 2015-04-05 LAB — MAGNESIUM: MAGNESIUM: 2.1 mg/dL (ref 1.7–2.4)

## 2015-04-05 MED ORDER — HYDROCORTISONE NA SUCCINATE PF 100 MG IJ SOLR
50.0000 mg | Freq: Four times a day (QID) | INTRAMUSCULAR | Status: DC
  Administered 2015-04-05 – 2015-04-10 (×21): 50 mg via INTRAVENOUS
  Filled 2015-04-05 (×3): qty 2
  Filled 2015-04-05 (×3): qty 1
  Filled 2015-04-05 (×2): qty 2
  Filled 2015-04-05 (×5): qty 1
  Filled 2015-04-05: qty 2
  Filled 2015-04-05 (×5): qty 1
  Filled 2015-04-05 (×3): qty 2

## 2015-04-05 NOTE — Progress Notes (Signed)
[facility] Progress Note   Subjective: more alert today- worried about his AVF- had UF of 1600/needed to start pressors     Filed Vitals:   04/05/15 0615 04/05/15 0630 04/05/15 0645 04/05/15 0700  BP: 90/56 85/56 78/64  79/60  Pulse: 49 74 47 65  Temp:      TempSrc:      Resp: 13 13 11 12   Height:      Weight:      SpO2: 99% 100% 99% 99%    Inpatient medications: . antiseptic oral rinse  7 mL Mouth Rinse q12n4p  . calcium acetate  1,334 mg Oral Q supper  . chlorhexidine  15 mL Mouth Rinse BID  . doxercalciferol  1 mcg Intravenous Q T,Th,Sa-HD  . heparin  5,000 Units Subcutaneous 3 times per day  . levothyroxine  50 mcg Oral QAC breakfast  . midodrine  20 mg Oral TID WC  . piperacillin-tazobactam  3.375 g Intravenous 4 times per day  . vancomycin  1,000 mg Intravenous Q24H   . sodium chloride Stopped (04/03/15 2027)  . norepinephrine (LEVOPHED) Adult infusion 10 mcg/min (04/05/15 0700)  . dialysis replacement fluid (prismasate) 400 mL/hr at 04/05/15 0150  . dialysis replacement fluid (prismasate) 200 mL/hr at 04/05/15 0155  . dialysate (PRISMASATE) 1,700 mL/hr at 04/05/15 0616   sodium chloride, sodium chloride, sodium chloride, alteplase, alteplase, heparin, heparin, heparin, lidocaine (PF), lidocaine-prilocaine, pentafluoroprop-tetrafluoroeth, sodium chloride  Exam: General:better appearing WM  Neck: Supple. JVD not elevated. Lungs: Dim BS Breathing is unlabored. Heart: RRR 2/6 murmur Abdomen: Soft, 2+ ascites as usual Ext: 1+ depend hip/ thigh edema bilat Neuro: Alert and oriented X 3. Moves all ext Dialysis Access: left lower can palpate thrill distally but not between the stitches- may not be patent    Dialysis Orders: [facility] TTS 4.15 180 450/800 EDW 76 2 K 2.25 Ca no profile left lower AVF hectorol 1 heparin 8400 venofer 50 2/16 - last hgb 11.5   Assessment/Plan: 1. Sepsis - WBC 24 > 48 >  Back down to 24 this AM- this am. Cx's pending.  On  zosyn /vanc. No obvious source. 2. ESRD - TTS with elevated BUN 126- due to missed HD and prior to that a poorly functioning AVF. S/p access angioplasty the morning of admission. On CRRT for now via vascath placed 2/24- numbers much better 3. Chronic hypotension - supposed to be on 20 tid of midodrine at home.  Now on levophed but CVP 15-19 - will decrease UF and follow 4. Vol excess/ poss pulm edema - up 6kg by wts, CXR bilat effusions and "edema" but not real obvious. On  [place].  5. Anemia - Hgb 11.3 stable - hold venofer for now. BP meds need to be adjusted prior to d/c  6. Metabolic bone disease - Last iPTH 436 - on hectorol 1 - -probably needs increased - no adjustment made after Jan labs.- wait on change for now; phoslo 3 ac tid 7. Nutrition - poor alb 2.7  8. Chronic ascites - requires [facility]; LFTs ok 9. CHF with pulmonary HTN 10. Med review- verify meds in am when released - he should not be on furosemide, amlodipine, KCl or hydralazine; oupt MTP dose needs to be reduced - dialysis meds list and pharm adm list not congruent  Plan - decrease fluid removal w CRRT- try to wean pressors.  Lower BP goal for pressor weaning to 80 systolic given chron hypotension. Not sure if access is  patent- to get stitches out (ICU nurse not comfortable- may need to wait for HD staff)    Mishka Stegemann A   04/05/2015, 7:27 AM    Recent Labs Lab 04/04/15 0527 04/04/15 1600 04/05/15 0415  NA 141 138 141  K 4.9 4.5 4.3  CL 106 103 106  CO2 18* 21* 22  GLUCOSE 122* 102* 96  BUN 93* 69* 49*  CREATININE 9.05* 7.35* 5.58*  CALCIUM 7.3* 7.3* 7.5*  PHOS 8.1* 6.0* 4.9*    Recent Labs Lab 04/03/15 1059 04/03/15 1615  04/04/15 0527 04/04/15 1600 04/05/15 0415  AST 17 15  --   --   --   --   ALT 13* 10*  --   --   --   --   ALKPHOS 66 60  --   --   --   --   BILITOT 0.6 0.7  --   --   --   --   PROT 5.3* 4.4*  --   --   --   --   ALBUMIN 2.7* 2.4*  < > 2.6* 2.6*  2.4*  < > = values in this interval not displayed.  Recent Labs Lab 04/03/15 1059  04/03/15 2344 04/04/15 0527 04/05/15 0415  WBC 24.6*  < > 45.8* 34.4* 23.4*  NEUTROABS 21.8*  --   --   --   --   HGB 11.3*  < > 12.6* 12.2* 11.4*  HCT 35.1*  < > 39.6 38.7* 36.8*  MCV 96.2  < > 98.0 95.8 96.3  PLT 191  < > 360 293 225  < > = values in this interval not displayed.

## 2015-04-05 NOTE — H&P (Signed)
PULMONARY / CRITICAL CARE MEDICINE   Name: Erik Ochoa MRN: [id number] DOB: December 12, 1940    ADMISSION DATE:  04/03/2015 CONSULTATION DATE:  2/24  REFERRING MD:  Sabra Heck   CHIEF COMPLAINT:  Hypotension   HISTORY OF PRESENT ILLNESS:   75 year old male w/ ESRD. Has been in [place] w/out complaints. Presented to Out-pt vascular center for planned left balloon angioplasty of Left AVF on 2/24. Procedure unremarkable but was hypotensive persistantly after. No HD since 2/21. In ED found to be hypotensive w/ SBP in 70s. Reported he was in usual health. Did feel "lethargic" but he attributed this to poor sleep. On PE has significant LE erythema and warmth to BLEs. Will admit w/ working dx of sepsis (potential sources LE edema vs possible bacterial shed for infected AVF? ).   SUBJECTIVE:  No distress , Alert, talkative Continues on low pressor support with levophed. Have been unable to wean.  Baseline BP's in the low 90's per pt.  [facility] Has ? ESRD associated ascites Swelling over his fistula site.   VITAL SIGNS: BP 91/55 mmHg  Pulse 70  Temp(Src) 97.2 F (36.2 C) (Oral)  Resp 18  Ht 6\' 4"  (1.93 m)  Wt 179 lb 14.3 oz (81.6 kg)  BMI 21.91 kg/m2  SpO2 100%  HEMODYNAMICS: CVP:  [13 mmHg-19 mmHg] 19 mmHg  VENTILATOR SETTINGS:    INTAKE / OUTPUT: I/O last 3 completed shifts: In: 1505.8 [P.O.:65; I.V.:940.8; IV Piggyback:500] Out: 2809 [Other:2809]  PHYSICAL EXAMINATION: General:  Chronically ill appearing white male, resting in bed. No distress.  Neuro:  Awake, oriented. No focal def  HEENT:  Poor dentition. No JVD. MMM  Cardiovascular:  Rrr, II/VI sem  Lungs:  Decreased bases, no accessory muscle use  Abdomen:  Soft not tender + bowel sounds  Musculoskeletal:  Equal st and bulk, Mild swelling of LUE. No erythema. No hand edema.  Skin:  Both LE are warm, improving erythema, no overt evidence of infection there, or over the ulceration on his R shoulder.    LABS:  BMET  Recent Labs Lab 04/04/15 0527 04/04/15 1600 04/05/15 0415  NA 141 138 141  K 4.9 4.5 4.3  CL 106 103 106  CO2 18* 21* 22  BUN 93* 69* 49*  CREATININE 9.05* 7.35* 5.58*  GLUCOSE 122* 102* 96    Electrolytes  Recent Labs Lab 04/03/15 1615  04/04/15 0527 04/04/15 1600 04/05/15 0415  CALCIUM 6.7*  < > 7.3* 7.3* 7.5*  MG 2.1  --  2.1  --  2.1  PHOS 10.8*  < > 8.1* 6.0* 4.9*  < > = values in this interval not displayed.  CBC  Recent Labs Lab 04/03/15 2344 04/04/15 0527 04/05/15 0415  WBC 45.8* 34.4* 23.4*  HGB 12.6* 12.2* 11.4*  HCT 39.6 38.7* 36.8*  PLT 360 293 225    Coag's  Recent Labs Lab 04/03/15 1615 04/04/15 0527 04/05/15 0415  APTT 32 39* 41*  INR 1.36  --   --     Sepsis Markers  Recent Labs Lab 04/03/15 1113 04/03/15 1615 04/03/15 1658  LATICACIDVEN 1.56  --  1.57  PROCALCITON  --  0.59  --     ABG No results for input(s): PHART, PCO2ART, PO2ART in the last 168 hours.  Liver Enzymes  Recent Labs Lab 04/03/15 1059 04/03/15 1615  04/04/15 0527 04/04/15 1600 04/05/15 0415  AST 17 15  --   --   --   --   ALT  13* 10*  --   --   --   --   ALKPHOS 66 60  --   --   --   --   BILITOT 0.6 0.7  --   --   --   --   ALBUMIN 2.7* 2.4*  < > 2.6* 2.6* 2.4*  < > = values in this interval not displayed.  Cardiac Enzymes  Recent Labs Lab 04/03/15 1059 04/03/15 1615  TROPONINI 0.13* 0.09*    Glucose  Recent Labs Lab 04/03/15 1819  GLUCAP 75    Imaging No results found.   STUDIES:     CULTURES: BCX2 2/24>>>  ANTIBIOTICS: vanc 2/24>>> Zosyn 2/24>>>  SIGNIFICANT EVENTS: Admitted with presumed septic shock from Cellulitis.   LINES/TUBES: Dialysis Catheter R IJ 2/24 >>  DISCUSSION: 75 year old male w/ ESRD. Takes midodrine for hypotension on dialysis days (BP in 90s). Last HD was Tuesday 2/12. Went to out-pt vascular procedure facility on 2/24 for elective balloon angioplasty of L AVF.  Post-procedure developed persistent hypotension w/ room air sats 88% and BP in 70s. PCCM asked to admit. Unclear if his hypotension if baseline, and may also be due to liver disease. Not overly clear that he is infected at this point.   ASSESSMENT / PLAN:  PULMONARY A: Pleural effusions/pulmonary edema  ->currently on room air. At risk for volume overload P:   Pulse ox O2 as needed CRRT with volume removal continues.   CARDIOVASCULAR A:  Septic shock source unclear  Chronic Afib - rate controlled. Not on anticoagulation per the patient,  Hypotensive P:  Cortisol slightly low , holding on stess steroids Attempting wean of pressors. Goal pressures 99991111 systolic.  Low Baseline BP.  Trops slightly up but trended down, and pt. ESRD Records from [place] no anticoagulation on med list. No cardiology notes. Holding for now.  Continue midodrine TID HTN in the past. Should not be on any antihypertensives.   RENAL A:   ESRD --> last HD Tuesday [id number] + AG metabolic acidosis; 2/2 renal dz Mild hyperkalemia  P:   Acidosis improving with CRRT  Renal consult. (last HD 2/21) > appreciate recs.  Fluid balance titrated to blood pressure.  Should not be on KCL at home. Appreciate renal pointing this out.   GASTROINTESTINAL A:   ? Frequent paracentesis at the [place] ? Liver disease > could possibly explain baseline hypotension. No evidence.  P:   Renal diet Few hallmarks of cirrhosis on lab eval.   Low albumin LFT's normal, and synthetic function preserved.  No thrombocytopenia.  Fluid wave to exam. May need therapeutic paracentesis this admission. Would send cytology if done.  [facility] suggest [facility] associated Ascites.   HEMATOLOGIC A:   Anemia of chronic disease  Leukocytosis - demargination vs sepsis. Coming down.  P:  Trend cbc  [place] heparin Transfuse per ICU protocol   INFECTIOUS A:   R/o septic shock: currently potential sources include LE cellulitis; but also consider transient  bacteremia (possibly infected AVF? ) No clear source.  P:   bcx2 sent (see above) Empiric vanc/zosyn still hypotensive and Cx not returned so will continue for now.  PCT 0.59.  D/c antibiotics as able. Pending read of cultures.  Suspect hypotension and shock due to other medical problems as opposed to sepsis.   ENDOCRINE A:   Hypothyroidism  P:   Trend cbg  Cont synthroid  Cortisol 11.8. Stress dose steroids.    NEUROLOGIC A:   H/o peripheral neuropathy  P:   RASS goal: 0 Supportive care Hold sedating meds   FAMILY  - Updates: pending   - Inter-disciplinary family meet or Palliative Care meeting due by: 3/2  Paula Compton, MD PGY 2  04/05/2015, 9:42 AM

## 2015-04-06 ENCOUNTER — Ambulatory Visit (HOSPITAL_COMMUNITY): Payer: Medicare Other

## 2015-04-06 DIAGNOSIS — R188 Other ascites: Secondary | ICD-10-CM

## 2015-04-06 DIAGNOSIS — M7989 Other specified soft tissue disorders: Secondary | ICD-10-CM | POA: Insufficient documentation

## 2015-04-06 DIAGNOSIS — L8990 Pressure ulcer of unspecified site, unspecified stage: Secondary | ICD-10-CM

## 2015-04-06 DIAGNOSIS — J810 Acute pulmonary edema: Secondary | ICD-10-CM | POA: Diagnosis present

## 2015-04-06 DIAGNOSIS — I4891 Unspecified atrial fibrillation: Secondary | ICD-10-CM

## 2015-04-06 LAB — RENAL FUNCTION PANEL
ALBUMIN: 2.6 g/dL — AB (ref 3.5–5.0)
Albumin: 3 g/dL — ABNORMAL LOW (ref 3.5–5.0)
Anion gap: 13 (ref 5–15)
Anion gap: 11 (ref 5–15)
BUN: 25 mg/dL — AB (ref 6–20)
BUN: 20 mg/dL (ref 6–20)
CALCIUM: 8.3 mg/dL — AB (ref 8.9–10.3)
CO2: 23 mmol/L (ref 22–32)
CO2: 25 mmol/L (ref 22–32)
CREATININE: 2.82 mg/dL — AB (ref 0.61–1.24)
Calcium: 7.8 mg/dL — ABNORMAL LOW (ref 8.9–10.3)
Chloride: 102 mmol/L (ref 101–111)
Chloride: 105 mmol/L (ref 101–111)
Creatinine, Ser: 3.37 mg/dL — ABNORMAL HIGH (ref 0.61–1.24)
GFR calc Af Amer: 19 mL/min — ABNORMAL LOW (ref >60)
GFR calc non Af Amer: 21 mL/min — ABNORMAL LOW (ref >60)
GFR, EST AFRICAN AMERICAN: 24 mL/min — AB (ref >60)
GFR, EST NON AFRICAN AMERICAN: 17 mL/min — AB (ref >60)
GLUCOSE: 133 mg/dL — AB (ref 65–99)
Glucose, Bld: 111 mg/dL — ABNORMAL HIGH (ref 65–99)
PHOSPHORUS: 4.3 mg/dL (ref 2.5–4.6)
POTASSIUM: 4.4 mmol/L (ref 3.5–5.1)
Phosphorus: 4.1 mg/dL (ref 2.5–4.6)
Potassium: 4.4 mmol/L (ref 3.5–5.1)
SODIUM: 141 mmol/L (ref 135–145)
Sodium: 138 mmol/L (ref 135–145)

## 2015-04-06 LAB — MAGNESIUM: Magnesium: 2.3 mg/dL (ref 1.7–2.4)

## 2015-04-06 LAB — CBC
HEMATOCRIT: 39.7 % (ref 39.0–52.0)
HEMOGLOBIN: 12 g/dL — AB (ref 13.0–17.0)
MCH: 29.6 pg (ref 26.0–34.0)
MCHC: 30.2 g/dL (ref 30.0–36.0)
MCV: 98 fL (ref 78.0–100.0)
Platelets: 228 10*3/uL (ref 150–400)
RBC: 4.05 MIL/uL — ABNORMAL LOW (ref 4.22–5.81)
RDW: 19.9 % — AB (ref 11.5–15.5)
WBC: 39 10*3/uL — AB (ref 4.0–10.5)

## 2015-04-06 LAB — GLUCOSE, CAPILLARY: Glucose-Capillary: 95 mg/dL (ref 65–99)

## 2015-04-06 LAB — APTT: APTT: 38 s — AB (ref 24–37)

## 2015-04-06 MED ORDER — ATROPINE SULFATE 0.1 MG/ML IJ SOLN
INTRAMUSCULAR | Status: AC
  Filled 2015-04-06: qty 10

## 2015-04-06 MED ORDER — ALBUMIN HUMAN 25 % IV SOLN
50.0000 g | Freq: Once | INTRAVENOUS | Status: AC
  Administered 2015-04-06: 50 g via INTRAVENOUS
  Filled 2015-04-06 (×2): qty 100

## 2015-04-06 MED ORDER — ONDANSETRON HCL 4 MG/2ML IJ SOLN
4.0000 mg | Freq: Three times a day (TID) | INTRAMUSCULAR | Status: DC | PRN
  Administered 2015-04-06 – 2015-04-07 (×2): 4 mg via INTRAVENOUS
  Filled 2015-04-06 (×2): qty 2

## 2015-04-06 MED ORDER — ALBUMIN HUMAN 25 % IV SOLN: 50.0000 g | Freq: Once | INTRAVENOUS | Status: AC

## 2015-04-06 NOTE — Progress Notes (Signed)
VASCULAR LAB PRELIMINARY  PRELIMINARY  PRELIMINARY  PRELIMINARY  Left upper extremity venous duplex has been completed.    Left arm-  No evidence of DVT or superficial thrombosis.   Exam limited due to IV's and bandages at wound in upper arm.  Edema identified below the wound in upper arm   Areli Jowett, RVT, RDMS 04/06/2015, 11:35 AM

## 2015-04-06 NOTE — Procedures (Signed)
Paracentesis Procedure Note  Pre-operative Diagnosis: Ascites  Post-operative Diagnosis: same  Indications: Ascites  Procedure Details  Consent: Informed consent was obtained. Risks of the procedure were discussed including: infection, bleeding, pain, bowel perforation.  Under sterile conditions the patient was positioned. Chlorhexidine solution and sterile drapes were utilized.  1% plain lidocaine was used to anesthetize the skin and subcutaneous tissue. Z track method used to advance needle with angiocath. Angiocath advanced.  Fluid was obtained without any difficulties and minimal blood loss.  A dressing was applied to the wound and wound care instructions were provided.   Findings 4.5 L of clear yellow ascites was obtained. No samples were sent to lab. Complications:  None; patient tolerated the procedure well.          Condition: stable  [name], AGACNP-BC [facility] Pulmonology/Critical Care Pager [phone] or [phone]  04/06/2015 1:40 PM

## 2015-04-06 NOTE — Progress Notes (Signed)
  Echocardiogram 2D Echocardiogram has been performed.  Donata Clay 04/06/2015, 3:34 PM

## 2015-04-06 NOTE — Progress Notes (Signed)
[place] Progress Note Patient Name: Erik Ochoa DOB: July 26, 1940 MRN: [id number]   Date of Service  04/06/2015  HPI/Events of Note  RN called for vomiting. Comfortable right now. 99% on 4L.   eICU Interventions  zofran prn.      Intervention Category Major Interventions: Other:  Rush Landmark 04/06/2015, 3:19 AM

## 2015-04-06 NOTE — Progress Notes (Signed)
[facility] Progress Note   Subjective: no new events, remains on NE; WBC further up this AM   Filed Vitals:   04/06/15 0500 04/06/15 0515 04/06/15 0600 04/06/15 0700  BP: 80/57  84/53 87/58  Pulse: 87 57 104 86  Temp:      TempSrc:      Resp: 19 14 18 18   Height:      Weight:      SpO2: 95% 100% 96% 100%    Inpatient medications: . antiseptic oral rinse  7 mL Mouth Rinse q12n4p  . atropine      . calcium acetate  1,334 mg Oral Q supper  . chlorhexidine  15 mL Mouth Rinse BID  . doxercalciferol  1 mcg Intravenous Q T,Th,Sa-HD  . heparin  5,000 Units Subcutaneous 3 times per day  . hydrocortisone sod succinate (SOLU-CORTEF) inj  50 mg Intravenous Q6H  . levothyroxine  50 mcg Oral QAC breakfast  . midodrine  20 mg Oral TID WC  . piperacillin-tazobactam  3.375 g Intravenous 4 times per day  . vancomycin  1,000 mg Intravenous Q24H   . sodium chloride Stopped (04/03/15 2027)  . norepinephrine (LEVOPHED) Adult infusion 7 mcg/min (04/06/15 0600)  . dialysis replacement fluid (prismasate) 400 mL/hr at 04/06/15 0314  . dialysis replacement fluid (prismasate) 200 mL/hr at 04/06/15 0338  . dialysate (PRISMASATE) 1,700 mL/hr at 04/06/15 [id number]   sodium chloride, sodium chloride, sodium chloride, alteplase, alteplase, heparin, heparin, heparin, lidocaine (PF), lidocaine-prilocaine, ondansetron (ZOFRAN) IV, pentafluoroprop-tetrafluoroeth, sodium chloride  Exam: General:better appearing WM  Neck: Supple. JVD not elevated. Lungs: Dim BS Breathing is unlabored. Heart: RRR 2/6 murmur Abdomen: Soft, 2+ ascites as usual Ext: 1+ depend hip/ thigh edema bilat Neuro: Alert and oriented X 3. Moves all ext Dialysis Access: left lower can palpate thrill distally but not between the stitches- may not be patent    Current CRRT Prescription: Start Date: 2/24 Catheter: R IJ BFR: 250 Pre Blood Pump: 400 4K DFR: 1700 4K Replacement Rate: 200 4K Goal UF: 53mL/hr  neg Anticoagulation: none in circuit Clotting: none   Dialysis Orders: [facility] TTS 4.15 180 450/800 EDW 76 2 K 2.25 Ca no profile left lower AVF hectorol 1 heparin 8400 venofer 50 2/16 - last hgb 11.5   Assessment/Plan: 1. Sepsis with persistent leukocytosis: IV ABX on NE and midodrine 2. ESRD - With a poorly functioning AVF S/p access angioplasty the morning of admission. On CRRT for now via vascath placed 2/24- numbers much better 3. Chronic hypotension - supposed to be on 20 tid of midodrine at home.  On NE now, cont midodrine CVP down to 13 4. Vol excess/ poss pulm edema - up 6kg by wts, on [place], down 2L from admission; cont gentle UF  5. Anemia - Hb stable - hold venofer for now.  6. Metabolic bone disease - Last iPTH 436 - on hectorol 1 - -probably needs increased - no adjustment made after Jan labs.- wait on change for now; phoslo 3 ac tid 7. Nutrition - poor alb 2.7  8. Chronic ascites - [facility]; LFTs ok 9. CHF with pulmonary HTN 10. Med review- verify meds in am when released - he should not be on furosemide, amlodipine, KCl or hydralazine; oupt MTP dose needs to be reduced - dialysis meds list and pharm adm list not congruent  Plan - Cont CRRT, ween pressors as able.  Will need to determine state of AVF    SANFORD,  RYAN B   04/06/2015, 8:04 AM    Recent Labs Lab 04/05/15 0415 04/05/15 1605 04/06/15 0520  NA 141 140 138  K 4.3 4.6 4.4  CL 106 106 102  CO2 22 22 23   GLUCOSE 96 100* 111*  BUN 49* 34* 25*  CREATININE 5.58* 4.10* 3.37*  CALCIUM 7.5* 7.6* 7.8*  PHOS 4.9* 3.9 4.3    Recent Labs Lab 04/03/15 1059 04/03/15 1615  04/05/15 0415 04/05/15 1605 04/06/15 0520  AST 17 15  --   --   --   --   ALT 13* 10*  --   --   --   --   ALKPHOS 66 60  --   --   --   --   BILITOT 0.6 0.7  --   --   --   --   PROT 5.3* 4.4*  --   --   --   --   ALBUMIN 2.7* 2.4*  < > 2.4* 2.4* 2.6*  < > = values in this interval not  displayed.  Recent Labs Lab 04/03/15 1059  04/04/15 0527 04/05/15 0415 04/06/15 0520  WBC 24.6*  < > 34.4* 23.4* 39.0*  NEUTROABS 21.8*  --   --   --   --   HGB 11.3*  < > 12.2* 11.4* 12.0*  HCT 35.1*  < > 38.7* 36.8* 39.7  MCV 96.2  < > 95.8 96.3 98.0  PLT 191  < > 293 225 228  < > = values in this interval not displayed.

## 2015-04-06 NOTE — Progress Notes (Signed)
PULMONARY / CRITICAL CARE MEDICINE   Name: Erik Ochoa MRN: [id number] DOB: 04-23-1940    ADMISSION DATE:  04/03/2015 CONSULTATION DATE:  2/24  REFERRING MD:  Sabra Heck   CHIEF COMPLAINT:  Hypotension   HISTORY OF PRESENT ILLNESS:   75 year old male w/ ESRD. Has been in [place] w/out complaints. Presented to Out-pt vascular center for planned left balloon angioplasty of Left AVF on 2/24. Procedure unremarkable but was hypotensive persistantly after. No HD since 2/21. In ED found to be hypotensive w/ SBP in 70s. Reported he was in usual health. Did feel "lethargic" but he attributed this to poor sleep. On PE has significant LE erythema and warmth to BLEs. Will admit w/ working dx of sepsis (potential sources LE edema vs possible bacterial shed for infected AVF? ).   SUBJECTIVE:  No distress , Alert, talkative Feels better this am, but remains on pressors. Off pressors Systolic BP 77.   VITAL SIGNS: BP 81/58 mmHg  Pulse 66  Temp(Src) 97.3 F (36.3 C) (Oral)  Resp 16  Ht 6\' 4"  (1.93 m)  Wt 178 lb 2.1 oz (80.8 kg)  BMI 21.69 kg/m2  SpO2 100%  HEMODYNAMICS: CVP:  [12 mmHg-21 mmHg] 16 mmHg  VENTILATOR SETTINGS:    INTAKE / OUTPUT: I/O last 3 completed shifts: In: 1289.5 [P.O.:100; I.V.:689.5; IV Piggyback:500] Out: 3826 [Other:3826]  PHYSICAL EXAMINATION: General:  Chronically ill, cachectic  appearing white male, resting in bed. No distress.  Neuro:  Awake, oriented. No focal def  HEENT:  Poor dentition. No JVD. MMM  Cardiovascular:  IR IR , II/VI sem  Lungs:  Decreased bases, no accessory muscle use  Abdomen:  Soft not tender + bowel sounds  Musculoskeletal:  Equal st and bulk, Mild swelling of LUE. No erythema.  Skin:  Both LE are warm, improving erythema, no overt evidence of infection there, or over the ulceration on his R shoulder. Mild abrasion on the left elbow.   LABS:  BMET  Recent Labs Lab 04/05/15 0415 04/05/15 1605 04/06/15 0520  NA 141 140 138   K 4.3 4.6 4.4  CL 106 106 102  CO2 22 22 23   BUN 49* 34* 25*  CREATININE 5.58* 4.10* 3.37*  GLUCOSE 96 100* 111*    Electrolytes  Recent Labs Lab 04/04/15 0527  04/05/15 0415 04/05/15 1605 04/06/15 0520  CALCIUM 7.3*  < > 7.5* 7.6* 7.8*  MG 2.1  --  2.1  --  2.3  PHOS 8.1*  < > 4.9* 3.9 4.3  < > = values in this interval not displayed.  CBC  Recent Labs Lab 04/04/15 0527 04/05/15 0415 04/06/15 0520  WBC 34.4* 23.4* 39.0*  HGB 12.2* 11.4* 12.0*  HCT 38.7* 36.8* 39.7  PLT 293 225 228    Coag's  Recent Labs Lab 04/03/15 1615 04/04/15 0527 04/05/15 0415 04/06/15 0520  APTT 32 39* 41* 38*  INR 1.36  --   --   --     Sepsis Markers  Recent Labs Lab 04/03/15 1113 04/03/15 1615 04/03/15 1658  LATICACIDVEN 1.56  --  1.57  PROCALCITON  --  0.59  --     ABG No results for input(s): PHART, PCO2ART, PO2ART in the last 168 hours.  Liver Enzymes  Recent Labs Lab 04/03/15 1059 04/03/15 1615  04/05/15 0415 04/05/15 1605 04/06/15 0520  AST 17 15  --   --   --   --   ALT 13* 10*  --   --   --   --  ALKPHOS 66 60  --   --   --   --   BILITOT 0.6 0.7  --   --   --   --   ALBUMIN 2.7* 2.4*  < > 2.4* 2.4* 2.6*  < > = values in this interval not displayed.  Cardiac Enzymes  Recent Labs Lab 04/03/15 1059 04/03/15 1615  TROPONINI 0.13* 0.09*    Glucose  Recent Labs Lab 04/03/15 1819 04/03/15 2057  GLUCAP 75 95    Imaging No results found.   STUDIES:     CULTURES: BCX2 2/24>>> NG x 2 days.   ANTIBIOTICS: vanc 2/24>>> 2/27 Zosyn 2/24>>>  SIGNIFICANT EVENTS: Admitted with presumed septic shock from Cellulitis.   LINES/TUBES: Dialysis Catheter R IJ 2/24 >>  DISCUSSION: 75 year old male w/ ESRD. Takes midodrine for hypotension on dialysis days (BP in 90s). Last HD was Tuesday 2/12. Went to out-pt vascular procedure facility on 2/24 for elective balloon angioplasty of L AVF. Post-procedure developed persistent hypotension w/ room  air sats 88% and BP in 70s. PCCM asked to admit. Unclear if his hypotension if baseline, and may also be due to liver disease. Not overly clear that he is infected at this point.   ASSESSMENT / PLAN:  PULMONARY A: Pleural effusions/pulmonary edema  ->currently on room air. At risk for volume overload P:   Pulse ox O2 as needed CRRT with volume removal continues.   CARDIOVASCULAR A:  Septic shock source unclear - resolved Chronic Afib - rate controlled. Not on anticoagulation per the patient,  Hypotensive - likely baseline hypotension.  P:  Cortisol slightly low > started Stress steroids yesterday.  Attempting wean of pressors. Goal pressures 99991111 systolic.  Low Baseline BP.  Trops slightly up but trended down, and pt. ESRD Records from [place] no anticoagulation on med list. No cardiology notes. Holding for now.  Continue midodrine TID HTN in the past. Should not be on any antihypertensives.  Echo today.  [place] of LUE today   RENAL A:   ESRD --> last HD Tuesday [id number] + AG metabolic acidosis; 2/2 renal dz Mild hyperkalemia  P:   Acidosis improving with CRRT  Renal consult. (last HD 2/21) > appreciate recs.  Fluid balance titrated to blood pressure.  Should not be on KCL at home. Appreciate renal pointing this out.    GASTROINTESTINAL A:   ? Frequent paracentesis at the [place] ? Liver disease > could possibly explain baseline hypotension. No evidence.  P:   Renal diet Few hallmarks of cirrhosis on lab eval.   Low albumin LFT's normal, and synthetic function preserved.  No thrombocytopenia.  Fluid wave to exam.   [facility] suggest [facility] associated Ascites.  Paracentesis today > send Fluid cytology.   HEMATOLOGIC A:   Anemia of chronic disease  Leukocytosis - demargination vs sepsis. Also steroids.  P:  Trend cbc  [place] heparin Transfuse per ICU protocol   INFECTIOUS A:   R/o septic shock: currently potential sources include LE cellulitis; but also consider transient  bacteremia (possibly infected AVF? ) No clear source.  Afebrile P:   bcx2 sent (see above) NGTD Empiric vanc/zosyn.  D/C vanc today. Continue Zosyn.  PCT 0.59.  D/c antibiotics as able. Pending read of cultures.  Suspect hypotension and shock due to other medical problems as opposed to sepsis.   ENDOCRINE A:   Hypothyroidism  P:   Trend cbg  Cont synthroid  Cortisol 11.8. Stress dose steroids.    NEUROLOGIC A:  H/o peripheral neuropathy  P:   RASS goal: 0 Supportive care Hold sedating meds   FAMILY  - Updates: pending   - Inter-disciplinary family meet or Palliative Care meeting due by: 3/2  Paula Compton, MD PGY 2  04/06/2015, 12:50 PM

## 2015-04-06 NOTE — Clinical Documentation Improvement (Signed)
Critical Care  Can the diagnosis of CHF be further specified?    Acuity - Acute, Chronic, Acute on Chronic   Type - Systolic, Diastolic, Systolic and Diastolic  Other  Clinically Undetermined   Document any associated diagnoses/conditions   Supporting Information: History of CHF per 02/24 progress notes.   Please exercise your independent, professional judgment when responding. A specific answer is not anticipated or expected.   Thank You,  [place] [phone]

## 2015-04-06 NOTE — Progress Notes (Addendum)
Pharmacy Antibiotic Note Erik Ochoa is a 75 y.o. male admitted on 04/03/2015 with lethargy and LE erythema with concern for sepsis in setting of ESRD. Pharmacy has been consulted for Zosyn and vancomycin dosing. Pt was started on CRRT, so vancomycin and zosyn were adjusted.   Plan: 1. Stop vancomycin today 2. Continue zosyn 3.375 grams IV every 6 hours  3. F/u stopping abx tomorrow   Temp (24hrs), Avg:97.5 F (36.4 C), Min:97.2 F (36.2 C), Max:98 F (36.7 C)   Recent Labs Lab 04/03/15 1113 04/03/15 1615 04/03/15 1658 04/03/15 2344  04/04/15 0527 04/04/15 1600 04/05/15 0415 04/05/15 1605 04/06/15 0520  WBC  --  21.1*  --  45.8*  --  34.4*  --  23.4*  --  39.0*  CREATININE  --  11.79*  --   --   < > 9.05* 7.35* 5.58* 4.10* 3.37*  LATICACIDVEN 1.56  --  1.57  --   --   --   --   --   --   --   < > = values in this interval not displayed.  Estimated Creatinine Clearance: 22 mL/min (by C-G formula based on Cr of 3.37).    No Known Allergies  Antimicrobials this admission: 2/24 Zosyn >>  2/24 Vancomycin >>   Dose adjustments this admission: n/a  Microbiology results: 2/24 BCx: ngtd 2/24 MRSA PCR neg  Thank you for allowing pharmacy to be a part of this patient's care.  Cassie L. Nicole Kindred, PharmD PGY2 Infectious Diseases Pharmacy Resident Pager: [phone] 04/06/2015 3:07 PM

## 2015-04-07 ENCOUNTER — Inpatient Hospital Stay (HOSPITAL_COMMUNITY): Payer: Medicare Other

## 2015-04-07 DIAGNOSIS — I5033 Acute on chronic diastolic (congestive) heart failure: Secondary | ICD-10-CM

## 2015-04-07 DIAGNOSIS — A419 Sepsis, unspecified organism: Secondary | ICD-10-CM

## 2015-04-07 DIAGNOSIS — I272 Other secondary pulmonary hypertension: Secondary | ICD-10-CM

## 2015-04-07 DIAGNOSIS — I509 Heart failure, unspecified: Secondary | ICD-10-CM

## 2015-04-07 LAB — RENAL FUNCTION PANEL
ALBUMIN: 2.8 g/dL — AB (ref 3.5–5.0)
ANION GAP: 11 (ref 5–15)
Albumin: 2.9 g/dL — ABNORMAL LOW (ref 3.5–5.0)
Anion gap: 11 (ref 5–15)
BUN: 16 mg/dL (ref 6–20)
BUN: 18 mg/dL (ref 6–20)
CALCIUM: 8.2 mg/dL — AB (ref 8.9–10.3)
CHLORIDE: 104 mmol/L (ref 101–111)
CO2: 26 mmol/L (ref 22–32)
CO2: 24 mmol/L (ref 22–32)
CREATININE: 2.44 mg/dL — AB (ref 0.61–1.24)
CREATININE: 2.59 mg/dL — AB (ref 0.61–1.24)
Calcium: 8.4 mg/dL — ABNORMAL LOW (ref 8.9–10.3)
Chloride: 102 mmol/L (ref 101–111)
GFR calc non Af Amer: 23 mL/min — ABNORMAL LOW (ref >60)
GFR, EST AFRICAN AMERICAN: 28 mL/min — AB (ref >60)
GFR, EST AFRICAN AMERICAN: 26 mL/min — AB (ref >60)
GFR, EST NON AFRICAN AMERICAN: 25 mL/min — AB (ref >60)
GLUCOSE: 164 mg/dL — AB (ref 65–99)
Glucose, Bld: 125 mg/dL — ABNORMAL HIGH (ref 65–99)
PHOSPHORUS: 3.8 mg/dL (ref 2.5–4.6)
POTASSIUM: 4.7 mmol/L (ref 3.5–5.1)
Phosphorus: 3.8 mg/dL (ref 2.5–4.6)
Potassium: 4.5 mmol/L (ref 3.5–5.1)
SODIUM: 137 mmol/L (ref 135–145)
Sodium: 141 mmol/L (ref 135–145)

## 2015-04-07 LAB — CARBOXYHEMOGLOBIN
Carboxyhemoglobin: 1.5 % (ref 0.5–1.5)
Methemoglobin: 1.2 % (ref 0.0–1.5)
O2 SAT: 56 %
Total hemoglobin: 11.1 g/dL — ABNORMAL LOW (ref 13.5–18.0)

## 2015-04-07 LAB — CBC
HEMATOCRIT: 38.1 % — AB (ref 39.0–52.0)
Hemoglobin: 11.5 g/dL — ABNORMAL LOW (ref 13.0–17.0)
MCH: 29.9 pg (ref 26.0–34.0)
MCHC: 30.2 g/dL (ref 30.0–36.0)
MCV: 99.2 fL (ref 78.0–100.0)
Platelets: 217 10*3/uL (ref 150–400)
RBC: 3.84 MIL/uL — AB (ref 4.22–5.81)
RDW: 20 % — ABNORMAL HIGH (ref 11.5–15.5)
WBC: 49.3 10*3/uL — AB (ref 4.0–10.5)

## 2015-04-07 LAB — C DIFFICILE QUICK SCREEN W PCR REFLEX
C DIFFICILE (CDIFF) INTERP: NEGATIVE
C Diff antigen: NEGATIVE
C Diff toxin: NEGATIVE

## 2015-04-07 LAB — MAGNESIUM: Magnesium: 2.4 mg/dL (ref 1.7–2.4)

## 2015-04-07 LAB — APTT: aPTT: 33 seconds (ref 24–37)

## 2015-04-07 MED ORDER — LIDOCAINE HCL (PF) 1 % IJ SOLN
INTRAMUSCULAR | Status: AC
  Filled 2015-04-07: qty 5

## 2015-04-07 MED ORDER — FENTANYL CITRATE (PF) 100 MCG/2ML IJ SOLN
25.0000 ug | Freq: Once | INTRAMUSCULAR | Status: AC
  Administered 2015-04-07: 25 ug via INTRAVENOUS
  Filled 2015-04-07: qty 2

## 2015-04-07 NOTE — Progress Notes (Signed)
[facility] Progress Note   Subjective: paracentesis yesterday, 4.5L.  WBC up to 48. Weening off NE.  Pulled out temp HD cath some overnight still in SVC.  TTE confirms RV Systolic Failure and overload.    Filed Vitals:   04/07/15 0715 04/07/15 0730 04/07/15 0745 04/07/15 0800  BP: 84/57 71/49 78/55    Pulse: 111 72 71 67  Temp:      TempSrc:      Resp: 10 10 11 11   Height:      Weight:      SpO2: 91% 88% 100% 100%    Inpatient medications: . antiseptic oral rinse  7 mL Mouth Rinse q12n4p  . calcium acetate  1,334 mg Oral Q supper  . chlorhexidine  15 mL Mouth Rinse BID  . doxercalciferol  1 mcg Intravenous Q T,Th,Sa-HD  . heparin  5,000 Units Subcutaneous 3 times per day  . hydrocortisone sod succinate (SOLU-CORTEF) inj  50 mg Intravenous Q6H  . levothyroxine  50 mcg Oral QAC breakfast  . midodrine  20 mg Oral TID WC  . piperacillin-tazobactam  3.375 g Intravenous 4 times per day   . sodium chloride Stopped (04/03/15 2027)  . norepinephrine (LEVOPHED) Adult infusion Stopped (04/07/15 0745)  . dialysis replacement fluid (prismasate) 400 mL/hr at 04/07/15 0513  . dialysis replacement fluid (prismasate) 200 mL/hr at 04/07/15 0540  . dialysate (PRISMASATE) 1,700 mL/hr at 04/07/15 0631   sodium chloride, sodium chloride, sodium chloride, alteplase, alteplase, heparin, heparin, heparin, lidocaine (PF), lidocaine-prilocaine, ondansetron (ZOFRAN) IV, pentafluoroprop-tetrafluoroeth, sodium chloride  Exam: General:better appearing WM  Neck: Supple. JVD not elevated. Lungs: Dim BS Breathing is unlabored. Heart: RRR 2/6 murmur Abdomen: Soft, 2+ ascites as usual Ext: 1+ depend hip/ thigh edema bilat Neuro: Alert and oriented X 3. Moves all ext Dialysis Access: left lower can palpate thrill distally but not between the stitches- may not be patent    Current CRRT Prescription: Start Date: 2/24 Catheter: R IJ BFR: 250 Pre Blood Pump: 400 4K DFR: 1700  4K Replacement Rate: 200 4K Goal UF: 14mL/hr neg Anticoagulation: none in circuit Clotting: none   Dialysis Orders: [facility] TTS 4.15 180 450/800 EDW 76 2 K 2.25 Ca no profile left lower AVF hectorol 1 heparin 8400 venofer 50 2/16 - last hgb 11.5   Assessment/Plan: 1. Sepsis with persistent leukocytosis: IV ABX on weening NE and midodrine 2. ESRD - With a poorly functioning AVF S/p access angioplasty the morning of admission. On CRRT for now via vascath placed 2/24- numbers much better 3. Chronic hypotension - back on 20 tid of midodrine as at home.  On NE now, cont midodrine CVP down to 13 4. Vol excess/ poss pulm edema - at outpt EDW this AM but still R sided overload and s/p 4L paracentesis, cont UF as able and BP permits 5. Anemia - Hb stable - hold venofer for now.  6. Metabolic bone disease - Last iPTH 436 - on hectorol 1 - -probably needs increased - no adjustment made after Jan labs.- wait on change for now; phoslo 3 ac tid 7. Nutrition - poor alb 2.7  8. Chronic ascites - requires periodic paracentesis 9. CHF with pulmonary HTN: recent TTE reviewed  Plan - Will cont CRRT for more UF today.  Labs stable.    Rexene Agent   04/07/2015, 8:02 AM    Recent Labs Lab 04/06/15 0520 04/06/15 1610 04/07/15 0422  NA 138 141 141  K 4.4 4.4 4.7  CL 102  105 104  CO2 23 25 26   GLUCOSE 111* 133* 125*  BUN 25* 20 16  CREATININE 3.37* 2.82* 2.44*  CALCIUM 7.8* 8.3* 8.4*  PHOS 4.3 4.1 3.8    Recent Labs Lab 04/03/15 1059 04/03/15 1615  04/06/15 0520 04/06/15 1610 04/07/15 0422  AST 17 15  --   --   --   --   ALT 13* 10*  --   --   --   --   ALKPHOS 66 60  --   --   --   --   BILITOT 0.6 0.7  --   --   --   --   PROT 5.3* 4.4*  --   --   --   --   ALBUMIN 2.7* 2.4*  < > 2.6* 3.0* 2.9*  < > = values in this interval not displayed.  Recent Labs Lab 04/03/15 1059  04/05/15 0415 04/06/15 0520 04/07/15 0422  WBC 24.6*  < > 23.4* 39.0* 49.3*  NEUTROABS 21.8*  --    --   --   --   HGB 11.3*  < > 11.4* 12.0* 11.5*  HCT 35.1*  < > 36.8* 39.7 38.1*  MCV 96.2  < > 96.3 98.0 99.2  PLT 191  < > 225 228 217  < > = values in this interval not displayed.

## 2015-04-07 NOTE — Progress Notes (Signed)
SLP Cancellation Note  Patient Details Name: Erik Ochoa MRN: [id number] DOB: 11/07/1940   Cancelled treatment:       Reason Eval/Treat Not Completed: Patient at procedure or test/unavailable   Ainsley Sanguinetti, Katherene Ponto 04/07/2015, 1:59 PM

## 2015-04-07 NOTE — Progress Notes (Signed)
Advanced Heart Failure Team Consult Note  Referring Physician: Dr Lake Bells Primary Physician: Primary Cardiologist:  None  Reason for Consultation: RV failure  HPI:    Erik Ochoa is a 75 y.o. male with history of ESRD on HD requiring midodrine for BP support, HTN, Essential thrombocythemia, hypothyrooid, Diastolic CHF, Chronic Afib, HLD, and pulmonary hypertension. He came from [facility] in [place].   Last HD prior to admission 03/22/15. He had angioplasty to his left forearm graft 04/03/15 due to poor flow. This was completed but he became hypotensive and hypoxic afterward, prompting transport to the ER for further evaluation. CCM admitted for shock.  Pertinent labs on admission include K 5.0, creatinine 12.98, WBC 21.1. Troponin 0.09, TSH 8.421. HF team consulted with RV failure on echo, Pulmonary HTN, and persistent Hypotension despite norepi 3 mcg/min and 20 mg midodrine TID.   He states he has not had follow up in with a cardiologist in 2 years. Was told his lung pressures were high at that time and states the only medicine he was started on was metoprolol.  Has Dialysis Tue/Thu/Sat. Only takes midodrine on dialysis day, but takes 20 mg TID. He denies CP or SOB on exertion on flat ground. He does have occasional DOE with stairs or incline. Lives in [place] with a friend.  Mostly independent for ADLs. He gets paracentesis for Ascites every 2 weeks. He denies CP, lightheadedness, or dizziness.  He is unsure if he has ever had a heart catheterization. He denies any MIs or blockages that he knows of. He is prescribed lasix 80 mg BID at home but was not taking. States he makes very little urine independently.  Never Smoker. Former Systems developer of smokeless tobacco. No heavy ETOH use.   He has been on CVVH since admission. Overall out 4.5 L. Weight shows down 15 lbs, though seem inaccurate.   Echo 04/06/15 LVEF [id number], Diastolic flattening of septum, Moderate AS, Mild  MR, Severe LAE, RV severely dilated and reduced, Severe RAE, PA peak pressure 59 mm Hg, moderately increased.   Review of Systems: [y] = yes, [ ]  = no   General: Weight gain [y]; Weight loss [ ] ; Anorexia [ ] ; Fatigue [ ] ; Fever [ ] ; Chills [ ] ; Weakness [ ]   Cardiac: Chest pain/pressure [ ] ; Resting SOB [ ] ; Exertional SOB [ ] ; Orthopnea [ ] ; Pedal Edema [y]; Palpitations [ ] ; Syncope [ ] ; Presyncope [ ] ; Paroxysmal nocturnal dyspnea[ ]   Pulmonary: Cough [ ] ; Wheezing[ ] ; Hemoptysis[ ] ; Sputum [ ] ; Snoring [ ]   GI: Vomiting[ ] ; Dysphagia[ ] ; Melena[ ] ; Hematochezia [ ] ; Heartburn[ ] ; Abdominal pain [ ] ; Constipation [ ] ; Diarrhea [ ] ; BRBPR [ ]   GU: Hematuria[ ] ; Dysuria [ ] ; Nocturia[ ]   Vascular: Pain in legs with walking [ ] ; Pain in feet with lying flat [ ] ; Non-healing sores [ ] ; Stroke [ ] ; TIA [ ] ; Slurred speech [ ] ;  Neuro: Headaches[ ] ; Vertigo[ ] ; Seizures[ ] ; Paresthesias[ ] ;Blurred vision [ ] ; Diplopia [ ] ; Vision changes [ ]   Ortho/Skin: Arthritis [y]; Joint pain [y]; Muscle pain [ ] ; Joint swelling [ ] ; Back Pain [ ] ; Rash [ ]   Psych: Depression[ ] ; Anxiety[ ]   Heme: Bleeding problems [ ] ; Clotting disorders [ ] ; Anemia [ ]   Endocrine: Diabetes [ ] ; Thyroid dysfunction[y]  Home Medications Prior to Admission medications   Medication Sig Start Date End Date Taking? Authorizing Provider  amLODipine (NORVASC) 10 MG tablet Take 10 mg by mouth  every evening. In am TTS   Yes Historical Provider, MD  calcium acetate (PHOSLO) 667 MG capsule Take 1,334 mg by mouth every evening.   Yes Historical Provider, MD  Febuxostat 80 MG TABS Take 80 mg by mouth daily.   Yes Historical Provider, MD  GABAPENTIN PO Take 2 capsules by mouth daily. Unknown dose   Yes Historical Provider, MD  levothyroxine (SYNTHROID) 50 MCG tablet Take 50 mcg by mouth daily before breakfast.   Yes Historical Provider, MD  furosemide (LASIX) 80 MG tablet Take 1 tablet (80 mg total) by mouth 2 (two) times  daily. Patient not taking: Reported on 04/03/2015 05/20/12   Eugenie Filler, MD  hydrALAZINE (APRESOLINE) 50 MG tablet Take 50 mg by mouth 3 (three) times daily. Reported on 04/03/2015    Historical Provider, MD  metoprolol succinate (TOPROL-XL) 100 MG 24 hr tablet Take 50 mg by mouth 2 (two) times daily. Reported on 04/03/2015    Historical Provider, MD  potassium chloride 20 MEQ TBCR Take 20 mEq by mouth daily. Patient not taking: Reported on 04/03/2015 05/20/12   Eugenie Filler, MD    Past Medical History: Past Medical History  Diagnosis Date  . Hypertension   . Renal disorder   . ET (essential thrombocythemia) ([place])   . Hypothyroid   . CHF (congestive heart failure) ([place])   . A-fib ([place])   . Hyperlipidemia   . Pulmonary hypertension ([place])   . Nephrolithiasis     Past Surgical History: Past Surgical History  Procedure Laterality Date  . Amputation  05/14/2011    Procedure: AMPUTATION DIGIT;  Surgeon: Dennie Bible, MD;  Location: [facility] ORS;  Service: Plastics;  Laterality: Right;  right index finger attempted revascularization    Family History: Family History  Problem Relation Age of Onset  . Cancer Mother   . Cancer Brother     Social History: Social History   Social History  . Marital Status: Unknown    Spouse Name: N/A  . Number of Children: N/A  . Years of Education: N/A   Social History Main Topics  . Smoking status: Never Smoker   . Smokeless tobacco: Former Systems developer  . Alcohol Use: No  . Drug Use: No  . Sexual Activity: No   Other Topics Concern  . None   Social History Narrative    Allergies:  No Known Allergies  Objective:    Vital Signs:   Temp:  [97.3 F (36.3 C)-98 F (36.7 C)] 98 F (36.7 C) (02/28 0827) Pulse Rate:  [25-115] 72 (02/28 0915) Resp:  [10-49] 17 (02/28 0915) BP: (58-111)/(46-81) 96/55 mmHg (02/28 0915) SpO2:  [82 %-100 %] 98 % (02/28 0915) Weight:  [167 lb 8.8 oz (76 kg)] 167 lb 8.8 oz (76 kg) (02/28 0600) Last BM Date:  04/06/15  Weight change: Filed Weights   04/05/15 0430 04/06/15 0457 04/07/15 0600  Weight: 179 lb 14.3 oz (81.6 kg) 178 lb 2.1 oz (80.8 kg) 167 lb 8.8 oz (76 kg)    Intake/Output:   Intake/Output Summary (Last 24 hours) at 04/07/15 0919 Last data filed at 04/07/15 0900  Gross per 24 hour  Intake 704.81 ml  Output   2251 ml  Net -1546.19 ml     Physical Exam: On CVVH General:  Chronically ill and elderly appearing HEENT: normal Neck: supple. JVP elevated to jaw . Carotids 2+ bilat; no bruits. No thyromegaly or nodule noted.  Cor: PMI nondisplaced. Irregularly irregular. Lungs: Mild basilar crackles.  Abdomen: soft, NT, ND, no HSM. No bruits or masses. +BS  Extremities: no cyanosis, clubbing, rash, Trace - 1+ peripheral edema. Previous R index finger amputation. Neuro: alert & orientedx3, cranial nerves grossly intact. moves all 4 extremities w/o difficulty. Affect pleasant  Telemetry: Reviewed personally, Afib 70-80s  Labs: Basic Metabolic Panel:  Recent Labs Lab 04/03/15 1615  04/04/15 0527  04/05/15 0415 04/05/15 1605 04/06/15 0520 04/06/15 1610 04/07/15 0422  NA 142  < > 141  < > 141 140 138 141 141  K 5.2*  < > 4.9  < > 4.3 4.6 4.4 4.4 4.7  CL 108  < > 106  < > 106 106 102 105 104  CO2 13*  < > 18*  < > 22 22 23 25 26   GLUCOSE 66  < > 122*  < > 96 100* 111* 133* 125*  BUN 120*  < > 93*  < > 49* 34* 25* 20 16  CREATININE 11.79*  < > 9.05*  < > 5.58* 4.10* 3.37* 2.82* 2.44*  CALCIUM 6.7*  < > 7.3*  < > 7.5* 7.6* 7.8* 8.3* 8.4*  MG 2.1  --  2.1  --  2.1  --  2.3  --  2.4  PHOS 10.8*  < > 8.1*  < > 4.9* 3.9 4.3 4.1 3.8  < > = values in this interval not displayed.  Liver Function Tests:  Recent Labs Lab 04/03/15 1059 04/03/15 1615  04/05/15 0415 04/05/15 1605 04/06/15 0520 04/06/15 1610 04/07/15 0422  AST 17 15  --   --   --   --   --   --   ALT 13* 10*  --   --   --   --   --   --   ALKPHOS 66 60  --   --   --   --   --   --   BILITOT 0.6 0.7  --    --   --   --   --   --   PROT 5.3* 4.4*  --   --   --   --   --   --   ALBUMIN 2.7* 2.4*  < > 2.4* 2.4* 2.6* 3.0* 2.9*  < > = values in this interval not displayed. No results for input(s): LIPASE, AMYLASE in the last 168 hours. No results for input(s): AMMONIA in the last 168 hours.  CBC:  Recent Labs Lab 04/03/15 1059  04/03/15 2344 04/04/15 0527 04/05/15 0415 04/06/15 0520 04/07/15 0422  WBC 24.6*  < > 45.8* 34.4* 23.4* 39.0* 49.3*  NEUTROABS 21.8*  --   --   --   --   --   --   HGB 11.3*  < > 12.6* 12.2* 11.4* 12.0* 11.5*  HCT 35.1*  < > 39.6 38.7* 36.8* 39.7 38.1*  MCV 96.2  < > 98.0 95.8 96.3 98.0 99.2  PLT 191  < > 360 293 225 228 217  < > = values in this interval not displayed.  Cardiac Enzymes:  Recent Labs Lab 04/03/15 1059 04/03/15 1615  TROPONINI 0.13* 0.09*    BNP: BNP (last 3 results) No results for input(s): BNP in the last 8760 hours.  ProBNP (last 3 results) No results for input(s): PROBNP in the last 8760 hours.   CBG:  Recent Labs Lab 04/03/15 1819 04/03/15 2057  GLUCAP 75 95    Coagulation Studies: No results for input(s): LABPROT, INR in the last 72 hours.  Other results: EKG: 04/03/15 Afib 72-108  Imaging: Dg Chest Port 1 View  04/07/2015  CLINICAL DATA:  Assess right central line, after being accidentally tugged. Initial encounter. EXAM: PORTABLE CHEST 1 VIEW COMPARISON:  Chest radiograph performed 04/03/2015 FINDINGS: The patient's right IJ dual-lumen catheter has been retracted, now seen ending overlying the proximal SVC. Small bilateral pleural effusions are noted, larger on the right. Bibasilar airspace opacities may reflect mild interstitial edema or pneumonia. No pneumothorax is seen. The cardiomediastinal silhouette is borderline normal in size. No acute osseous abnormalities are identified. IMPRESSION: 1. Right IJ line has been retracted, now seen ending overlying the proximal SVC. 2. Small bilateral pleural effusions, larger  on the right. Bibasilar airspace opacities may reflect mild interstitial edema or pneumonia. Electronically Signed   By: Garald Balding M.D.   On: 04/07/2015 01:46      Medications:     Current Medications: . antiseptic oral rinse  7 mL Mouth Rinse q12n4p  . calcium acetate  1,334 mg Oral Q supper  . chlorhexidine  15 mL Mouth Rinse BID  . doxercalciferol  1 mcg Intravenous Q T,Th,Sa-HD  . heparin  5,000 Units Subcutaneous 3 times per day  . hydrocortisone sod succinate (SOLU-CORTEF) inj  50 mg Intravenous Q6H  . levothyroxine  50 mcg Oral QAC breakfast  . midodrine  20 mg Oral TID WC  . piperacillin-tazobactam  3.375 g Intravenous 4 times per day     Infusions: . sodium chloride Stopped (04/03/15 2027)  . norepinephrine (LEVOPHED) Adult infusion 3 mcg/min (04/07/15 0900)  . dialysis replacement fluid (prismasate) 400 mL/hr at 04/07/15 0513  . dialysis replacement fluid (prismasate) 200 mL/hr at 04/07/15 0540  . dialysate (PRISMASATE) 1,700 mL/hr at 04/07/15 [id number]      Assessment/Plan  Talyn Duffner Purser is a 76 y.o. male with history of ESRD on HD requiring midodrine for BP support, HTN, Essential thrombocythemia, hypothyrooid, Diastolic CHF, Chronic Afib, HLD, and pulmonary hypertension admitted after developing hypotension and hypoxia after a AVF angiography procedure 04/03/15. HF team consulted with RV failure and pulmonary HTN.   1. Acute on chronic diastolic CHF with prominent RV failure - LVEF 60-65%. CVP 20.  - May benefit from [place]. Will likely need to wait until volume more improved via CRRT. - He is currently on Norepi 3 mcg/min and midodrine 20 mg TID.  2. Pulmonary HTN - Peak PA pressure 59 mm HG by Echo 04/06/15 - Unclear etiology. LVEF normal and no evidence of chronic clots.  - Will order ANA, ANCA, RF, and Anti-Scleroderma Antibodies as part of work up for Idiopathic PAH.  - Will need PFTs as well. Not stable enough currently.  2. Shock - source unclear -  ? due to pulmonary tension, no real source for sepsis.  - WBC 49.3 this am. Thought to be 2/2 stress dose steroids. - Cultures negative. Afebrile. On Zosyn. - He remains hypotensive with SBPs in 70-90s. 3. Chronic Afib - This patients CHA2DS2-VASc Score and unadjusted Ischemic Stroke Rate (% per year) is at least 3.2 % stroke rate/year from a score of 3 - Not on anticoag. Possibly due to ESRD 4. ESRD with HD Tue/Thur/Sat - Currently on CRRT, he remains markedly volume overloaded.  5. Recurrent Ascites - s/p paracentesis 04/06/15 with 4.5 L of clear yellow ascites fluid removed. No cultures with 3 days of ABX and clear fluid  Length of Stay: 4  Shirley Friar PA-C 04/07/2015, 9:19 AM  Advanced Heart Failure Team Pager [phone] (  M-F; 7a - 4p)  Please contact [facility] for night-coverage after hours (4p -7a ) and weekends on [email or web address]  Patient seen with PA, agree with the above note.  1. Atrial fibrillation: Chronic.  Has not been on anticoagulation, per him, because he "bruises too much."  Should probably be back on coumadin eventually.  2. Acute on chronic diastolic CHF with prominent RV failure: Normal LV EF but severely dilated RV with severely decreased RV systolic function. He is markedly volume overloaded, CVP 20.  He is getting CVVH but limited by hypotension.  Suspect there is a component of low output from RV failure in setting of pulmonary hypertension.  - Currently on norepinephrine 3 and home midodrine 20 tid.  Would try to draw off as much fluid as possible via CVVH, would accept SBP in 80s-90s.  He says that for the last 2 months, his SBP has been in the 80s.  - Will plan RHC tomorrow.  If PA pressure high and PCWP not markedly elevated, would consider use of pulmonary vasodilator (sildenafil).  Would like to see some fluid pulled off if possible before RHC.  - Follow CVP, check co-ox now.  3. Pulmonary hypertension: High PA pressure on echo and severe RV systolic  dysfunction.  Possible pulmonary venous hypertension from LV diastolic dysfunction, but he has quite severe RV dysfunction so concern for intrinsic pulmonary vascular disease.   - RHC once he has had more fluid off.  As above, would consider use of Revatio as long as PCWP is not too high. We discussed risks/benefits of the procedure and he agrees to have it done tomorrow potentially.  - Needs eventual V/Q scan and PFTs.  - Rheumatologic serologies.  4. ID: Initial concern for septic shock, but no infectious source found so far.  I wonder if most of the problem is not RV failure with resulting hypotension/volume overload.   Loralie Champagne 04/07/2015 11:39 AM

## 2015-04-07 NOTE — Progress Notes (Signed)
Pt was found attempting to remove R IJ HD cath, pulling at dressing.  Catheter appears to have been pulled out approx 1.5 inches.  At this time, CRRT continues to operate properly, pigtail on HD cath draws back blood.  HD cath was immediately secured with a new dressing and Dr. Minda Ditto was notified.  A STAT portable chest xray has been ordered.  VS stable, pt's hands placed in soft mittens.

## 2015-04-07 NOTE — Procedures (Signed)
Hemodialysis Insertion Procedure Note Erik Ochoa [id number] Jul 25, 1940  Procedure: Insertion of Hemodialysis Catheter Type: 3 port  Indications: Hemodialysis   Procedure Details Consent: Risks of procedure as well as the alternatives and risks of each were explained to the (patient/caregiver).  Consent for procedure obtained. Time Out: Verified patient identification, verified procedure, site/side was marked, verified correct patient position, special equipment/implants available, medications/allergies/relevent history reviewed, required imaging and test results available.  Performed  Maximum sterile technique was used including antiseptics, cap, gloves, gown, hand hygiene, mask and sheet. Skin and existing CVL prep: Chlorhexidine; local anesthetic administered  Inserted guidewire through existing pigtail port on trialysis catheter. Withdrew existing catheter over the guidewire. New trialysis catheter was introduced into the right internal jugular vein over guidewire  Catheter placed to 15 cm. Blood aspirated via all 3 ports and then flushed x 3. Line sutured x 2 and dressing applied.  Evaluation Blood flow good Complications: No apparent complications Patient did tolerate procedure well. Chest X-ray ordered to verify placement.  CXR: pending.  [name], AGACNP-BC [facility] Pulmonology/Critical Care Pager [phone] or [phone]  04/07/2015 2:16 PM

## 2015-04-07 NOTE — Progress Notes (Signed)
UR Completed. Tajae Rybicki, RN, BSN.  [phone] 

## 2015-04-07 NOTE — Progress Notes (Signed)
CXR completed, notified Dr. Minda Ditto.  At this time, MD advised continue with CRRT and Levophed infusion through R IJ HD cath.

## 2015-04-07 NOTE — Progress Notes (Signed)
PULMONARY / CRITICAL CARE MEDICINE   Name: Erik Ochoa MRN: [id number] DOB: [birth year]    ADMISSION DATE:  04/03/2015 CONSULTATION DATE:  2/24  REFERRING MD:  Sabra Heck   CHIEF COMPLAINT:  Hypotension   HISTORY OF PRESENT ILLNESS:   75 year old male w/ ESRD. Has been in [place] w/out complaints. Presented to Out-pt vascular center for planned left balloon angioplasty of Left AVF on 2/24. Procedure unremarkable but was hypotensive persistantly after. No HD since 2/21. In ED found to be hypotensive w/ SBP in 70s. Reported he was in usual health. Did feel "lethargic" but he attributed this to poor sleep. On PE has significant LE erythema and warmth to BLEs. Will admit w/ working dx of sepsis (potential sources LE edema vs possible bacterial shed for infected AVF? ).   SUBJECTIVE:  No distress, Alert and Talkative Pulled his HD catheter out about 1 inch last night.  Remains on Levophed Paracentesis yesterday with some pain at paracentesis site.  Increasing white count this am.   VITAL SIGNS: BP 87/60 mmHg  Pulse 67  Temp(Src) 97.7 F (36.5 C) (Axillary)  Resp 19  Ht 6\' 4"  (1.93 m)  Wt 167 lb 8.8 oz (76 kg)  BMI 20.40 kg/m2  SpO2 99%  HEMODYNAMICS: CVP:  [13 mmHg-16 mmHg] 13 mmHg  VENTILATOR SETTINGS:    INTAKE / OUTPUT: I/O last 3 completed shifts: In: 1263.8 [P.O.:50; I.V.:713.8; IV [id number] Out: 3808 [id number]  PHYSICAL EXAMINATION: General:  Chronically ill, cachectic  appearing white male, resting in bed. No distress.  Neuro:  Awake, oriented. No focal def  HEENT:  Poor dentition. No JVD. MMM  Cardiovascular:  IR IR , II/VI sem  Lungs:  Decreased bases, no accessory muscle use  Abdomen:  Soft not tender + bowel sounds  Musculoskeletal:  Equal st and bulk, Mild swelling of LUE. No erythema.  Skin:  Both LE are warm, improving erythema, no overt evidence of infection there, or over the ulceration on his R shoulder. Mild abrasion on the left elbow.    LABS:  BMET  Recent Labs Lab 04/06/15 0520 04/06/15 1610 04/07/15 0422  NA 138 141 141  K 4.4 4.4 4.7  CL 102 105 104  CO2 23 25 26   BUN 25* 20 16  CREATININE 3.37* 2.82* 2.44*  GLUCOSE 111* 133* 125*    Electrolytes  Recent Labs Lab 04/05/15 0415  04/06/15 0520 04/06/15 1610 04/07/15 0422  CALCIUM 7.5*  < > 7.8* 8.3* 8.4*  MG 2.1  --  2.3  --  2.4  PHOS 4.9*  < > 4.3 4.1 3.8  < > = values in this interval not displayed.  CBC  Recent Labs Lab 04/05/15 0415 04/06/15 0520 04/07/15 0422  WBC 23.4* 39.0* 49.3*  HGB 11.4* 12.0* 11.5*  HCT 36.8* 39.7 38.1*  PLT 225 228 217    Coag's  Recent Labs Lab 04/03/15 1615  04/05/15 0415 04/06/15 0520 04/07/15 0422  APTT 32  < > 41* 38* 33  INR 1.36  --   --   --   --   < > = values in this interval not displayed.  Sepsis Markers  Recent Labs Lab 04/03/15 1113 04/03/15 1615 04/03/15 1658  LATICACIDVEN 1.56  --  1.57  PROCALCITON  --  0.59  --     ABG No results for input(s): PHART, PCO2ART, PO2ART in the last 168 hours.  Liver Enzymes  Recent Labs Lab 04/03/15 1059 04/03/15 1615  04/06/15 0520 04/06/15 1610  04/07/15 0422  AST 17 15  --   --   --   --   ALT 13* 10*  --   --   --   --   ALKPHOS 66 60  --   --   --   --   BILITOT 0.6 0.7  --   --   --   --   ALBUMIN 2.7* 2.4*  < > 2.6* 3.0* 2.9*  < > = values in this interval not displayed.  Cardiac Enzymes  Recent Labs Lab 04/03/15 1059 04/03/15 1615  TROPONINI 0.13* 0.09*    Glucose  Recent Labs Lab 04/03/15 1819 04/03/15 2057  GLUCAP 75 95    Imaging Dg Chest Port 1 View  04/07/2015  CLINICAL DATA:  Assess right central line, after being accidentally tugged. Initial encounter. EXAM: PORTABLE CHEST 1 VIEW COMPARISON:  Chest radiograph performed 04/03/2015 FINDINGS: The patient's right IJ dual-lumen catheter has been retracted, now seen ending overlying the proximal SVC. Small bilateral pleural effusions are noted, larger on  the right. Bibasilar airspace opacities may reflect mild interstitial edema or pneumonia. No pneumothorax is seen. The cardiomediastinal silhouette is borderline normal in size. No acute osseous abnormalities are identified. IMPRESSION: 1. Right IJ line has been retracted, now seen ending overlying the proximal SVC. 2. Small bilateral pleural effusions, larger on the right. Bibasilar airspace opacities may reflect mild interstitial edema or pneumonia. Electronically Signed   By: Garald Balding M.D.   On: 04/07/2015 01:46     STUDIES:     CULTURES: BCX2 2/24>>> NG x 2 days.   ANTIBIOTICS: Vanc 2/24>>> 2/27 Zosyn 2/24>>>  SIGNIFICANT EVENTS: Admitted with presumed septic shock from Cellulitis.   LINES/TUBES: Dialysis Catheter R IJ 2/24 >>  DISCUSSION: 75 year old male w/ ESRD. Takes midodrine for hypotension on dialysis days (BP in 90s). Last HD was Tuesday 2/12. Went to out-pt vascular procedure facility on 2/24 for elective balloon angioplasty of L AVF. Post-procedure developed persistent hypotension w/ room air sats 88% and BP in 70s. PCCM asked to admit. Unclear if his hypotension if baseline, and may also be due to liver disease. Not overly clear that he is infected at this point.   ASSESSMENT / PLAN:  PULMONARY A: Pleural effusions/pulmonary edema  ->currently on room air. At risk for volume overload P:   Pulse ox O2 as needed CRRT with volume removal continues.   CARDIOVASCULAR A:  Septic shock source unclear - resolved Chronic Afib - rate controlled. Not on anticoagulation per the patient,  Hypotensive - likely baseline hypotension.  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  P:  Cortisol slightly low > started Stress steroids Attempting wean of pressors. Goal pressures [id number] systolic.  May need Milrinone. Consider HF consult.  Low Baseline BP.  Records from [place] no anticoagulation on med list. No cardiology notes. Holding for now.  Continue  midodrine TID HTN in the past. Should not be on any antihypertensives.  [place] LUE > No DVT, no evidence of abscess.  Echo >> Preserved LVEF in the setting of Right Heart disease.   RENAL A:   ESRD --> last HD Tuesday [id number] + AG metabolic acidosis; 2/2 renal dz Mild hyperkalemia  P:   Acidosis improving with CRRT  Renal consult. (last HD 2/21) > appreciate recs.  Fluid balance titrated to blood pressure.  Should not be on KCL at home. Appreciate renal pointing this out.   GASTROINTESTINAL A:   ? Frequent paracentesis  at the [place] ? Liver disease > could possibly explain baseline hypotension. No evidence.  Ascites due to RHF P:   Renal diet Few hallmarks of cirrhosis on lab eval.   Low albumin [facility] records suggest ESRD associated Ascites.  Paracentesis yesterday 4.5 L off.  Albumin given.   HEMATOLOGIC A:   Anemia of chronic disease  Leukocytosis - demargination vs sepsis. Also steroids.  P:  Trend cbc   heparin Transfuse per ICU protocol   INFECTIOUS A:   R/o septic shock: currently potential sources include LE cellulitis; but also consider transient bacteremia (possibly infected AVF? ) No clear source.  Afebrile P:   bcx2 sent (see above) NGTD Empiric vanc/zosyn.  D/C vanc today. Continue Zosyn.  Abdominal pain ? SBP. Has been on Zosyn.  PCT 0.59.  D/c antibiotics as able. Pending read of cultures.  Suspect hypotension and shock due to RHF.   ENDOCRINE A:   Hypothyroidism  P:   Trend cbg  Cont synthroid  Cortisol 11.8. Stress dose steroids.    NEUROLOGIC A:   H/o peripheral neuropathy  P:   RASS goal: 0 Supportive care Hold sedating meds   FAMILY  - Updates: pending   - Inter-disciplinary family meet or Palliative Care meeting due by: 3/2  Paula Compton, MD PGY 2  04/07/2015, 6:53 AM

## 2015-04-08 ENCOUNTER — Encounter (HOSPITAL_COMMUNITY)
Admission: EM | Disposition: A | Discharge status: Home Health | Payer: Self-pay | Source: Home / Self Care | Attending: Pulmonary Disease

## 2015-04-08 ENCOUNTER — Encounter (HOSPITAL_COMMUNITY): Payer: Self-pay | Admitting: Cardiology

## 2015-04-08 DIAGNOSIS — I482 Chronic atrial fibrillation: Secondary | ICD-10-CM

## 2015-04-08 DIAGNOSIS — I270 Primary pulmonary hypertension: Secondary | ICD-10-CM

## 2015-04-08 HISTORY — PX: CARDIAC CATHETERIZATION: SHX172

## 2015-04-08 LAB — RENAL FUNCTION PANEL
ALBUMIN: 2.6 g/dL — AB (ref 3.5–5.0)
ANION GAP: 10 (ref 5–15)
Albumin: 2.8 g/dL — ABNORMAL LOW (ref 3.5–5.0)
Anion gap: 11 (ref 5–15)
BUN: 13 mg/dL (ref 6–20)
BUN: 12 mg/dL (ref 6–20)
CO2: 27 mmol/L (ref 22–32)
CO2: 24 mmol/L (ref 22–32)
CREATININE: 2.14 mg/dL — AB (ref 0.61–1.24)
Calcium: 8.4 mg/dL — ABNORMAL LOW (ref 8.9–10.3)
Calcium: 8.3 mg/dL — ABNORMAL LOW (ref 8.9–10.3)
Chloride: 102 mmol/L (ref 101–111)
Chloride: 104 mmol/L (ref 101–111)
Creatinine, Ser: 2.14 mg/dL — ABNORMAL HIGH (ref 0.61–1.24)
GFR calc Af Amer: 33 mL/min — ABNORMAL LOW (ref >60)
GFR calc non Af Amer: 29 mL/min — ABNORMAL LOW (ref >60)
GFR calc non Af Amer: 29 mL/min — ABNORMAL LOW (ref >60)
GFR, EST AFRICAN AMERICAN: 33 mL/min — AB (ref >60)
GLUCOSE: 119 mg/dL — AB (ref 65–99)
Glucose, Bld: 110 mg/dL — ABNORMAL HIGH (ref 65–99)
PHOSPHORUS: 3.2 mg/dL (ref 2.5–4.6)
POTASSIUM: 4.4 mmol/L (ref 3.5–5.1)
POTASSIUM: 4.3 mmol/L (ref 3.5–5.1)
Phosphorus: 3.3 mg/dL (ref 2.5–4.6)
Sodium: 140 mmol/L (ref 135–145)
Sodium: 138 mmol/L (ref 135–145)

## 2015-04-08 LAB — CBC
HCT: 38.7 % — ABNORMAL LOW (ref 39.0–52.0)
Hemoglobin: 11.6 g/dL — ABNORMAL LOW (ref 13.0–17.0)
MCH: 30.1 pg (ref 26.0–34.0)
MCHC: 30 g/dL (ref 30.0–36.0)
MCV: 100.3 fL — ABNORMAL HIGH (ref 78.0–100.0)
PLATELETS: 234 10*3/uL (ref 150–400)
RBC: 3.86 MIL/uL — AB (ref 4.22–5.81)
RDW: 20 % — ABNORMAL HIGH (ref 11.5–15.5)
WBC: 51.2 10*3/uL — AB (ref 4.0–10.5)

## 2015-04-08 LAB — CARBOXYHEMOGLOBIN
CARBOXYHEMOGLOBIN: 1.4 % (ref 0.5–1.5)
METHEMOGLOBIN: 1.3 % (ref 0.0–1.5)
O2 Saturation: 72.3 %
TOTAL HEMOGLOBIN: 11.9 g/dL — AB (ref 13.5–18.0)

## 2015-04-08 LAB — POCT I-STAT 3, VENOUS BLOOD GAS (G3P V)
ACID-BASE EXCESS: 3 mmol/L — AB (ref 0.0–2.0)
BICARBONATE: 27.3 meq/L — AB (ref 20.0–24.0)
BICARBONATE: 29.2 meq/L — AB (ref 20.0–24.0)
O2 SAT: 52 %
O2 Saturation: 53 %
PCO2 VEN: 52.4 mmHg — AB (ref 45.0–50.0)
PCO2 VEN: 52.8 mmHg — AB (ref 45.0–50.0)
PO2 VEN: 30 mmHg (ref 30.0–45.0)
PO2 VEN: 31 mmHg (ref 30.0–45.0)
TCO2: 31 mmol/L (ref 0–100)
TCO2: 29 mmol/L (ref 0–100)
pH, Ven: 7.325 — ABNORMAL HIGH (ref 7.250–7.300)
pH, Ven: 7.351 — ABNORMAL HIGH (ref 7.250–7.300)

## 2015-04-08 LAB — ANCA TITERS
Atypical P-ANCA titer: <1:20 {titer}
C-ANCA: <1:20 {titer}
P-ANCA: <1:20 {titer}

## 2015-04-08 LAB — RHEUMATOID FACTOR: Rhuematoid fact SerPl-aCnc: <10 IU/mL (ref 0.0–13.9)

## 2015-04-08 LAB — MPO/PR-3 (ANCA) ANTIBODIES

## 2015-04-08 LAB — MAGNESIUM: MAGNESIUM: 2.6 mg/dL — AB (ref 1.7–2.4)

## 2015-04-08 LAB — ANTI-SCLERODERMA ANTIBODY

## 2015-04-08 LAB — ANTINUCLEAR ANTIBODIES, IFA: ANA Ab, IFA: NEGATIVE

## 2015-04-08 LAB — APTT: APTT: 37 s (ref 24–37)

## 2015-04-08 SURGERY — RIGHT HEART CATH
Anesthesia: LOCAL

## 2015-04-08 MED ORDER — SODIUM CHLORIDE 0.9 % IV SOLN: 250.0000 mL | INTRAVENOUS | Status: DC | PRN

## 2015-04-08 MED ORDER — SODIUM CHLORIDE 0.9% FLUSH: 3.0000 mL | INTRAVENOUS | Status: DC | PRN

## 2015-04-08 MED ORDER — SODIUM CHLORIDE 0.9 % IV SOLN
250.0000 mL | INTRAVENOUS | Status: DC | PRN
  Administered 2015-04-08: 250 mL via INTRAVENOUS

## 2015-04-08 MED ORDER — FENTANYL CITRATE (PF) 100 MCG/2ML IJ SOLN
INTRAMUSCULAR | Status: DC | PRN
  Administered 2015-04-08: 25 ug via INTRAVENOUS

## 2015-04-08 MED ORDER — MIDAZOLAM HCL 2 MG/2ML IJ SOLN
INTRAMUSCULAR | Status: DC | PRN
  Administered 2015-04-08: 0.5 mg via INTRAVENOUS

## 2015-04-08 MED ORDER — ACETAMINOPHEN 325 MG PO TABS
650.0000 mg | ORAL_TABLET | ORAL | Status: DC | PRN
  Administered 2015-04-11 – 2015-04-16 (×10): 650 mg via ORAL
  Filled 2015-04-08 (×8): qty 2

## 2015-04-08 MED ORDER — SODIUM CHLORIDE 0.9% FLUSH: 3.0000 mL | Freq: Two times a day (BID) | INTRAVENOUS | Status: DC

## 2015-04-08 MED ORDER — HEPARIN (PORCINE) IN NACL 2-0.9 UNIT/ML-% IJ SOLN
INTRAMUSCULAR | Status: DC | PRN
  Administered 2015-04-08: 500 mL

## 2015-04-08 MED ORDER — SODIUM CHLORIDE 0.9 % IV SOLN
250.0000 mL | INTRAVENOUS | Status: DC | PRN
Start: 2015-04-08 — End: 2015-04-08

## 2015-04-08 MED ORDER — ONDANSETRON HCL 4 MG/2ML IJ SOLN: 4.0000 mg | Freq: Four times a day (QID) | INTRAMUSCULAR | Status: DC | PRN

## 2015-04-08 MED ORDER — LIDOCAINE HCL (PF) 1 % IJ SOLN
INTRAMUSCULAR | Status: DC | PRN
  Administered 2015-04-08: 10 mL

## 2015-04-08 MED ORDER — MIDAZOLAM HCL 2 MG/2ML IJ SOLN
INTRAMUSCULAR | Status: AC
  Filled 2015-04-08: qty 2

## 2015-04-08 MED ORDER — HEPARIN (PORCINE) IN NACL 2-0.9 UNIT/ML-% IJ SOLN
INTRAMUSCULAR | Status: AC
  Filled 2015-04-08: qty 500

## 2015-04-08 MED ORDER — FENTANYL CITRATE (PF) 100 MCG/2ML IJ SOLN
INTRAMUSCULAR | Status: AC
  Filled 2015-04-08: qty 2

## 2015-04-08 MED ORDER — SILDENAFIL CITRATE 20 MG PO TABS
20.0000 mg | ORAL_TABLET | Freq: Three times a day (TID) | ORAL | Status: DC
  Administered 2015-04-08 – 2015-04-15 (×22): 20 mg via ORAL
  Filled 2015-04-08 (×27): qty 1

## 2015-04-08 MED ORDER — HEPARIN SODIUM (PORCINE) 5000 UNIT/ML IJ SOLN: 5000.0000 [IU] | Freq: Three times a day (TID) | INTRAMUSCULAR | Status: DC

## 2015-04-08 MED ORDER — LIDOCAINE HCL (PF) 1 % IJ SOLN
INTRAMUSCULAR | Status: AC
  Filled 2015-04-08: qty 30

## 2015-04-08 MED ORDER — SODIUM CHLORIDE 0.9 % IV SOLN: INTRAVENOUS | Status: DC

## 2015-04-08 MED ORDER — SODIUM CHLORIDE 0.9% FLUSH
3.0000 mL | Freq: Two times a day (BID) | INTRAVENOUS | Status: DC
  Administered 2015-04-08: 3 mL via INTRAVENOUS

## 2015-04-08 SURGICAL SUPPLY — 5 items
CATH SWAN GANZ 7F STRAIGHT (CATHETERS) ×2 IMPLANT
KIT HEART RIGHT NAMIC (KITS) ×2 IMPLANT
SHEATH PINNACLE 7F 10CM (SHEATH) ×2 IMPLANT
TRANSDUCER W/STOPCOCK (MISCELLANEOUS) ×2 IMPLANT
WIRE EMERALD 3MM-J .025X260CM (WIRE) ×2 IMPLANT

## 2015-04-08 NOTE — Evaluation (Signed)
Clinical/Bedside Swallow Evaluation Patient Details  Name: Erik Ochoa MRN: [id number] Date of Birth: 26-Nov-1940  Today's Date: 04/08/2015 Time: SLP Start Time (ACUTE ONLY): 0805 SLP Stop Time (ACUTE ONLY): 0835 SLP Time Calculation (min) (ACUTE ONLY): 30 min  Past Medical History:  Past Medical History  Diagnosis Date  . Hypertension   . Renal disorder   . ET (essential thrombocythemia) ([place])   . Hypothyroid   . CHF (congestive heart failure) ([place])   . A-fib ([place])   . Hyperlipidemia   . Pulmonary hypertension ([place])   . Nephrolithiasis    Past Surgical History:  Past Surgical History  Procedure Laterality Date  . Amputation  05/14/2011    Procedure: AMPUTATION DIGIT;  Surgeon: Dennie Bible, MD;  Location: [facility];  Service: Plastics;  Laterality: Right;  right index finger attempted revascularization   HPI:  75 year old male w/ ESRD. Has been in [place] w/out complaints. Presented to Out-pt vascular center for planned left balloon angioplasty of Left AVF on 2/24. Procedure unremarkable but was hypotensive persistently after. Found to have pulmonary HTN. Is on CVVHD. RN reports he coughs during meals.    Assessment / Plan / Recommendation Clinical Impression  Pt demonstrates subtle signs of airway compromise with 3 sips out of 10, otherwise function appears adequate. However, pt reports hard coughing with liquids and occasional regurgitation of liquids which RN confirms. He states this is a new problem. Recommend objective testing when pt is ready to participate (currently on CVVHD, though improving). No diet modifications recommended at this time, pt may continue renal diet, but SLP will f/u tomorrow for readiness for MBS.     Aspiration Risk  Moderate aspiration risk    Diet Recommendation Regular;Thin liquid   Liquid Administration via: Cup;Straw Medication Administration: Whole meds with liquid Supervision: Patient able to self feed Postural Changes: Seated upright  at 90 degrees    Other  Recommendations     Follow up Recommendations  None    Frequency and Duration min 2x/week  1 week       Prognosis        Swallow Study   General HPI: 75 year old male w/ ESRD. Has been in [place] w/out complaints. Presented to Out-pt vascular center for planned left balloon angioplasty of Left AVF on 2/24. Procedure unremarkable but was hypotensive persistently after. Found to have pulmonary HTN. Is on CVVHD. RN reports he coughs during meals.  Type of Study: Bedside Swallow Evaluation Previous Swallow Assessment: none Diet Prior to this Study: Regular;Thin liquids Temperature Spikes Noted: No Respiratory Status: Nasal cannula History of Recent Intubation: No Behavior/Cognition: Alert;Cooperative Oral Cavity Assessment: Within Functional Limits Oral Care Completed by SLP: No Oral Cavity - Dentition: Missing dentition;Poor condition Vision: Functional for self-feeding Self-Feeding Abilities: Able to feed self Patient Positioning: Upright in bed Baseline Vocal Quality: Normal Volitional Cough: Strong Volitional Swallow: Able to elicit    Oral/Motor/Sensory Function Overall Oral Motor/Sensory Function: Within functional limits   Ice Chips Ice chips: Not tested   Thin Liquid Thin Liquid: Impaired Pharyngeal  Phase Impairments: Throat Clearing - Immediate    Nectar Thick Nectar Thick Liquid: Not tested   Honey Thick Honey Thick Liquid: Not tested   Puree Puree: Within functional limits   Solid   GO   Solid: Within functional limits       [name], MA CCC-SLP [id number]  Lynann Beaver 04/08/2015,8:42 AM

## 2015-04-08 NOTE — Care Management Important Message (Signed)
Important Message  Patient Details  Name: Erik Ochoa MRN: [id number] Date of Birth: [birth year]   Medicare Important Message Given:  Yes    Emerlyn Mehlhoff Abena 04/08/2015, 10:25 AM

## 2015-04-08 NOTE — H&P (View-Only) (Signed)
Patient ID: Erik Ochoa, male   DOB: [birth year], 75 y.o.   MRN: [id number]   SUBJECTIVE: Did well yesterday, norepinephrine down to 2.  SBP 90s predominantly.  CVVH ongoing, UF up to 200 cc/hr. CVP 12 today. Co-ox 72%.   Echo: LV EF 60-65%, D-shaped interventricular septum suggestive of RV pressure/volume overload, severely dilated RV with severe RV systolic dysfunction, PASP 59 mmHg. Moderate AS, mild MR.   Scheduled Meds: . antiseptic oral rinse  7 mL Mouth Rinse q12n4p  . calcium acetate  1,334 mg Oral Q supper  . chlorhexidine  15 mL Mouth Rinse BID  . doxercalciferol  1 mcg Intravenous Q T,Th,Sa-HD  . heparin  5,000 Units Subcutaneous 3 times per day  . hydrocortisone sod succinate (SOLU-CORTEF) inj  50 mg Intravenous Q6H  . levothyroxine  50 mcg Oral QAC breakfast  . midodrine  20 mg Oral TID WC  . piperacillin-tazobactam  3.375 g Intravenous 4 times per day  . sodium chloride flush  3 mL Intravenous Q12H   Continuous Infusions: . sodium chloride Stopped (04/03/15 2027)  . [START ON 04/09/2015] sodium chloride    . norepinephrine (LEVOPHED) Adult infusion 2 mcg/min (04/08/15 0200)  . dialysis replacement fluid (prismasate) 400 mL/hr at 04/07/15 2116  . dialysis replacement fluid (prismasate) 200 mL/hr at 04/07/15 0540  . dialysate (PRISMASATE) 1,700 mL/hr at 04/08/15 0606   PRN Meds:.sodium chloride, sodium chloride, sodium chloride, sodium chloride, alteplase, alteplase, heparin, heparin, heparin, lidocaine (PF), lidocaine-prilocaine, ondansetron (ZOFRAN) IV, pentafluoroprop-tetrafluoroeth, sodium chloride, sodium chloride flush    Filed Vitals:   04/08/15 0500 04/08/15 0600 04/08/15 0700 04/08/15 0715  BP: 91/59 91/57 91/56    Pulse: 58 61 70 64  Temp:      TempSrc:      Resp: 12 24 14 17   Height:      Weight: 167 lb 5.3 oz (75.9 kg)     SpO2: 100% 100% 100% 100%    Intake/Output Summary (Last 24 hours) at 04/08/15 0737 Last data filed at 04/08/15 0700  Gross per 24 hour  Intake    806 ml  Output   3910 ml  Net  -3104 ml    LABS: Basic Metabolic Panel:  Recent Labs  04/07/15 0422 04/07/15 1550 04/08/15 0425  NA 141 137 140  K 4.7 4.5 4.4  CL 104 102 102  CO2 26 24 27   GLUCOSE 125* 164* 110*  BUN 16 18 13   CREATININE 2.44* 2.59* 2.14*  CALCIUM 8.4* 8.2* 8.4*  MG 2.4  --  2.6*  PHOS 3.8 3.8 3.3   Liver Function Tests:  Recent Labs  04/07/15 1550 04/08/15 0425  ALBUMIN 2.8* 2.8*   No results for input(s): LIPASE, AMYLASE in the last 72 hours. CBC:  Recent Labs  04/07/15 0422 04/08/15 0425  WBC 49.3* PENDING  HGB 11.5* 11.6*  HCT 38.1* 38.7*  MCV 99.2 100.3*  PLT 217 234   Cardiac Enzymes: No results for input(s): CKTOTAL, CKMB, CKMBINDEX, TROPONINI in the last 72 hours. BNP: Invalid input(s): POCBNP D-Dimer: No results for input(s): DDIMER in the last 72 hours. Hemoglobin A1C: No results for input(s): HGBA1C in the last 72 hours. Fasting Lipid Panel: No results for input(s): CHOL, HDL, LDLCALC, TRIG, CHOLHDL, LDLDIRECT in the last 72 hours. Thyroid Function Tests: No results for input(s): TSH, T4TOTAL, T3FREE, THYROIDAB in the last 72 hours.  Invalid input(s): FREET3 Anemia Panel: No results for input(s): VITAMINB12, FOLATE, FERRITIN, TIBC, IRON, RETICCTPCT in the last 72 hours.  RADIOLOGY: Dg Chest 2 View  04/03/2015  CLINICAL DATA:  Hypotension, hypoxia EXAM: CHEST  2 VIEW COMPARISON:  05/19/2012; 05/18/2012 FINDINGS: Grossly unchanged enlarged cardiac silhouette and mediastinal contours given reduced lung volumes. The pulmonary vasculature is indistinct with cephalization of flow. Interval development of small to moderate-sized bilateral effusions with associated worsening bilateral mid and lower lung heterogeneous opacities. No pneumothorax. No acute osseus abnormalities. IMPRESSION: Findings worrisome for pulmonary edema with small to moderate sized bilateral effusions and associated bibasilar  opacities, atelectasis versus infiltrate. Electronically Signed   By: Sandi Mariscal M.D.   On: 04/03/2015 11:37   Dg Chest Port 1 View  04/07/2015  CLINICAL DATA:  Right central line placement EXAM: PORTABLE CHEST 1 VIEW COMPARISON:  04/07/2015 FINDINGS: Right jugular central venous catheter with the tip projecting over the SVC. Small bilateral pleural effusions. Mild bilateral interstitial thickening. There is no pneumothorax. The heart and mediastinum are stable. The osseous structures are unremarkable. IMPRESSION: 1. Right jugular central venous catheter with the tip projecting over the SVC. 2. Mild CHF. Electronically Signed   By: Kathreen Devoid   On: 04/07/2015 14:54   Dg Chest Port 1 View  04/07/2015  CLINICAL DATA:  Assess right central line, after being accidentally tugged. Initial encounter. EXAM: PORTABLE CHEST 1 VIEW COMPARISON:  Chest radiograph performed 04/03/2015 FINDINGS: The patient's right IJ dual-lumen catheter has been retracted, now seen ending overlying the proximal SVC. Small bilateral pleural effusions are noted, larger on the right. Bibasilar airspace opacities may reflect mild interstitial edema or pneumonia. No pneumothorax is seen. The cardiomediastinal silhouette is borderline normal in size. No acute osseous abnormalities are identified. IMPRESSION: 1. Right IJ line has been retracted, now seen ending overlying the proximal SVC. 2. Small bilateral pleural effusions, larger on the right. Bibasilar airspace opacities may reflect mild interstitial edema or pneumonia. Electronically Signed   By: Garald Balding M.D.   On: 04/07/2015 01:46   Dg Chest Port 1 View  04/03/2015  CLINICAL DATA:  Encounter for central line placement. EXAM: PORTABLE CHEST - 1 VIEW COMPARISON:  Two-view chest x-ray from the same day. FINDINGS: The heart is enlarged. Atherosclerotic calcifications are present at the aortic arch. Bilateral pleural effusions are again noted. Any right IJ line is in place. There is  no pneumothorax. Lung volumes have slightly improved. IMPRESSION: 1. New right IJ line without radiographic evidence for complication. 2. Slightly improved lung volumes with persistent bilateral pleural effusions and mild diffuse edema. Electronically Signed   By: San Morelle M.D.   On: 04/03/2015 16:40    PHYSICAL EXAM General: NAD Neck: JVP 10-12 cm, no thyromegaly or thyroid nodule.  Lungs: Slight decreased breath sounds at bases.  CV: Nondisplaced PMI.  Heart irregular S1/S2, no S3/S4, 2/6 SEM RUSB.  No peripheral edema.   Abdomen: Soft, nontender, no hepatosplenomegaly, mild distention.  Neurologic: Alert and oriented x 3.  Psych: Normal affect. Extremities: No clubbing or cyanosis.   TELEMETRY: Reviewed telemetry pt in atrial fibrillation in 70s  ASSESSMENT AND PLAN: 75 yo with history of chronic atrial fibrillation, ESRD and diastolic CHF/RV failure presented with hypotension and volume overloaded.  1. Atrial fibrillation: Chronic. Has not been on anticoagulation, per him, because he "bruises too much." Should probably be back on coumadin eventually, will need to readdress this with him after RHC.  2. Acute on chronic diastolic CHF with prominent RV failure: Normal LV EF but severely dilated RV with severely decreased RV systolic function. He was admitted with  marked volume overload. He is getting CVVH but this has been limited by hypotension. He is doing better this morning, norepinephrine down to 2 and getting UF at 200 cc/hr.  CVP 12 and co-ox good at 72%.  - Continue UF at 200 cc/hr, would tolerate SBP 80s-90s as suspect this is where he is chronically.  - Can wean norepinephrine as able to keep SBP 80s-90s - Will plan RHC today. If PA pressure high and PCWP not markedly elevated, would consider use of pulmonary vasodilator (sildenafil). Continue to pull fluid off before RHC.  - Follow CVP.  3. Pulmonary hypertension: High PA pressure on echo and severe RV systolic  dysfunction. Possible pulmonary venous hypertension from LV diastolic dysfunction, but he has quite severe RV dysfunction so concern for intrinsic pulmonary vascular disease.  - RHC once he has had more fluid off. As above, would consider use of Revatio as long as PCWP is not too high. We discussed risks/benefits of the procedure and he agrees to have it done.  - Needs eventual V/Q scan and PFTs.  - Rheumatologic serologies.  4. ID: Initial concern for septic shock, but no infectious source found so far. I wonder if most of the problem is not RV failure with resulting hypotension/volume overload.  He continues on Zosyn.  Loralie Champagne 04/08/2015 7:45 AM

## 2015-04-08 NOTE — Progress Notes (Signed)
[facility] Progress Note   Subjective:  AHF saw pt, for RHC today Tolerated more aggressive UF yesterday MvO2 improved this AM NE almost off this AM Pt w/o c/o this AM   Filed Vitals:   04/08/15 0715 04/08/15 0730 04/08/15 0745 04/08/15 0800  BP:  84/56 85/57   Pulse: 64 66 67 66  Temp:      TempSrc:      Resp: 17 20 17 18   Height:      Weight:      SpO2: 100% 100% 100% 100%    Inpatient medications: . antiseptic oral rinse  7 mL Mouth Rinse q12n4p  . calcium acetate  1,334 mg Oral Q supper  . chlorhexidine  15 mL Mouth Rinse BID  . doxercalciferol  1 mcg Intravenous Q T,Th,Sa-HD  . heparin  5,000 Units Subcutaneous 3 times per day  . hydrocortisone sod succinate (SOLU-CORTEF) inj  50 mg Intravenous Q6H  . levothyroxine  50 mcg Oral QAC breakfast  . midodrine  20 mg Oral TID WC  . piperacillin-tazobactam  3.375 g Intravenous 4 times per day  . sodium chloride flush  3 mL Intravenous Q12H   . sodium chloride Stopped (04/03/15 2027)  . [START ON 04/09/2015] sodium chloride    . norepinephrine (LEVOPHED) Adult infusion 2 mcg/min (04/08/15 0200)  . dialysis replacement fluid (prismasate) 400 mL/hr at 04/07/15 2116  . dialysis replacement fluid (prismasate) 200 mL/hr at 04/07/15 0540  . dialysate (PRISMASATE) 1,700 mL/hr at 04/08/15 0606   sodium chloride, sodium chloride, sodium chloride, sodium chloride, alteplase, alteplase, heparin, heparin, heparin, lidocaine (PF), lidocaine-prilocaine, ondansetron (ZOFRAN) IV, pentafluoroprop-tetrafluoroeth, sodium chloride, sodium chloride flush  Exam: General:better appearing WM  Neck: Supple. JVD not elevated. R IJ TDC Lungs: Dim BS Breathing is unlabored. Heart: RRR 2/6 murmur Abdomen: Soft, 2+ ascites as usual Ext: 1+ depend hip/ thigh edema bilat Neuro: Alert and oriented X 3. Moves all ext Dialysis Access: left lower can palpate thrill distally but not between the stitches- may not be patent    Current  CRRT Prescription: Start Date: 2/24 Catheter: R IJ BFR: 250 Pre Blood Pump: 400 4K DFR: 1700 4K Replacement Rate: 200 4K Goal UF: 260mL/hr neg Anticoagulation: none in circuit Clotting: none   Dialysis Orders: [facility] 4.15 180 450/800 EDW 76 2 K 2.25 Ca no profile left lower AVF hectorol 1 heparin 8400 venofer 50 2/16 - last hgb 11.5   Assessment/Plan: 1. Sepsis with persistent leukocytosis: IV ABX on weening NE and midodrine.  All Cx NGTD 2. ESRD - With a poorly functioning AVF S/p access angioplasty the morning of admission. On CRRT for now via vascath placed 2/24- numbers much better 3. Chronic hypotension - back on 20 tid of midodrine as at home.  On a tad of NE now, cont midodrine CVP down to 13 4. Anemia - Hb stable - hold venofer for now.  5. Metabolic bone disease - Last iPTH 436 - on hectorol 1 - -probably needs increased - no adjustment made after Jan labs.- wait on change for now; phoslo 3 ac tid 6. Nutrition - poor alb 2.7  7. Chronic ascites - requires periodic paracentesis 8. CHF with pulmonary HTN: recent TTE reviewed; AHF following for RHC today  Plan - Will cont CRRT for more UF today.  Labs stable.    Pearson Grippe B   04/08/2015, 8:00 AM    Recent Labs Lab 04/07/15 0422 04/07/15 1550 04/08/15 0425  NA 141 137 140  K 4.7 4.5 4.4  CL 104 102 102  CO2 26 24 27   GLUCOSE 125* 164* 110*  BUN 16 18 13   CREATININE 2.44* 2.59* 2.14*  CALCIUM 8.4* 8.2* 8.4*  PHOS 3.8 3.8 3.3    Recent Labs Lab 04/03/15 1059 04/03/15 1615  04/07/15 0422 04/07/15 1550 04/08/15 0425  AST 17 15  --   --   --   --   ALT 13* 10*  --   --   --   --   ALKPHOS 66 60  --   --   --   --   BILITOT 0.6 0.7  --   --   --   --   PROT 5.3* 4.4*  --   --   --   --   ALBUMIN 2.7* 2.4*  < > 2.9* 2.8* 2.8*  < > = values in this interval not displayed.  Recent Labs Lab 04/03/15 1059  04/06/15 0520 04/07/15 0422 04/08/15 0425  WBC 24.6*  < > 39.0* 49.3* 51.2*  NEUTROABS  21.8*  --   --   --   --   HGB 11.3*  < > 12.0* 11.5* 11.6*  HCT 35.1*  < > 39.7 38.1* 38.7*  MCV 96.2  < > 98.0 99.2 100.3*  PLT 191  < > 228 217 234  < > = values in this interval not displayed.

## 2015-04-08 NOTE — Interval H&P Note (Signed)
History and Physical Interval Note:  04/08/2015 12:59 PM  Erik Ochoa  has presented today for surgery, with the diagnosis of Pulmonary HTN  The various methods of treatment have been discussed with the patient and family. After consideration of risks, benefits and other options for treatment, the patient has consented to  Procedure(s): Right Heart Cath (N/A) as a surgical intervention .  The patient's history has been reviewed, patient examined, no change in status, stable for surgery.  I have reviewed the patient's chart and labs.  Questions were answered to the patient's satisfaction.     Angy Swearengin [employer]

## 2015-04-08 NOTE — Care Management Note (Signed)
Case Management Note  Patient Details  Name: Erik Ochoa MRN: [id number] Date of Birth: Feb 07, 1941  Subjective/Objective:       Pt admitted with sepsis and hypotention             Action/Plan:  Pt is alert and oriented  - independent PTA .  CM will continue to monitor   Expected Discharge Date:                  Expected Discharge Plan:  Home/Self Care  In-House Referral:     Discharge planning Services  CM Consult  Post Acute Care Choice:    Choice offered to:     DME Arranged:    DME Agency:     HH Arranged:    HH Agency:     Status of Service:  In process, will continue to follow  Medicare Important Message Given:  Yes Date Medicare IM Given:    Medicare IM give by:    Date Additional Medicare IM Given:    Additional Medicare Important Message give by:     If discussed at [place] of Stay Meetings, dates discussed:    Additional Comments:  Maryclare Labrador, RN 04/08/2015, 11:09 AM

## 2015-04-08 NOTE — Progress Notes (Signed)
Patient ID: Erik Ochoa, male   DOB: August 01, 1940, 75 y.o.   MRN: [id number]   SUBJECTIVE: Did well yesterday, norepinephrine down to 2.  SBP 90s predominantly.  CVVH ongoing, UF up to 200 cc/hr. CVP 12 today. Co-ox 72%.   Echo: LV EF 60-65%, D-shaped interventricular septum suggestive of RV pressure/volume overload, severely dilated RV with severe RV systolic dysfunction, PASP 59 mmHg. Moderate AS, mild MR.   Scheduled Meds: . antiseptic oral rinse  7 mL Mouth Rinse q12n4p  . calcium acetate  1,334 mg Oral Q supper  . chlorhexidine  15 mL Mouth Rinse BID  . doxercalciferol  1 mcg Intravenous Q T,Th,Sa-HD  . heparin  5,000 Units Subcutaneous 3 times per day  . hydrocortisone sod succinate (SOLU-CORTEF) inj  50 mg Intravenous Q6H  . levothyroxine  50 mcg Oral QAC breakfast  . midodrine  20 mg Oral TID WC  . piperacillin-tazobactam  3.375 g Intravenous 4 times per day  . sodium chloride flush  3 mL Intravenous Q12H   Continuous Infusions: . sodium chloride Stopped (04/03/15 2027)  . [START ON 04/09/2015] sodium chloride    . norepinephrine (LEVOPHED) Adult infusion 2 mcg/min (04/08/15 0200)  . dialysis replacement fluid (prismasate) 400 mL/hr at 04/07/15 2116  . dialysis replacement fluid (prismasate) 200 mL/hr at 04/07/15 0540  . dialysate (PRISMASATE) 1,700 mL/hr at 04/08/15 0606   PRN Meds:.sodium chloride, sodium chloride, sodium chloride, sodium chloride, alteplase, alteplase, heparin, heparin, heparin, lidocaine (PF), lidocaine-prilocaine, ondansetron (ZOFRAN) IV, pentafluoroprop-tetrafluoroeth, sodium chloride, sodium chloride flush    Filed Vitals:   04/08/15 0500 04/08/15 0600 04/08/15 0700 04/08/15 0715  BP: 91/59 91/57 91/56    Pulse: 58 61 70 64  Temp:      TempSrc:      Resp: 12 24 14 17   Height:      Weight: 167 lb 5.3 oz (75.9 kg)     SpO2: 100% 100% 100% 100%    Intake/Output Summary (Last 24 hours) at 04/08/15 0737 Last data filed at 04/08/15 0700  Gross per 24 hour  Intake    806 ml  Output   3910 ml  Net  -3104 ml    LABS: Basic Metabolic Panel:  Recent Labs  04/07/15 0422 04/07/15 1550 04/08/15 0425  NA 141 137 140  K 4.7 4.5 4.4  CL 104 102 102  CO2 26 24 27   GLUCOSE 125* 164* 110*  BUN 16 18 13   CREATININE 2.44* 2.59* 2.14*  CALCIUM 8.4* 8.2* 8.4*  MG 2.4  --  2.6*  PHOS 3.8 3.8 3.3   Liver Function Tests:  Recent Labs  04/07/15 1550 04/08/15 0425  ALBUMIN 2.8* 2.8*   No results for input(s): LIPASE, AMYLASE in the last 72 hours. CBC:  Recent Labs  04/07/15 0422 04/08/15 0425  WBC 49.3* PENDING  HGB 11.5* 11.6*  HCT 38.1* 38.7*  MCV 99.2 100.3*  PLT 217 234   Cardiac Enzymes: No results for input(s): CKTOTAL, CKMB, CKMBINDEX, TROPONINI in the last 72 hours. BNP: Invalid input(s): POCBNP D-Dimer: No results for input(s): DDIMER in the last 72 hours. Hemoglobin A1C: No results for input(s): HGBA1C in the last 72 hours. Fasting Lipid Panel: No results for input(s): CHOL, HDL, LDLCALC, TRIG, CHOLHDL, LDLDIRECT in the last 72 hours. Thyroid Function Tests: No results for input(s): TSH, T4TOTAL, T3FREE, THYROIDAB in the last 72 hours.  Invalid input(s): FREET3 Anemia Panel: No results for input(s): VITAMINB12, FOLATE, FERRITIN, TIBC, IRON, RETICCTPCT in the last 72 hours.  RADIOLOGY: Dg Chest 2 View  04/03/2015  CLINICAL DATA:  Hypotension, hypoxia EXAM: CHEST  2 VIEW COMPARISON:  05/19/2012; 05/18/2012 FINDINGS: Grossly unchanged enlarged cardiac silhouette and mediastinal contours given reduced lung volumes. The pulmonary vasculature is indistinct with cephalization of flow. Interval development of small to moderate-sized bilateral effusions with associated worsening bilateral mid and lower lung heterogeneous opacities. No pneumothorax. No acute osseus abnormalities. IMPRESSION: Findings worrisome for pulmonary edema with small to moderate sized bilateral effusions and associated bibasilar  opacities, atelectasis versus infiltrate. Electronically Signed   By: Sandi Mariscal M.D.   On: 04/03/2015 11:37   Dg Chest Port 1 View  04/07/2015  CLINICAL DATA:  Right central line placement EXAM: PORTABLE CHEST 1 VIEW COMPARISON:  04/07/2015 FINDINGS: Right jugular central venous catheter with the tip projecting over the SVC. Small bilateral pleural effusions. Mild bilateral interstitial thickening. There is no pneumothorax. The heart and mediastinum are stable. The osseous structures are unremarkable. IMPRESSION: 1. Right jugular central venous catheter with the tip projecting over the SVC. 2. Mild CHF. Electronically Signed   By: Kathreen Devoid   On: 04/07/2015 14:54   Dg Chest Port 1 View  04/07/2015  CLINICAL DATA:  Assess right central line, after being accidentally tugged. Initial encounter. EXAM: PORTABLE CHEST 1 VIEW COMPARISON:  Chest radiograph performed 04/03/2015 FINDINGS: The patient's right IJ dual-lumen catheter has been retracted, now seen ending overlying the proximal SVC. Small bilateral pleural effusions are noted, larger on the right. Bibasilar airspace opacities may reflect mild interstitial edema or pneumonia. No pneumothorax is seen. The cardiomediastinal silhouette is borderline normal in size. No acute osseous abnormalities are identified. IMPRESSION: 1. Right IJ line has been retracted, now seen ending overlying the proximal SVC. 2. Small bilateral pleural effusions, larger on the right. Bibasilar airspace opacities may reflect mild interstitial edema or pneumonia. Electronically Signed   By: Garald Balding M.D.   On: 04/07/2015 01:46   Dg Chest Port 1 View  04/03/2015  CLINICAL DATA:  Encounter for central line placement. EXAM: PORTABLE CHEST - 1 VIEW COMPARISON:  Two-view chest x-ray from the same day. FINDINGS: The heart is enlarged. Atherosclerotic calcifications are present at the aortic arch. Bilateral pleural effusions are again noted. Any right IJ line is in place. There is  no pneumothorax. Lung volumes have slightly improved. IMPRESSION: 1. New right IJ line without radiographic evidence for complication. 2. Slightly improved lung volumes with persistent bilateral pleural effusions and mild diffuse edema. Electronically Signed   By: San Morelle M.D.   On: 04/03/2015 16:40    PHYSICAL EXAM General: NAD Neck: JVP 10-12 cm, no thyromegaly or thyroid nodule.  Lungs: Slight decreased breath sounds at bases.  CV: Nondisplaced PMI.  Heart irregular S1/S2, no S3/S4, 2/6 SEM RUSB.  No peripheral edema.   Abdomen: Soft, nontender, no hepatosplenomegaly, mild distention.  Neurologic: Alert and oriented x 3.  Psych: Normal affect. Extremities: No clubbing or cyanosis.   TELEMETRY: Reviewed telemetry pt in atrial fibrillation in 70s  ASSESSMENT AND PLAN: 75 yo with history of chronic atrial fibrillation, ESRD and diastolic CHF/RV failure presented with hypotension and volume overloaded.  1. Atrial fibrillation: Chronic. Has not been on anticoagulation, per him, because he "bruises too much." Should probably be back on coumadin eventually, will need to readdress this with him after RHC.  2. Acute on chronic diastolic CHF with prominent RV failure: Normal LV EF but severely dilated RV with severely decreased RV systolic function. He was admitted with  marked volume overload. He is getting CVVH but this has been limited by hypotension. He is doing better this morning, norepinephrine down to 2 and getting UF at 200 cc/hr.  CVP 12 and co-ox good at 72%.  - Continue UF at 200 cc/hr, would tolerate SBP 80s-90s as suspect this is where he is chronically.  - Can wean norepinephrine as able to keep SBP 80s-90s - Will plan RHC today. If PA pressure high and PCWP not markedly elevated, would consider use of pulmonary vasodilator (sildenafil). Continue to pull fluid off before RHC.  - Follow CVP.  3. Pulmonary hypertension: High PA pressure on echo and severe RV systolic  dysfunction. Possible pulmonary venous hypertension from LV diastolic dysfunction, but he has quite severe RV dysfunction so concern for intrinsic pulmonary vascular disease.  - RHC once he has had more fluid off. As above, would consider use of Revatio as long as PCWP is not too high. We discussed risks/benefits of the procedure and he agrees to have it done.  - Needs eventual V/Q scan and PFTs.  - Rheumatologic serologies.  4. ID: Initial concern for septic shock, but no infectious source found so far. I wonder if most of the problem is not RV failure with resulting hypotension/volume overload.  He continues on Zosyn.  Loralie Champagne 04/08/2015 7:45 AM

## 2015-04-08 NOTE — Progress Notes (Signed)
PULMONARY / CRITICAL CARE MEDICINE   Name: Erik Ochoa MRN: [id number] DOB: [birth year]    ADMISSION DATE:  04/03/2015 CONSULTATION DATE:  2/24  REFERRING MD:  Sabra Heck   CHIEF COMPLAINT:  Hypotension   BRIEF Hx:   75 year old male with a PMH of HTN, CHF, AF (not on AC), PH, Q2 week paracentesis at the [place] and ESRD who presented 2/24 to an outpatient vascular center for planned left balloon angioplasty of L AVF.  Post procedure had ongoing hypotension and was referred to ED for evaluation.  Admitted with working dx of sepsis (doubt infection).  To ICU for CVVHD in the setting of shock thought related to pulmonary hypertension on levophed.  For RHC 3/1 (? Need for pulmonary vasodilators).     SUBJECTIVE: No distress, alert and watching TV. Denies pain or SOB. Weaning Levophed, almost off at 45mcg/min with SBP 90s, on midodrine. CRRT ongoing with 150-200 ml/hr off. Increasing white count this am; no fever. Co-ox 72.3.  VITAL SIGNS: BP 92/58 mmHg  Pulse 0  Temp(Src) 97.3 F (36.3 C) (Oral)  Resp 0  Ht 6\' 4"  (1.93 m)  Wt 167 lb 5.3 oz (75.9 kg)  BMI 20.38 kg/m2  SpO2 0%  HEMODYNAMICS: CVP:  [12 mmHg-13 mmHg] 13 mmHg  VENTILATOR SETTINGS:    INTAKE / OUTPUT: I/O last 3 completed shifts: In: 1182.6 [P.O.:120; I.V.:762.6; IV Piggyback:300] Out: [id number] [Other:4920]  PHYSICAL EXAMINATION: General:  Chronically ill, cachectic  appearing white male, resting in bed. No distress.  Neuro:  Awake, oriented. No focal def  HEENT:  Poor dentition. No JVD. MMM  Cardiovascular:  IR IR , II/VI sem at LSB, no thrill or bruit between stitches on AV graft- left wrist, +thrill below site Lungs:  Clear, decreased bases, no accessory muscle use  Abdomen:  proturbrant abd, non tender, + bowel sounds Musculoskeletal:  Equal st and bulk, Mild swelling of LUE. No erythema.  Skin:  Mild abrasion on the left elbow, warm/dry, ulceration on R shoulder  LABS:  BMET  Recent Labs Lab  04/07/15 0422 04/07/15 1550 04/08/15 0425  NA 141 137 140  K 4.7 4.5 4.4  CL 104 102 102  CO2 26 24 27   BUN 16 18 13   CREATININE 2.44* 2.59* 2.14*  GLUCOSE 125* 164* 110*    Electrolytes  Recent Labs Lab 04/06/15 0520  04/07/15 0422 04/07/15 1550 04/08/15 0425  CALCIUM 7.8*  < > 8.4* 8.2* 8.4*  MG 2.3  --  2.4  --  2.6*  PHOS 4.3  < > 3.8 3.8 3.3  < > = values in this interval not displayed.  CBC  Recent Labs Lab 04/06/15 0520 04/07/15 0422 04/08/15 0425  WBC 39.0* 49.3* 51.2*  HGB 12.0* 11.5* 11.6*  HCT 39.7 38.1* 38.7*  PLT 228 217 234    Coag's  Recent Labs Lab 04/03/15 1615  04/06/15 0520 04/07/15 0422 04/08/15 0425  APTT 32  < > 38* 33 37  INR 1.36  --   --   --   --   < > = values in this interval not displayed.  Sepsis Markers  Recent Labs Lab 04/03/15 1113 04/03/15 1615 04/03/15 1658  LATICACIDVEN 1.56  --  1.57  PROCALCITON  --  0.59  --     ABG No results for input(s): PHART, PCO2ART, PO2ART in the last 168 hours.  Liver Enzymes  Recent Labs Lab 04/03/15 1059 04/03/15 1615  04/07/15 0422 04/07/15 1550 04/08/15 0425  AST 17  15  --   --   --   --   ALT 13* 10*  --   --   --   --   ALKPHOS 66 60  --   --   --   --   BILITOT 0.6 0.7  --   --   --   --   ALBUMIN 2.7* 2.4*  < > 2.9* 2.8* 2.8*  < > = values in this interval not displayed.  Cardiac Enzymes  Recent Labs Lab 04/03/15 1059 04/03/15 1615  TROPONINI 0.13* 0.09*    Glucose  Recent Labs Lab 04/03/15 1819 04/03/15 2057  GLUCAP 75 95    Imaging Dg Chest Port 1 View  04/07/2015  CLINICAL DATA:  Right central line placement EXAM: PORTABLE CHEST 1 VIEW COMPARISON:  04/07/2015 FINDINGS: Right jugular central venous catheter with the tip projecting over the SVC. Small bilateral pleural effusions. Mild bilateral interstitial thickening. There is no pneumothorax. The heart and mediastinum are stable. The osseous structures are unremarkable. IMPRESSION: 1. Right  jugular central venous catheter with the tip projecting over the SVC. 2. Mild CHF. Electronically Signed   By: Kathreen Devoid   On: 04/07/2015 14:54     STUDIES:  ECHO 02/27 - LV EF 60-65%, D-shaped interventricular septum suggestive of RV pressure/volume overload, severely dilated RV with severe RV systolic dysfunction, PASP 59 mmHg. Moderate AS, mild MR.  RUE [place] 2/27 >> neg for DVT, no abscess RHC 3/1 >> normal PCWP (9), elevated right sided filling pressures suggestive of R heart failure, moderated PAH (53/17)  CULTURES: BCX2 2/24 >>   ANTIBIOTICS: Vanc 2/24 >> 2/27 Zosyn 2/24 >>  SIGNIFICANT EVENTS: 2/24  Admitted with hypotension, concern for septic shock from Cellulitis.  CRRT initiated.  2/27  Paracentesis completed; 4.5L off 2/28  Trialysis cath partially pulled out by patient; replaced, CRRT continues 3/01  RHC for persistent hypotension, thought related to [facility]   LINES/TUBES: Dialysis Catheter R IJ 2/24 >>  DISCUSSION: 75 year old male w/ ESRD. Takes midodrine for hypotension on dialysis days (BP in 90s). Last HD was Tuesday 2/12. Went to out-pt vascular procedure facility on 2/24 for elective balloon angioplasty of L AVF. Post-procedure developed persistent hypotension w/ room air sats 88% and BP in 70s. No clear source of infection.  Hypotension thought related to pulmonary hypertension.  Pending RHC.     ASSESSMENT / PLAN:  PULMONARY A: Pleural effusions / pulmonary edema  Pulmonary Hypertension - moderate PAH on RHC P:   Pulmonary hygiene O2 as needed CRRT for even balance as tolerated   Begin Revatio   CARDIOVASCULAR A:  Septic shock - source unclear, low dose levophed continues.  ? Baseline BP readings Chronic Afib - rate controlled. Not on anticoagulation per patient Hypotensive - likely baseline hypotension.  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  P:  Cortisol  slightly low > stress steroids Attempting wean of pressors. Goal pressures [id number] systolic.  Records from [place] no anticoagulation on med list. No cardiology notes. Holding for now.  Continue midodrine TID  Revatio as above  RENAL A:   ESRD - on HD, does not make urine AG metabolic acidosis - resolved   Mild hyperkalemia  P:   Renal input appreciated, CVVHD ongoing  Fluid balance titrated to blood pressure.  Should not be on KCL at home ? Patency of AVF  Trend BMP  GASTROINTESTINAL A:   Frequent paracentesis - at the [place], records indicate ESRD associated ascites ?? Hypoalbuminemia  ? Liver disease > could possibly explain baseline hypotension. No evidence.  Ascites due to RHF P:   Renal diet as tolerated Aspiration precautions, SLP following  Few hallmarks of cirrhosis on lab eval.    HEMATOLOGIC A:   Anemia of chronic disease  Leukocytosis - demargination vs sepsis. Also steroids.  P:  Trend cbc  [place] heparin Transfuse per ICU protocol   INFECTIOUS A:   R/o septic shock - currently potential sources include LE cellulitis; but also consider transient bacteremia (possibly infected AVF? ) No clear source.  Doubt infectious etiology of hypotension.  Leukocytosis - Afebrile, on stress dose steroids P:   Trend CBC Cultures / ABX as above Monitor fever curve   ENDOCRINE A:   Hypothyroidism  At Risk Hyperglycemia in setting of stress dose steroids  Adrenal Insufficiency  P:   Cont synthroid  Stress dose steroids, consider wean / d/c once off levophed  NEUROLOGIC A:   H/o peripheral neuropathy  P:  Hold sedating meds   FAMILY  - Updates:  Patient updated on plan of care.  No family at bedside.   - Inter-disciplinary family meet or Palliative Care meeting due by: 3/2    Noe Gens, NP-C [place] Pgr: [phone] or if no answer [phone] 04/08/2015, 2:07 PM

## 2015-04-09 DIAGNOSIS — J90 Pleural effusion, not elsewhere classified: Secondary | ICD-10-CM

## 2015-04-09 DIAGNOSIS — Z452 Encounter for adjustment and management of vascular access device: Secondary | ICD-10-CM | POA: Diagnosis present

## 2015-04-09 LAB — CULTURE, BLOOD (ROUTINE X 2)
CULTURE: NO GROWTH
Culture: NO GROWTH

## 2015-04-09 LAB — RENAL FUNCTION PANEL
ALBUMIN: 2.9 g/dL — AB (ref 3.5–5.0)
ALBUMIN: 3 g/dL — AB (ref 3.5–5.0)
ANION GAP: 7 (ref 5–15)
ANION GAP: 13 (ref 5–15)
BUN: 11 mg/dL (ref 6–20)
BUN: 11 mg/dL (ref 6–20)
CALCIUM: 8.5 mg/dL — AB (ref 8.9–10.3)
CHLORIDE: 105 mmol/L (ref 101–111)
CO2: 26 mmol/L (ref 22–32)
CO2: 23 mmol/L (ref 22–32)
CREATININE: 1.89 mg/dL — AB (ref 0.61–1.24)
Calcium: 8.3 mg/dL — ABNORMAL LOW (ref 8.9–10.3)
Chloride: 102 mmol/L (ref 101–111)
Creatinine, Ser: 1.9 mg/dL — ABNORMAL HIGH (ref 0.61–1.24)
GFR calc non Af Amer: 33 mL/min — ABNORMAL LOW (ref >60)
GFR, EST AFRICAN AMERICAN: 38 mL/min — AB (ref >60)
GFR, EST AFRICAN AMERICAN: 39 mL/min — AB (ref >60)
GFR, EST NON AFRICAN AMERICAN: 33 mL/min — AB (ref >60)
GLUCOSE: 154 mg/dL — AB (ref 65–99)
Glucose, Bld: 110 mg/dL — ABNORMAL HIGH (ref 65–99)
PHOSPHORUS: 2.5 mg/dL (ref 2.5–4.6)
PHOSPHORUS: 2.6 mg/dL (ref 2.5–4.6)
POTASSIUM: 4.5 mmol/L (ref 3.5–5.1)
Potassium: 4.4 mmol/L (ref 3.5–5.1)
SODIUM: 138 mmol/L (ref 135–145)
Sodium: 138 mmol/L (ref 135–145)

## 2015-04-09 LAB — CBC
HEMATOCRIT: 40.1 % (ref 39.0–52.0)
HEMOGLOBIN: 11.8 g/dL — AB (ref 13.0–17.0)
MCH: 29.7 pg (ref 26.0–34.0)
MCHC: 29.4 g/dL — ABNORMAL LOW (ref 30.0–36.0)
MCV: 101 fL — AB (ref 78.0–100.0)
Platelets: 224 10*3/uL (ref 150–400)
RBC: 3.97 MIL/uL — AB (ref 4.22–5.81)
RDW: 20.1 % — ABNORMAL HIGH (ref 11.5–15.5)
WBC: 48.3 10*3/uL — AB (ref 4.0–10.5)

## 2015-04-09 LAB — APTT: APTT: 33 s (ref 24–37)

## 2015-04-09 LAB — LACTIC ACID, PLASMA: Lactic Acid, Venous: 1.1 mmol/L (ref 0.5–2.0)

## 2015-04-09 LAB — MAGNESIUM: Magnesium: 2.5 mg/dL — ABNORMAL HIGH (ref 1.7–2.4)

## 2015-04-09 LAB — PROCALCITONIN: PROCALCITONIN: 0.53 ng/mL

## 2015-04-09 MED ORDER — CETYLPYRIDINIUM CHLORIDE 0.05 % MT LIQD
7.0000 mL | Freq: Two times a day (BID) | OROMUCOSAL | Status: DC
  Administered 2015-04-09 – 2015-04-15 (×9): 7 mL via OROMUCOSAL

## 2015-04-09 NOTE — Progress Notes (Signed)
Patient ID: Erik Ochoa, male   DOB: 1940-07-21, 75 y.o.   MRN: [id number]   SUBJECTIVE:   RHC yesterday with normal PCWP but elevated R-sided filling pressures suggestive of RV failure. 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  Revatio 20 mg TID.   Stable. norepinephrine remains at 2.  SBP 90s with drops to 70-80s. Denies lightheadedness or dizziness. CVVH ongoing, UF up to 250 cc/hr. CVP 9. Co-ox 53% today. Remains on norepi at 2.  Out 4 L yesterday and 1.8 L so far today.  Weight shows down 17 lbs. ? accuracy.  Echo: LV EF 60-65%, D-shaped interventricular septum suggestive of RV pressure/volume overload, severely dilated RV with severe RV systolic dysfunction, PASP 59 mmHg. Moderate AS, mild MR.   RHC Procedural Findings: Hemodynamics (mmHg) RA mean 14 RV 48/13 PA 53/17, mean 30 PCWP mean 9  Oxygen saturations: PA 53% AO 98%  Cardiac Output (Fick) 3.84  Cardiac Index (Fick) 1.87 PVR 5.5 WU  Cardiac Output (Thermodilution) 4.27 Cardiac Index (Thermodilution) 2.08  PVR 4.91 WU  Scheduled Meds: . antiseptic oral rinse  7 mL Mouth Rinse BID  . calcium acetate  1,334 mg Oral Q supper  . doxercalciferol  1 mcg Intravenous Q T,Th,Sa-HD  . heparin  5,000 Units Subcutaneous 3 times per day  . hydrocortisone sod succinate (SOLU-CORTEF) inj  50 mg Intravenous Q6H  . levothyroxine  50 mcg Oral QAC breakfast  . midodrine  20 mg Oral TID WC  . piperacillin-tazobactam  3.375 g Intravenous 4 times per day  . sildenafil  20 mg Oral TID   Continuous Infusions: . norepinephrine (LEVOPHED) Adult infusion 1.5 mcg/min (04/09/15 0644)  . dialysis replacement fluid (prismasate) 400 mL/hr at 04/09/15 0353  . dialysis replacement fluid (prismasate) 200 mL/hr at 04/08/15 1459  . dialysate (PRISMASATE) 1,700 mL/hr at 04/09/15 0238   PRN Meds:.sodium chloride, acetaminophen, alteplase, alteplase, heparin, heparin, ondansetron (ZOFRAN) IV,  pentafluoroprop-tetrafluoroeth, sodium chloride    Filed Vitals:   04/09/15 0400 04/09/15 0500 04/09/15 0600 04/09/15 0700  BP: 94/62 80/60 94/59  78/53  Pulse: 79 59 66 61  Temp:    97.9 F (36.6 C)  TempSrc:    Oral  Resp: 15 12 16 13   Height:      Weight:      SpO2: 98% 96% 98% 93%    Intake/Output Summary (Last 24 hours) at 04/09/15 0917 Last data filed at 04/09/15 0700  Gross per 24 hour  Intake  669.9 ml  Output   3917 ml  Net -3247.1 ml    LABS: Basic Metabolic Panel:  Recent Labs  04/08/15 0425 04/08/15 1645 04/09/15 0350  NA 140 138 138  K 4.4 4.3 4.5  CL 102 104 105  CO2 27 24 26   GLUCOSE 110* 119* 110*  BUN 13 12 11   CREATININE 2.14* 2.14* 1.90*  CALCIUM 8.4* 8.3* 8.3*  MG 2.6*  --  2.5*  PHOS 3.3 3.2 2.5   Liver Function Tests:  Recent Labs  04/08/15 1645 04/09/15 0350  ALBUMIN 2.6* 2.9*   No results for input(s): LIPASE, AMYLASE in the last 72 hours. CBC:  Recent Labs  04/08/15 0425 04/09/15 0350  WBC 51.2* 48.3*  HGB 11.6* 11.8*  HCT 38.7* 40.1  MCV 100.3* 101.0*  PLT 234 224   Cardiac Enzymes: No results for input(s): CKTOTAL, CKMB, CKMBINDEX, TROPONINI in the last 72 hours. BNP: Invalid input(s): POCBNP D-Dimer: No results for input(s): DDIMER in the last  72 hours. Hemoglobin A1C: No results for input(s): HGBA1C in the last 72 hours. Fasting Lipid Panel: No results for input(s): CHOL, HDL, LDLCALC, TRIG, CHOLHDL, LDLDIRECT in the last 72 hours. Thyroid Function Tests: No results for input(s): TSH, T4TOTAL, T3FREE, THYROIDAB in the last 72 hours.  Invalid input(s): FREET3 Anemia Panel: No results for input(s): VITAMINB12, FOLATE, FERRITIN, TIBC, IRON, RETICCTPCT in the last 72 hours.  RADIOLOGY: Dg Chest 2 View  04/03/2015  CLINICAL DATA:  Hypotension, hypoxia EXAM: CHEST  2 VIEW COMPARISON:  05/19/2012; 05/18/2012 FINDINGS: Grossly unchanged enlarged cardiac silhouette and mediastinal contours given reduced lung  volumes. The pulmonary vasculature is indistinct with cephalization of flow. Interval development of small to moderate-sized bilateral effusions with associated worsening bilateral mid and lower lung heterogeneous opacities. No pneumothorax. No acute osseus abnormalities. IMPRESSION: Findings worrisome for pulmonary edema with small to moderate sized bilateral effusions and associated bibasilar opacities, atelectasis versus infiltrate. Electronically Signed   By: Sandi Mariscal M.D.   On: 04/03/2015 11:37   Dg Chest Port 1 View  04/07/2015  CLINICAL DATA:  Right central line placement EXAM: PORTABLE CHEST 1 VIEW COMPARISON:  04/07/2015 FINDINGS: Right jugular central venous catheter with the tip projecting over the SVC. Small bilateral pleural effusions. Mild bilateral interstitial thickening. There is no pneumothorax. The heart and mediastinum are stable. The osseous structures are unremarkable. IMPRESSION: 1. Right jugular central venous catheter with the tip projecting over the SVC. 2. Mild CHF. Electronically Signed   By: Kathreen Devoid   On: 04/07/2015 14:54   Dg Chest Port 1 View  04/07/2015  CLINICAL DATA:  Assess right central line, after being accidentally tugged. Initial encounter. EXAM: PORTABLE CHEST 1 VIEW COMPARISON:  Chest radiograph performed 04/03/2015 FINDINGS: The patient's right IJ dual-lumen catheter has been retracted, now seen ending overlying the proximal SVC. Small bilateral pleural effusions are noted, larger on the right. Bibasilar airspace opacities may reflect mild interstitial edema or pneumonia. No pneumothorax is seen. The cardiomediastinal silhouette is borderline normal in size. No acute osseous abnormalities are identified. IMPRESSION: 1. Right IJ line has been retracted, now seen ending overlying the proximal SVC. 2. Small bilateral pleural effusions, larger on the right. Bibasilar airspace opacities may reflect mild interstitial edema or pneumonia. Electronically Signed   By:  Garald Balding M.D.   On: 04/07/2015 01:46   Dg Chest Port 1 View  04/03/2015  CLINICAL DATA:  Encounter for central line placement. EXAM: PORTABLE CHEST - 1 VIEW COMPARISON:  Two-view chest x-ray from the same day. FINDINGS: The heart is enlarged. Atherosclerotic calcifications are present at the aortic arch. Bilateral pleural effusions are again noted. Any right IJ line is in place. There is no pneumothorax. Lung volumes have slightly improved. IMPRESSION: 1. New right IJ line without radiographic evidence for complication. 2. Slightly improved lung volumes with persistent bilateral pleural effusions and mild diffuse edema. Electronically Signed   By: San Morelle M.D.   On: 04/03/2015 16:40    PHYSICAL EXAM General: NAD Neck: JVP 9-10 cm, no thyromegaly or thyroid nodule.  Lungs: Slight decreased breath sounds at bases.  CV: Nondisplaced PMI.  Heart irregular S1/S2, no S3/S4, 2/6 SEM RUSB.  No peripheral edema.   Abdomen: Soft, NT, ND, no HSM. No bruits or masses. +BS.  Neurologic: Alert and oriented x 3.  Psych: Normal affect. Extremities: No clubbing or cyanosis.   TELEMETRY: Reviewed telemetry pt in atrial fibrillation in 60-70s  ASSESSMENT AND PLAN: 74 yo with history of chronic  atrial fibrillation, ESRD and diastolic CHF/RV failure presented with hypotension and volume overloaded.  1. Atrial fibrillation: Chronic. Has not been on anticoagulation, per him, because he "bruises too much."  - Should probably be back on coumadin eventually 2. Acute on chronic diastolic CHF with prominent RV failure: Normal LV EF but severely dilated RV with severely decreased RV systolic function. He was admitted with marked volume overload. He is getting CVVH but this has been limited by hypotension.  - Remains on norepinephrine at 2 and getting UF at 250 cc/hr.  CVP 9. Co-ox marginal at 53% today.  Though he feels ok.  - Continue UF at 250 cc/hr, would tolerate SBP 80s-90s as suspect this is  where he is chronically.  - Can wean norepinephrine as able to keep SBP 80s-90s. May need to hold at 2 today with starting revatio.  - RHC yesterday with elevated filling pressures but normal PCWP, primarily RV failure. Revatio started for [facility].  3. Pulmonary hypertension: High PA pressure on echo and severe RV systolic dysfunction. Possible pulmonary venous hypertension from LV diastolic dysfunction, but he has quite severe RV dysfunction so concern for intrinsic pulmonary vascular disease.  - As above, RHC yesterday with normal PCWP but elevated filling pressures.  - Needs eventual V/Q scan and PFTs.  - Rheumatologic serologies.  - Continue revatio 20 mg TID.  4. ID: Initial concern for septic shock, but no infectious source found so far. I wonder if most of the problem is not RV failure with resulting hypotension/volume overload.  He continues on Zosyn.  Shirley Friar PA-C 04/09/2015 9:17 AM  Advanced Heart Failure Team Pager [phone] (M-F; 7a - 4p)  Please contact [facility] for night-coverage after hours (4p -7a ) and weekends on [email or web address]  Patient seen with PA, agree with the above note. CVP down to 10 range. Ongoing UF via CVVH per renal.    He remains on norepinephrine at low dose.  Could probably cut back on UF rate at this point and see if we can wean off norepi, would tolerate SBP 80s.   Revatio 20 mg tid started yesterday with RV failure and pulmonary hypertension.   Loralie Champagne 04/09/2015 1:07 PM

## 2015-04-09 NOTE — Progress Notes (Signed)
Pharmacy Antibiotic Note Erik Ochoa is a 76 y.o. male admitted on 04/03/2015 with lethargy and LE erythema with concern for sepsis in setting of ESRD. Pharmacy has been consulted for Zosyn dosing. Pt was started on CRRT.   Plan: 1. Continue zosyn 3.375 grams IV every 6 hours  2. Daily CBC 3. F/u abx stop date 4. F/u plans for CRRT stop date  Temp (24hrs), Avg:97.7 F (36.5 C), Min:97.3 F (36.3 C), Max:98 F (36.7 C)   Recent Labs Lab 04/03/15 1113  04/03/15 1658  04/05/15 0415  04/06/15 0520  04/07/15 0422 04/07/15 1550 04/08/15 0425 04/08/15 1645 04/09/15 0350 04/09/15 0635  WBC  --   < >  --   < > 23.4*  --  39.0*  --  49.3*  --  51.2*  --  48.3*  --   CREATININE  --   < >  --   < > 5.58*  < > 3.37*  < > 2.44* 2.59* 2.14* 2.14* 1.90*  --   LATICACIDVEN 1.56  --  1.57  --   --   --   --   --   --   --   --   --   --  1.1  < > = values in this interval not displayed.  Estimated Creatinine Clearance: 32.9 mL/min (by C-G formula based on Cr of 1.9).    No Known Allergies  Antimicrobials this admission: 2/24 Zosyn >>  2/24 Vancomycin >> 2/27  Dose adjustments this admission: n/a  Microbiology results: 2/24 BCx: ngtd 2/24 MRSA PCR neg  Thank you for allowing pharmacy to be a part of this patient's care.  Melburn Popper, PharmD Clinical Pharmacy Resident Pager: [phone] 04/09/2015 9:42 AM

## 2015-04-09 NOTE — Progress Notes (Signed)
[facility] Progress Note   Subjective:  RHC yesterday, nl PCWP, high RV pr essures , started sildenafil 3.6L neg on CRRT Remains on low dose NE    Filed Vitals:   04/09/15 0400 04/09/15 0500 04/09/15 0600 04/09/15 0700  BP: 94/62 80/60 94/59  78/53  Pulse: 79 59 66 61  Temp:    97.9 F (36.6 C)  TempSrc:    Oral  Resp: 15 12 16 13   Height:      Weight:      SpO2: 98% 96% 98% 93%    Inpatient medications: . antiseptic oral rinse  7 mL Mouth Rinse BID  . calcium acetate  1,334 mg Oral Q supper  . doxercalciferol  1 mcg Intravenous Q T,Th,Sa-HD  . heparin  5,000 Units Subcutaneous 3 times per day  . hydrocortisone sod succinate (SOLU-CORTEF) inj  50 mg Intravenous Q6H  . levothyroxine  50 mcg Oral QAC breakfast  . midodrine  20 mg Oral TID WC  . piperacillin-tazobactam  3.375 g Intravenous 4 times per day  . sildenafil  20 mg Oral TID   . norepinephrine (LEVOPHED) Adult infusion 1.5 mcg/min (04/09/15 0644)  . dialysis replacement fluid (prismasate) 400 mL/hr at 04/09/15 0353  . dialysis replacement fluid (prismasate) 200 mL/hr at 04/08/15 1459  . dialysate (PRISMASATE) 1,700 mL/hr at 04/09/15 0238   sodium chloride, acetaminophen, alteplase, alteplase, heparin, heparin, ondansetron (ZOFRAN) IV, pentafluoroprop-tetrafluoroeth, sodium chloride  Exam: General:better appearing WM  Neck: Supple. JVD not elevated. R IJ TDC Lungs: Dim BS Breathing is unlabored. Heart: RRR 2/6 murmur Abdomen: Soft, 2+ ascites as usual Ext: 1+ depend hip/ thigh edema bilat Neuro: Alert and oriented X 3. Moves all ext Dialysis Access: left lower can palpate thrill distally but not between the stitches- may not be patent    Current CRRT Prescription: Start Date: 2/24 Catheter: R IJ BFR: 250 Pre Blood Pump: 400 4K DFR: 1700 4K Replacement Rate: 200 4K Goal UF: 274mL/hr neg Anticoagulation: none in circuit Clotting: none   Dialysis Orders: [facility] 4.15 180 450/800  EDW 76 2 K 2.25 Ca no profile left lower AVF hectorol 1 heparin 8400 venofer 50 2/16 - last hgb 11.5   Assessment/Plan: 1. Sepsis with persistent leukocytosis: IV ABX on weening NE and midodrine.  All Cx NGTD; on stress steroids 2. ESRD - With a poorly functioning AVF S/p access angioplasty the morning of admission. On CRRT for now via vascath placed 2/24- numbers much better 3. Chronic hypotension - back on 20 tid of midodrine as at home.  On a tad of NE now, cont midodrine CVP down to 10 4. Anemia - Hb stable - hold venofer for now.  5. Metabolic bone disease - Last iPTH 436 - on hectorol 1 - -probably needs increased - no adjustment made after Jan labs.- wait on change for now; phoslo 3 ac tid 6. Nutrition - poor alb 2.7  7. Chronic ascites - requires periodic paracentesis R sided CHF with pulmonary HTN: RHC 3/1 with nl PCWP. Now on Sildenafil for pHTN  Plan - Will cont CRRT for more UF today.  Labs stable.    Pearson Grippe B   04/09/2015, 8:16 AM    Recent Labs Lab 04/08/15 0425 04/08/15 1645 04/09/15 0350  NA 140 138 138  K 4.4 4.3 4.5  CL 102 104 105  CO2 27 24 26   GLUCOSE 110* 119* 110*  BUN 13 12 11   CREATININE 2.14* 2.14* 1.90*  CALCIUM 8.4* 8.3* 8.3*  PHOS 3.3 3.2 2.5    Recent Labs Lab 04/03/15 1059 04/03/15 1615  04/08/15 0425 04/08/15 1645 04/09/15 0350  AST 17 15  --   --   --   --   ALT 13* 10*  --   --   --   --   ALKPHOS 66 60  --   --   --   --   BILITOT 0.6 0.7  --   --   --   --   PROT 5.3* 4.4*  --   --   --   --   ALBUMIN 2.7* 2.4*  < > 2.8* 2.6* 2.9*  < > = values in this interval not displayed.  Recent Labs Lab 04/03/15 1059  04/07/15 0422 04/08/15 0425 04/09/15 0350  WBC 24.6*  < > 49.3* 51.2* 48.3*  NEUTROABS 21.8*  --   --   --   --   HGB 11.3*  < > 11.5* 11.6* 11.8*  HCT 35.1*  < > 38.1* 38.7* 40.1  MCV 96.2  < > 99.2 100.3* 101.0*  PLT 191  < > 217 234 224  < > = values in this interval not displayed.

## 2015-04-09 NOTE — Progress Notes (Signed)
Speech Language Pathology Treatment: Dysphagia  Patient Details Name: Erik Ochoa MRN: [id number] DOB: [birth year] Today's Date: 04/09/2015 Time: [id number] SLP Time Calculation (min) (ACUTE ONLY): 30 min  Assessment / Plan / Recommendation Clinical Impression  Pt seen with am meal. Again initially, pt with no s/s of aspiration- swallow timely and strong. As meal progressed however pt began coughing following both bites and sips, with very frequent coughing by the end of meal. Pt began expectorating mucous at the end. Pt again reports that this is new. Strongly suspect esophageal involvement with build up of possible esophageal stasis with backflow or referred sensation of stasis leading to coughing. SLP could not eliminate coughing, but was able to provide some new precautions/strategies that may reduce risk. Position very important; pt cannot be up to chair given CVVHD, but needs to be pulled up to the top of the bed for less pressure on esophagus from abdomen. Also suggest pt eat portions of meal and stop for a 15-30 minute rest when coughing begins. Will f/u for MBS when pt off CVVHD.     HPI HPI: 75 year old male w/ ESRD. Has been in [place] w/out complaints. Presented to Out-pt vascular center for planned left balloon angioplasty of Left AVF on 2/24. Procedure unremarkable but was hypotensive persistently after. Found to have pulmonary HTN. Is on CVVHD. RN reports he coughs during meals.       SLP Plan  Continue with current plan of care     Recommendations  Diet recommendations: Regular;Thin liquid Liquids provided via: Cup Medication Administration: Whole meds with liquid Supervision: Patient able to self feed Compensations: Slow rate;Small sips/bites;Follow solids with liquid Postural Changes and/or Swallow Maneuvers: Seated upright 90 degrees;Upright 30-60 min after meal;Out of bed for meals             Oral Care Recommendations: Oral care BID Plan: Continue with current  plan of care     [place] Erik Rufo, MA CCC-SLP [id number]  Lynann Beaver 04/09/2015, 11:34 AM

## 2015-04-09 NOTE — Progress Notes (Signed)
PULMONARY / CRITICAL CARE MEDICINE   Name: Erik Ochoa MRN: [id number] DOB: 27-Nov-1940    ADMISSION DATE:  04/03/2015 CONSULTATION DATE:  2/24  REFERRING MD:  Sabra Heck   CHIEF COMPLAINT:  Hypotension   BRIEF Hx:   75 year old male with a PMH of HTN, CHF, AF (not on AC), PH, Q2 week paracentesis at the [place] and ESRD who presented 2/24 to an outpatient vascular center for planned left balloon angioplasty of L AVF.  Post procedure had ongoing hypotension and was referred to ED for evaluation.  Admitted with working dx of sepsis (doubt infection).  To ICU for CVVHD in the setting of shock thought related to pulmonary hypertension on levophed.  For RHC 3/1 (? Need for pulmonary vasodilators).     SUBJECTIVE: cvvhd maintained, remaines on levophed , rhc done, sild started  VITAL SIGNS: BP 94/59 mmHg  Pulse 66  Temp(Src) 98 F (36.7 C) (Oral)  Resp 16  Ht 6\' 4"  (1.93 m)  Wt 68.2 kg (150 lb 5.7 oz)  BMI 18.31 kg/m2  SpO2 98%  HEMODYNAMICS: CVP:  [10 mmHg-15 mmHg] 10 mmHg  VENTILATOR SETTINGS:    INTAKE / OUTPUT: I/O last 3 completed shifts: In: 1207.5 [P.O.:350; I.V.:607.5; IV Piggyback:250] Out: [id number] [Other:5875]  PHYSICAL EXAMINATION: General:  Chronically ill, cachectic  appearing white male, no distress Neuro:  Awake, oriented. Nonfocal HEENT:  Poor dentition. No JVD. MMM  Cardiovascular:  IR IR , II/VI sem at [facility] Lungs: CTA Abdomen:  Soft, non tender, + bowel sounds Musculoskeletal:  Equal st and bulk, Mild swelling of LUE. No erythema.  Skin:   warm/dry, ulceration on R shoulder  LABS:  BMET  Recent Labs Lab 04/08/15 0425 04/08/15 1645 04/09/15 0350  NA 140 138 138  K 4.4 4.3 4.5  CL 102 104 105  CO2 27 24 26   BUN 13 12 11   CREATININE 2.14* 2.14* 1.90*  GLUCOSE 110* 119* 110*    Electrolytes  Recent Labs Lab 04/07/15 0422  04/08/15 0425 04/08/15 1645 04/09/15 0350  CALCIUM 8.4*  < > 8.4* 8.3* 8.3*  MG 2.4  --  2.6*  --  2.5*  PHOS 3.8   < > 3.3 3.2 2.5  < > = values in this interval not displayed.  CBC  Recent Labs Lab 04/07/15 0422 04/08/15 0425 04/09/15 0350  WBC 49.3* 51.2* 48.3*  HGB 11.5* 11.6* 11.8*  HCT 38.1* 38.7* 40.1  PLT 217 234 224    Coag's  Recent Labs Lab 04/03/15 1615  04/07/15 0422 04/08/15 0425 04/09/15 0350  APTT 32  < > 33 37 33  INR 1.36  --   --   --   --   < > = values in this interval not displayed.  Sepsis Markers  Recent Labs Lab 04/03/15 1113 04/03/15 1615 04/03/15 1658  LATICACIDVEN 1.56  --  1.57  PROCALCITON  --  0.59  --     ABG No results for input(s): PHART, PCO2ART, PO2ART in the last 168 hours.  Liver Enzymes  Recent Labs Lab 04/03/15 1059 04/03/15 1615  04/08/15 0425 04/08/15 1645 04/09/15 0350  AST 17 15  --   --   --   --   ALT 13* 10*  --   --   --   --   ALKPHOS 66 60  --   --   --   --   BILITOT 0.6 0.7  --   --   --   --  ALBUMIN 2.7* 2.4*  < > 2.8* 2.6* 2.9*  < > = values in this interval not displayed.  Cardiac Enzymes  Recent Labs Lab 04/03/15 1059 04/03/15 1615  TROPONINI 0.13* 0.09*    Glucose  Recent Labs Lab 04/03/15 1819 04/03/15 2057  GLUCAP 75 95    Imaging No results found.   STUDIES:  ECHO 02/27 - LV EF 60-65%, D-shaped interventricular septum suggestive of RV pressure/volume overload, severely dilated RV with severe RV systolic dysfunction, PASP 59 mmHg. Moderate AS, mild MR.  RUE [place] 2/27 >> neg for DVT, no abscess RHC 3/1 >> normal PCWP (9), elevated right sided filling pressures suggestive of R heart failure, moderated PAH (53/17)  CULTURES: BCX2 2/24 >>   ANTIBIOTICS: Vanc 2/24 >> 2/27 Zosyn 2/24 >>  SIGNIFICANT EVENTS: 2/24  Admitted with hypotension, concern for septic shock from Cellulitis.  CRRT initiated.  2/27  Paracentesis completed; 4.5L off 2/28  Trialysis cath partially pulled out by patient; replaced, CRRT continues 3/01  RHC for persistent hypotension, thought related to [facility]    LINES/TUBES: Dialysis Catheter R IJ 2/24 >>  DISCUSSION: 75 year old male w/ ESRD. Takes midodrine for hypotension on dialysis days (BP in 90s). Last HD was Tuesday 2/12. Went to out-pt vascular procedure facility on 2/24 for elective balloon angioplasty of L AVF. Post-procedure developed persistent hypotension w/ room air sats 88% and BP in 70s. No clear source of infection.  Hypotension thought related to pulmonary hypertension.  Pending RHC.    ASSESSMENT / PLAN:  PULMONARY A: Pleural effusions / pulmonary edema  Pulmonary Hypertension - moderate PAH on RHC P:   Pulmonary hygiene O2 as needed CRRT for negative successful to the tune of 3 liters last 24 hrs, overall BP tolerated Begin Revatio , observe NO role tap rt effusion from abdo, no distress  CARDIOVASCULAR A:  Septic shock - source unclear, low dose levophed continues.  ? Baseline BP readings Chronic Afib - rate controlled. Not on anticoagulation per patient Hypotensive - likely baseline hypotension.  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  P:  Cortisol slightly low > stress steroids, maintain until off pressors Attempting wean of pressors. Goal pressures [id number] systolic.  Levophed to this goal, would attempt other sites BP cuff svo2 reassuring Lactic x 1 Records from [place] no anticoagulation on med list. No cardiology notes. Holding for now.  Continue midodrine TID  Revatio as above May benefit vasopressin , in am if remain on levo would add  RENAL A:   ESRD - on HD, does not make urine AG metabolic acidosis - resolved   Mild hyperkalemia  P:   Cvvhd, successful, BP tolerating overall well Chem in am  kvo   GASTROINTESTINAL A:   Frequent paracentesis - at the [place], records indicate ESRD associated ascites ?? Hypoalbuminemia  ? Liver disease > could possibly explain baseline hypotension. No evidence.  Ascites due to  RHF P:   Renal diet as tolerated Aspiration precautions, SLP following  Few hallmarks of cirrhosis on lab eval.    HEMATOLOGIC A:   Anemia of chronic disease  Leukocytosis - demargination vs sepsis. Also steroids.  On 24th neutrophils P:  Trend cbc and GET diff repeat May need to send flow cytometry  Elias-Fela Solis heparin Transfuse per ICU protocol   INFECTIOUS A:   R/o septic shock - currently potential sources include LE cellulitis; but also consider transient bacteremia (  possibly infected AVF? ) No clear source.  Doubt infectious etiology of hypotension.  Leukocytosis - Afebrile, on stress dose steroids P:   Trend CBC and diff Repeat pct assessment trend If remains on pressors would add vanc empiric  ENDOCRINE A:   Hypothyroidism  At Risk Hyperglycemia in setting of stress dose steroids  Adrenal Insufficiency  P:   Cont synthroid  Stress dose steroids, consider wean / d/c once off levophed, keeop No role florinef with ESRD  NEUROLOGIC A:   H/o peripheral neuropathy  P:  Hold sedating meds   FAMILY  - Updates:  Patient updated   - Inter-disciplinary family meet or Palliative Care meeting due by: 3/2  Ccm time 30 min   Lavon Paganini. Titus Mould, MD, [place] Pgr: [facility]

## 2015-04-10 LAB — RENAL FUNCTION PANEL
ALBUMIN: 2.9 g/dL — AB (ref 3.5–5.0)
ANION GAP: 12 (ref 5–15)
Albumin: 2.8 g/dL — ABNORMAL LOW (ref 3.5–5.0)
Anion gap: 13 (ref 5–15)
BUN: 13 mg/dL (ref 6–20)
BUN: 10 mg/dL (ref 6–20)
CALCIUM: 8.5 mg/dL — AB (ref 8.9–10.3)
CHLORIDE: 101 mmol/L (ref 101–111)
CHLORIDE: 101 mmol/L (ref 101–111)
CO2: 24 mmol/L (ref 22–32)
CO2: 24 mmol/L (ref 22–32)
Calcium: 8.6 mg/dL — ABNORMAL LOW (ref 8.9–10.3)
Creatinine, Ser: 2.3 mg/dL — ABNORMAL HIGH (ref 0.61–1.24)
Creatinine, Ser: 1.91 mg/dL — ABNORMAL HIGH (ref 0.61–1.24)
GFR calc Af Amer: 30 mL/min — ABNORMAL LOW (ref >60)
GFR calc non Af Amer: 26 mL/min — ABNORMAL LOW (ref >60)
GFR, EST AFRICAN AMERICAN: 38 mL/min — AB (ref >60)
GFR, EST NON AFRICAN AMERICAN: 33 mL/min — AB (ref >60)
GLUCOSE: 117 mg/dL — AB (ref 65–99)
Glucose, Bld: 129 mg/dL — ABNORMAL HIGH (ref 65–99)
POTASSIUM: 4.6 mmol/L (ref 3.5–5.1)
Phosphorus: 2.8 mg/dL (ref 2.5–4.6)
Phosphorus: 2.5 mg/dL (ref 2.5–4.6)
Potassium: 4.2 mmol/L (ref 3.5–5.1)
SODIUM: 138 mmol/L (ref 135–145)
Sodium: 137 mmol/L (ref 135–145)

## 2015-04-10 LAB — CBC WITH DIFFERENTIAL/PLATELET
BASOS ABS: 0 10*3/uL (ref 0.0–0.1)
Basophils Relative: 0 %
EOS PCT: 0 %
Eosinophils Absolute: 0 10*3/uL (ref 0.0–0.7)
HEMATOCRIT: 41.4 % (ref 39.0–52.0)
HEMOGLOBIN: 12.5 g/dL — AB (ref 13.0–17.0)
LYMPHS ABS: 3.4 10*3/uL (ref 0.7–4.0)
LYMPHS PCT: 6 %
MCH: 30.1 pg (ref 26.0–34.0)
MCHC: 30.2 g/dL (ref 30.0–36.0)
MCV: 99.8 fL (ref 78.0–100.0)
MONOS PCT: 1 %
Monocytes Absolute: 0.6 10*3/uL (ref 0.1–1.0)
Neutro Abs: 52.8 10*3/uL — ABNORMAL HIGH (ref 1.7–7.7)
Neutrophils Relative %: 93 %
Platelets: 262 10*3/uL (ref 150–400)
RBC: 4.15 MIL/uL — AB (ref 4.22–5.81)
RDW: 20.3 % — ABNORMAL HIGH (ref 11.5–15.5)
WBC: 56.8 10*3/uL — AB (ref 4.0–10.5)

## 2015-04-10 LAB — PROCALCITONIN: PROCALCITONIN: 0.69 ng/mL

## 2015-04-10 LAB — MAGNESIUM: Magnesium: 2.5 mg/dL — ABNORMAL HIGH (ref 1.7–2.4)

## 2015-04-10 MED ORDER — HYDROCORTISONE NA SUCCINATE PF 100 MG IJ SOLR
25.0000 mg | Freq: Every day | INTRAMUSCULAR | Status: AC
  Administered 2015-04-13: 25 mg via INTRAVENOUS
  Filled 2015-04-10: qty 2

## 2015-04-10 MED ORDER — NEPRO/CARBSTEADY PO LIQD
237.0000 mL | Freq: Two times a day (BID) | ORAL | Status: DC
  Administered 2015-04-11 (×2): 237 mL via ORAL
  Filled 2015-04-10 (×4): qty 237

## 2015-04-10 MED ORDER — HYDROCORTISONE NA SUCCINATE PF 100 MG IJ SOLR
50.0000 mg | Freq: Two times a day (BID) | INTRAMUSCULAR | Status: AC
  Administered 2015-04-10 – 2015-04-11 (×3): 50 mg via INTRAVENOUS
  Filled 2015-04-10 (×3): qty 2

## 2015-04-10 MED ORDER — HYDROCORTISONE NA SUCCINATE PF 100 MG IJ SOLR
50.0000 mg | Freq: Every day | INTRAMUSCULAR | Status: AC
  Administered 2015-04-12: 50 mg via INTRAVENOUS
  Filled 2015-04-10: qty 2

## 2015-04-10 NOTE — Progress Notes (Signed)
Patient ID: Erik Ochoa, male   DOB: [birth year], 74 y.o.   MRN: [id number]   SUBJECTIVE:   RHC with normal PCWP but elevated R-sided filling pressures suggestive of RV failure. 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  Revatio 20 mg TID started.   Stable. norepinephrine remains at 1.6.  SBP 80s-110. Denies lightheadedness or dizziness. CVVH ongoing.  Weight down about 40 lbs so far, CVP 8-9 this morning.   Echo: LV EF 60-65%, D-shaped interventricular septum suggestive of RV pressure/volume overload, severely dilated RV with severe RV systolic dysfunction, PASP 59 mmHg. Moderate AS, mild MR.   RHC Procedural Findings: Hemodynamics (mmHg) RA mean 14 RV 48/13 PA 53/17, mean 30 PCWP mean 9 Oxygen saturations: PA 53% AO 98% Cardiac Output (Fick) 3.84  Cardiac Index (Fick) 1.87 PVR 5.5 WU Cardiac Output (Thermodilution) 4.27 Cardiac Index (Thermodilution) 2.08  PVR 4.91 WU  Scheduled Meds: . antiseptic oral rinse  7 mL Mouth Rinse BID  . doxercalciferol  1 mcg Intravenous Q T,Th,Sa-HD  . heparin  5,000 Units Subcutaneous 3 times per day  . hydrocortisone sod succinate (SOLU-CORTEF) inj  50 mg Intravenous Q6H  . levothyroxine  50 mcg Oral QAC breakfast  . midodrine  20 mg Oral TID WC  . piperacillin-tazobactam  3.375 g Intravenous 4 times per day  . sildenafil  20 mg Oral TID   Continuous Infusions: . norepinephrine (LEVOPHED) Adult infusion Stopped (04/10/15 0756)  . dialysis replacement fluid (prismasate) 400 mL/hr at 04/10/15 0615  . dialysis replacement fluid (prismasate) 200 mL/hr at 04/09/15 1630  . dialysate (PRISMASATE) 1,700 mL/hr at 04/10/15 0737   PRN Meds:.sodium chloride, acetaminophen, alteplase, alteplase, heparin, heparin, ondansetron (ZOFRAN) IV, pentafluoroprop-tetrafluoroeth, sodium chloride    Filed Vitals:   04/10/15 0431 04/10/15 0500 04/10/15 0600 04/10/15 0700  BP:  82/51 96/69 86/61   Pulse:  68 70 64  Temp:       TempSrc:      Resp:  16 16 15   Height:      Weight: 141 lb 15.6 oz (64.4 kg)     SpO2:  97% 97% 97%    Intake/Output Summary (Last 24 hours) at 04/10/15 0758 Last data filed at 04/10/15 0700  Gross per 24 hour  Intake 1389.9 ml  Output   5246 ml  Net -3856.1 ml    LABS: Basic Metabolic Panel:  Recent Labs  04/09/15 0350 04/09/15 1843 04/10/15 0453  NA 138 138 138  K 4.5 4.4 4.6  CL 105 102 101  CO2 26 23 24   GLUCOSE 110* 154* 117*  BUN 11 11 13   CREATININE 1.90* 1.89* 2.30*  CALCIUM 8.3* 8.5* 8.6*  MG 2.5*  --  2.5*  PHOS 2.5 2.6 2.8   Liver Function Tests:  Recent Labs  04/09/15 1843 04/10/15 0453  ALBUMIN 3.0* 2.9*   No results for input(s): LIPASE, AMYLASE in the last 72 hours. CBC:  Recent Labs  04/09/15 0350 04/10/15 0453  WBC 48.3* 56.8*  NEUTROABS  --  52.8*  HGB 11.8* 12.5*  HCT 40.1 41.4  MCV 101.0* 99.8  PLT 224 262   Cardiac Enzymes: No results for input(s): CKTOTAL, CKMB, CKMBINDEX, TROPONINI in the last 72 hours. BNP: Invalid input(s): POCBNP D-Dimer: No results for input(s): DDIMER in the last 72 hours. Hemoglobin A1C: No results for input(s): HGBA1C in the last 72 hours. Fasting Lipid Panel: No results for input(s): CHOL, HDL, LDLCALC, TRIG, CHOLHDL, LDLDIRECT in the last 72 hours.  Thyroid Function Tests: No results for input(s): TSH, T4TOTAL, T3FREE, THYROIDAB in the last 72 hours.  Invalid input(s): FREET3 Anemia Panel: No results for input(s): VITAMINB12, FOLATE, FERRITIN, TIBC, IRON, RETICCTPCT in the last 72 hours.  RADIOLOGY: Dg Chest 2 View  04/03/2015  CLINICAL DATA:  Hypotension, hypoxia EXAM: CHEST  2 VIEW COMPARISON:  05/19/2012; 05/18/2012 FINDINGS: Grossly unchanged enlarged cardiac silhouette and mediastinal contours given reduced lung volumes. The pulmonary vasculature is indistinct with cephalization of flow. Interval development of small to moderate-sized bilateral effusions with associated worsening  bilateral mid and lower lung heterogeneous opacities. No pneumothorax. No acute osseus abnormalities. IMPRESSION: Findings worrisome for pulmonary edema with small to moderate sized bilateral effusions and associated bibasilar opacities, atelectasis versus infiltrate. Electronically Signed   By: Sandi Mariscal M.D.   On: 04/03/2015 11:37   Dg Chest Port 1 View  04/07/2015  CLINICAL DATA:  Right central line placement EXAM: PORTABLE CHEST 1 VIEW COMPARISON:  04/07/2015 FINDINGS: Right jugular central venous catheter with the tip projecting over the SVC. Small bilateral pleural effusions. Mild bilateral interstitial thickening. There is no pneumothorax. The heart and mediastinum are stable. The osseous structures are unremarkable. IMPRESSION: 1. Right jugular central venous catheter with the tip projecting over the SVC. 2. Mild CHF. Electronically Signed   By: Kathreen Devoid   On: 04/07/2015 14:54   Dg Chest Port 1 View  04/07/2015  CLINICAL DATA:  Assess right central line, after being accidentally tugged. Initial encounter. EXAM: PORTABLE CHEST 1 VIEW COMPARISON:  Chest radiograph performed 04/03/2015 FINDINGS: The patient's right IJ dual-lumen catheter has been retracted, now seen ending overlying the proximal SVC. Small bilateral pleural effusions are noted, larger on the right. Bibasilar airspace opacities may reflect mild interstitial edema or pneumonia. No pneumothorax is seen. The cardiomediastinal silhouette is borderline normal in size. No acute osseous abnormalities are identified. IMPRESSION: 1. Right IJ line has been retracted, now seen ending overlying the proximal SVC. 2. Small bilateral pleural effusions, larger on the right. Bibasilar airspace opacities may reflect mild interstitial edema or pneumonia. Electronically Signed   By: Garald Balding M.D.   On: 04/07/2015 01:46   Dg Chest Port 1 View  04/03/2015  CLINICAL DATA:  Encounter for central line placement. EXAM: PORTABLE CHEST - 1 VIEW  COMPARISON:  Two-view chest x-ray from the same day. FINDINGS: The heart is enlarged. Atherosclerotic calcifications are present at the aortic arch. Bilateral pleural effusions are again noted. Any right IJ line is in place. There is no pneumothorax. Lung volumes have slightly improved. IMPRESSION: 1. New right IJ line without radiographic evidence for complication. 2. Slightly improved lung volumes with persistent bilateral pleural effusions and mild diffuse edema. Electronically Signed   By: San Morelle M.D.   On: 04/03/2015 16:40    PHYSICAL EXAM General: NAD Neck: JVP 8 cm, no thyromegaly or thyroid nodule.  Lungs: Slight decreased breath sounds at bases.  CV: Nondisplaced PMI.  Heart irregular S1/S2, no S3/S4, 2/6 SEM RUSB.  No peripheral edema.   Abdomen: Soft, NT, ND, no HSM. No bruits or masses. +BS.  Neurologic: Alert and oriented x 3.  Psych: Normal affect. Extremities: No clubbing or cyanosis.   TELEMETRY: Reviewed telemetry pt in atrial fibrillation in 60-70s  ASSESSMENT AND PLAN: 75 yo with history of chronic atrial fibrillation, ESRD and diastolic CHF/RV failure presented with hypotension and volume overloaded.  1. Atrial fibrillation: Chronic. Has not been on anticoagulation, per him, because he "bruises too much."  -  We discussed restarting coumadin.  He says that he will think about it.  Can readdress. 2. Acute on chronic diastolic CHF with prominent RV failure: Normal LV EF but severely dilated RV with severely decreased RV systolic function. He was admitted with marked volume overload. He is getting CVVH, weight down about 40 lbs. CVP 8-9 today. Lactate normal yesterday.  - Cut back UF to 100 cc/hr and stop norepinephrine completely.  Tolerate SBP > 80.  - Continue UF at 250 cc/hr, would tolerate SBP 80s-90s as suspect this is where he is chronically.  - Continue midodrine.  - RHC showed primarily RV failure. Revatio started for [facility].  3. Pulmonary hypertension:  High PA pressure on echo and severe RV systolic dysfunction. Possible pulmonary venous hypertension from LV diastolic dysfunction, but he has quite severe RV dysfunction so concern for intrinsic pulmonary vascular disease.  - As above, RHC yesterday with normal PCWP but elevated filling pressures.  - Needs eventual V/Q scan for chronic PE assessment and PFTs => would do V/Q prior to discharge, probably after he comes off CVVH.  - Rheumatologic serologies.  - Continue revatio 20 mg TID.  4. ID: Initial concern for septic shock, but no infectious source found so far. I wonder if most of the problem is not RV failure with resulting hypotension/volume overload.  He continues on Zosyn.  Loralie Champagne 04/10/2015 7:58 AM

## 2015-04-10 NOTE — Progress Notes (Signed)
[facility] Progress Note   Subjective:  3.8L negative yesterday NE stopped this AM CVP 9 Incredibly faint bruit only at proximal portion of AVF   Filed Vitals:   04/10/15 0600 04/10/15 0700 04/10/15 0738 04/10/15 0800  BP: 96/69 86/61 111/68 84/60  Pulse: 70 64 75 66  Temp:    97.9 F (36.6 C)  TempSrc:    Oral  Resp: 16 15 16 19   Height:      Weight:      SpO2: 97% 97% 97% 97%    Inpatient medications: . antiseptic oral rinse  7 mL Mouth Rinse BID  . doxercalciferol  1 mcg Intravenous Q T,Th,Sa-HD  . heparin  5,000 Units Subcutaneous 3 times per day  . hydrocortisone sod succinate (SOLU-CORTEF) inj  50 mg Intravenous Q6H  . levothyroxine  50 mcg Oral QAC breakfast  . midodrine  20 mg Oral TID WC  . piperacillin-tazobactam  3.375 g Intravenous 4 times per day  . sildenafil  20 mg Oral TID   . norepinephrine (LEVOPHED) Adult infusion Stopped (04/10/15 0756)  . dialysis replacement fluid (prismasate) 400 mL/hr at 04/10/15 0615  . dialysis replacement fluid (prismasate) 200 mL/hr at 04/09/15 1630  . dialysate (PRISMASATE) 1,700 mL/hr at 04/10/15 0737   sodium chloride, acetaminophen, alteplase, alteplase, heparin, heparin, ondansetron (ZOFRAN) IV, pentafluoroprop-tetrafluoroeth, sodium chloride  Exam: General:NAD, chronically ill appearing Neck: Supple. JVD not elevated. R IJ TDC Lungs: Dim BS Breathing is unlabored. Heart: RRR 2/6 murmur Abdomen: Soft, 2+ ascites as usual Ext: 1+ depend hip/ thigh edema bilat Neuro: Alert and oriented X 3. Moves all ext Dialysis Access: very faint bruit in juxta region  Current CRRT Prescription: Start Date: 2/24 Catheter: R IJ BFR: 250 Pre Blood Pump: 400 4K DFR: 1700 4K Replacement Rate: 200 4K Goal UF: 282mL/hr neg Anticoagulation: none in circuit Clotting: none   Dialysis Orders: [facility] TTS 4.15 180 450/800 EDW 76 2 K 2.25 Ca no profile left lower AVF hectorol 1 heparin 8400 venofer 50 2/16 - last hgb  11.5   Assessment/Plan: 1. Sepsis with persistent leukocytosis: stable on Zosyn, neg cultures; on stress steroids 2. ESRD - With a poorly functioning AVF S/p access angioplasty the morning of admission. On CRRT for now via vascath placed 2/24.  I doubt AVF is going to be usable moving forward 3. Chronic hypotension - back on 20 tid of midodrine as at home.  Stable, tolerating SBP >80.   4. Anemia - Hb stable - hold venofer for now.  5. Metabolic bone disease - Last iPTH 436 - on hectorol 1 - -probably needs increased - no adjustment made after Jan labs.- wait on change for now; phoslo 3 ac tid 6. Nutrition - poor alb 2.7  7. Chronic ascites - requires periodic paracentesis 8. R sided CHF with pulmonary HTN: RHC 3/1 with nl PCWP. Now on Sildenafil for pHTN; UF rate dropped today  Plan - Will cont CRRT for more UF today.  Labs stable.    Rexene Agent   04/10/2015, 8:23 AM    Recent Labs Lab 04/09/15 0350 04/09/15 1843 04/10/15 0453  NA 138 138 138  K 4.5 4.4 4.6  CL 105 102 101  CO2 26 23 24   GLUCOSE 110* 154* 117*  BUN 11 11 13   CREATININE 1.90* 1.89* 2.30*  CALCIUM 8.3* 8.5* 8.6*  PHOS 2.5 2.6 2.8    Recent Labs Lab 04/03/15 1059 04/03/15 1615  04/09/15 0350 04/09/15 1843 04/10/15 0453  AST  17 15  --   --   --   --   ALT 13* 10*  --   --   --   --   ALKPHOS 66 60  --   --   --   --   BILITOT 0.6 0.7  --   --   --   --   PROT 5.3* 4.4*  --   --   --   --   ALBUMIN 2.7* 2.4*  < > 2.9* 3.0* 2.9*  < > = values in this interval not displayed.  Recent Labs Lab 04/03/15 1059  04/08/15 0425 04/09/15 0350 04/10/15 0453  WBC 24.6*  < > 51.2* 48.3* 56.8*  NEUTROABS 21.8*  --   --   --  52.8*  HGB 11.3*  < > 11.6* 11.8* 12.5*  HCT 35.1*  < > 38.7* 40.1 41.4  MCV 96.2  < > 100.3* 101.0* 99.8  PLT 191  < > 234 224 262  < > = values in this interval not displayed.

## 2015-04-10 NOTE — Progress Notes (Signed)
MD Aundra Dubin advised to decrease CRRT fluid removal rate to 100 mL/hr.   Spoke with MD Joelyn Oms, MD confirmed to remove 100 mL/hr.  MD Joelyn Oms advised to keep Levophed turned off and that if blood pressure didn't tolerate fluid removal rate to lower fluid removal rate and to run even if needed.

## 2015-04-10 NOTE — Progress Notes (Addendum)
PULMONARY / CRITICAL CARE MEDICINE   Name: Fritzgerald Decandia MRN: [id number] DOB: 13-Jan-1941    ADMISSION DATE:  04/03/2015 CONSULTATION DATE:  2/24  REFERRING MD:  Sabra Heck   CHIEF COMPLAINT:  Hypotension   BRIEF Hx:   75 year old male with a PMH of HTN, CHF, AF (not on AC), PH, Q2 week paracentesis at the [place] and ESRD who presented 2/24 to an outpatient vascular center for planned left balloon angioplasty of L AVF.  Post procedure had ongoing hypotension and was referred to ED for evaluation.  Admitted with working dx of sepsis (doubt infection).  To ICU for CVVHD in the setting of shock thought related to pulmonary hypertension  Was on levophed.  RHC with normal PCWP but elevated R-sided filling pressures suggestive of RV failure. 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   SUBJECTIVE:  Patient is awake, alert , oriented  No distress noted, watching TV. Getting CRRT. Says feels much better.  VITAL SIGNS: BP 85/48 mmHg  Pulse 70  Temp(Src) 97.9 F (36.6 C) (Oral)  Resp 16  Ht 6\' 4"  (1.93 m)  Wt 141 lb 15.6 oz (64.4 kg)  BMI 17.29 kg/m2  SpO2 98%  HEMODYNAMICS: CVP:  [5 mmHg-9 mmHg] 9 mmHg  VENTILATOR SETTINGS:    INTAKE / OUTPUT: I/O last 3 completed shifts: In: 1689.2 [P.O.:990; I.V.:399.2; IV Piggyback:300] Out: [id number] [Other:[id number]]  PHYSICAL EXAMINATION: General:  Chronically ill, cachectic  appearing white male, resting in bed. No distress.  Neuro:  Awake, oriented. No focal def  HEENT:  Poor dentition, Atraumatic, normocephalic,white sclera Cardiovascular: Irregular, no MRG Lungs:  Clear, diminished towards  bases, no accessory muscle use  Abdomen soft non tender, + bowel sounds Musculoskeletal:   Moderate tone, BLE edema Skin:  Mild abrasion on the left elbow, warm/dry, ulceration on R shoulder  LABS:  BMET  Recent Labs Lab 04/09/15 0350 04/09/15 1843 04/10/15 0453  NA 138 138 138  K 4.5 4.4 4.6  CL 105 102 101  CO2 26  23 24   BUN 11 11 13   CREATININE 1.90* 1.89* 2.30*  GLUCOSE 110* 154* 117*    Electrolytes  Recent Labs Lab 04/08/15 0425  04/09/15 0350 04/09/15 1843 04/10/15 0453  CALCIUM 8.4*  < > 8.3* 8.5* 8.6*  MG 2.6*  --  2.5*  --  2.5*  PHOS 3.3  < > 2.5 2.6 2.8  < > = values in this interval not displayed.  CBC  Recent Labs Lab 04/08/15 0425 04/09/15 0350 04/10/15 0453  WBC 51.2* 48.3* 56.8*  HGB 11.6* 11.8* 12.5*  HCT 38.7* 40.1 41.4  PLT 234 224 262    Coag's  Recent Labs Lab 04/03/15 1615  04/07/15 0422 04/08/15 0425 04/09/15 0350  APTT 32  < > 33 37 33  INR 1.36  --   --   --   --   < > = values in this interval not displayed.  Sepsis Markers  Recent Labs Lab 04/03/15 1615 04/03/15 1658 04/09/15 0350 04/09/15 0635 04/10/15 0453  LATICACIDVEN  --  1.57  --  1.1  --   PROCALCITON 0.59  --  0.53  --  0.69    ABG No results for input(s): PHART, PCO2ART, PO2ART in the last 168 hours.  Liver Enzymes  Recent Labs Lab 04/03/15 1615  04/09/15 0350 04/09/15 1843 04/10/15 0453  AST 15  --   --   --   --   ALT 10*  --   --   --   --  ALKPHOS 60  --   --   --   --   BILITOT 0.7  --   --   --   --   ALBUMIN 2.4*  < > 2.9* 3.0* 2.9*  < > = values in this interval not displayed.  Cardiac Enzymes  Recent Labs Lab 04/03/15 1615  TROPONINI 0.09*    Glucose  Recent Labs Lab 04/03/15 1819 04/03/15 2057  GLUCAP 75 95    Imaging No results found.   STUDIES:  ECHO 02/27 - LV EF 60-65%, D-shaped interventricular septum suggestive of RV pressure/volume overload, severely dilated RV with severe RV systolic dysfunction, PASP 59 mmHg. Moderate AS, mild MR.  RUE [place] 2/27 >> neg for DVT, no abscess RHC 3/1 >> normal PCWP (9), elevated right sided filling pressures suggestive of R heart failure, moderated PAH (53/17)  CULTURES: BCX2 2/24 >>   ANTIBIOTICS: Vanc 2/24 >> 2/27 Zosyn 2/24 >>  SIGNIFICANT EVENTS: 2/24  Admitted with hypotension,  concern for septic shock from Cellulitis.  CRRT initiated.  2/27  Paracentesis completed; 4.5L off 2/28  Trialysis cath partially pulled out by patient; replaced, CRRT continues 3/01  RHC for persistent hypotension, thought related to [facility]   LINES/TUBES: Dialysis Catheter R IJ 2/24 >>  DISCUSSION: 75 year old male w/ ESRD. Takes midodrine for hypotension on dialysis days (BP in 90s). Last HD was Tuesday 2/12. Went to out-pt vascular procedure facility on 2/24 for elective balloon angioplasty of L AVF. Post-procedure developed persistent hypotension w/ room air sats 88% and BP in 70s. No clear source of infection.  Hypotension thought related to pulmonary hypertension.  Marland Kitchen RHC with normal PCWP but elevated R-sided filling pressures suggestive of RV failure. 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 Revatio 20 mg TID started by Card     ASSESSMENT / PLAN:  PULMONARY A: Pleural effusions / pulmonary edema  Pulmonary Hypertension - moderate PAH on RHC P:   Pulmonary hygiene O2 as needed CRRT for even balance as tolerated   Revatio started by Card  CARDIOVASCULAR A:  Septic shock - source unclear, low dose levophed continues.  ? Baseline BP readings Chronic Afib - rate controlled. Not on anticoagulation per patient Hypotensive - likely baseline hypotension.  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  P:  Off pressors Records from [place] no anticoagulation on med list. No cardiology notes. Holding for now.  Continue midodrine TID  Revatio as above  RENAL A:   ESRD - on HD, does not make urine AG metabolic acidosis - resolved  Hyperkalemia resolved    P:   Renal following, continue CRRT Neg balance successful Trend BMP   GASTROINTESTINAL A:   Frequent paracentesis - at the [place], records indicate ESRD associated ascites ?? Hypoalbuminemia  ? Liver disease > could  possibly explain baseline hypotension. No evidence.  Ascites due to RHF P:   Renal diet as tolerated Aspiration precautions, SLP following  Trend AST,ALT in am   HEMATOLOGIC A:   Anemia of chronic disease  Leukocytosis -unclear hemoconcentrated last 24 hr P:   continue to Trend cbc   Continue  heparin  Transfuse per ICU protocol  consider flow cytometry, heme consult vacoules noted, assess rt base effusion, neutrophils noted may not accept flow?  INFECTIOUS A:   R/o septic shock - currently potential sources include LE cellulitis; but also consider transient bacteremia (  possibly infected AVF? ) No clear source.  Doubt infectious etiology of hypotension.  Leukocytosis - Afebrile, on stress dose steroids P:   Trend CBC Follow temp wbc markedly elevated,  Consider dc all ABX after 8 day course wlll assess ct chest to assess effusion character on rt, may consider diagnostic tap Has made some clinical progress  ENDOCRINE A:   Hypothyroidism  At Risk Hyperglycemia in setting of stress dose steroids  Adrenal Insufficiency  P:   Cont synthroid  Continue Stress dose steroids, consider wean / d/c once off levophed  NEUROLOGIC A:   H/o peripheral neuropathy  P:  Hold sedating meds  Bincy Varughese,AG-ACNP Pulmonary & Critical Care   STAFF NOTE: I, Merrie Roof, MD FACP have personally reviewed patient's available data, including medical history, events of note, physical examination and test results as part of my evaluation. I have discussed with resident/NP and other care providers such as pharmacist, RN and RRT. In addition, I personally evaluated patient and elicited key findings of: no distress, reduced BS rt base, he has made clinical progress not off pressors, unlikely this is empyema, but need to r/o , assess CT chest when able with cvvhd and consider abdo /pelvis with this CT, ABX planned 8 days, no fevers, WBC up furtehr from neg 4 liters last 24 hr, but still  unclear etiology, can we send flow? On Neur?, assess heme consult, mitodrin to continued  UPDATE: further d./w PT he now can admit to a hematologic mutation with baseline WBC in 30-40 range, dc CT, allow ABX to dc, WBC now at 50s is volume related also Will sign off  Lavon Paganini. Titus Mould, MD, [facility] Pgr: [facility] 04/10/2015 12:35 PM

## 2015-04-11 DIAGNOSIS — E43 Unspecified severe protein-calorie malnutrition: Secondary | ICD-10-CM | POA: Diagnosis present

## 2015-04-11 LAB — RENAL FUNCTION PANEL
Albumin: 2.6 g/dL — ABNORMAL LOW (ref 3.5–5.0)
Anion gap: 14 (ref 5–15)
BUN: 10 mg/dL (ref 6–20)
CO2: 22 mmol/L (ref 22–32)
CREATININE: 1.83 mg/dL — AB (ref 0.61–1.24)
Calcium: 7.9 mg/dL — ABNORMAL LOW (ref 8.9–10.3)
Chloride: 105 mmol/L (ref 101–111)
GFR calc non Af Amer: 35 mL/min — ABNORMAL LOW (ref >60)
GFR, EST AFRICAN AMERICAN: 40 mL/min — AB (ref >60)
GLUCOSE: 84 mg/dL (ref 65–99)
Phosphorus: 2.4 mg/dL — ABNORMAL LOW (ref 2.5–4.6)
Potassium: 4.1 mmol/L (ref 3.5–5.1)
SODIUM: 141 mmol/L (ref 135–145)

## 2015-04-11 LAB — MAGNESIUM: Magnesium: 2.1 mg/dL (ref 1.7–2.4)

## 2015-04-11 LAB — PROCALCITONIN: PROCALCITONIN: 0.58 ng/mL

## 2015-04-11 MED ORDER — SODIUM CHLORIDE 0.9 % IV BOLUS (SEPSIS)
250.0000 mL | Freq: Once | INTRAVENOUS | Status: AC
  Administered 2015-04-11: 250 mL via INTRAVENOUS

## 2015-04-11 MED ORDER — NEPRO/CARBSTEADY PO LIQD
237.0000 mL | Freq: Three times a day (TID) | ORAL | Status: DC
  Administered 2015-04-11 – 2015-04-14 (×8): 237 mL via ORAL
  Administered 2015-04-14: 340 mL via ORAL
  Administered 2015-04-14 – 2015-04-15 (×4): 237 mL via ORAL
  Filled 2015-04-11 (×18): qty 237

## 2015-04-11 NOTE — Progress Notes (Signed)
[facility] Progress Note   Subjective:  NE still off >1L neg  CRRT clotted this AM CVP 3-5 No c/o   Filed Vitals:   04/11/15 0400 04/11/15 0500 04/11/15 0600 04/11/15 0700  BP: 78/59 77/57 79/53  80/53  Pulse: 71 67 33 39  Temp: 98 F (36.7 C)     TempSrc: Oral     Resp: 16 16 15 23   Height:      Weight:  64.2 kg (141 lb 8.6 oz)    SpO2: 100% 100% 97% 100%    Inpatient medications: . antiseptic oral rinse  7 mL Mouth Rinse BID  . doxercalciferol  1 mcg Intravenous Q T,Th,Sa-HD  . feeding supplement (NEPRO CARB STEADY)  237 mL Oral BID BM  . heparin  5,000 Units Subcutaneous 3 times per day  . hydrocortisone sod succinate (SOLU-CORTEF) inj  50 mg Intravenous Q12H   Followed by  . [START ON 04/12/2015] hydrocortisone sod succinate (SOLU-CORTEF) inj  50 mg Intravenous Daily   Followed by  . [START ON 04/13/2015] hydrocortisone sod succinate (SOLU-CORTEF) inj  25 mg Intravenous Daily  . levothyroxine  50 mcg Oral QAC breakfast  . midodrine  20 mg Oral TID WC  . piperacillin-tazobactam  3.375 g Intravenous 4 times per day  . sildenafil  20 mg Oral TID   . norepinephrine (LEVOPHED) Adult infusion Stopped (04/10/15 0756)  . dialysis replacement fluid (prismasate) 400 mL/hr at 04/11/15 0226  . dialysis replacement fluid (prismasate) 200 mL/hr at 04/10/15 1845  . dialysate (PRISMASATE) 1,700 mL/hr at 04/11/15 0359   sodium chloride, acetaminophen, alteplase, alteplase, heparin, heparin, ondansetron (ZOFRAN) IV, pentafluoroprop-tetrafluoroeth, sodium chloride  Exam: General:NAD, chronically ill appearing Neck: Supple. JVD not elevated. R IJ TDC Lungs: Dim BS Breathing is unlabored. Heart: RRR 2/6 murmur Abdomen: Soft, 2+ ascites as usual Ext: 1+ depend hip/ thigh edema bilat Neuro: Alert and oriented X 3. Moves all ext Dialysis Access: very faint bruit in juxta region  Current CRRT Prescription: Start Date: 2/24 Catheter: R IJ BFR: 250 Pre Blood Pump:  400 4K DFR: 1700 4K Replacement Rate: 200 4K Goal UF: 241mL/hr neg Anticoagulation: none in circuit Clotting: none   Dialysis Orders: [facility] [facility] 4.15 180 450/800 EDW 76 2 K 2.25 Ca no profile left lower AVF hectorol 1 heparin 8400 venofer 50 2/16 - last hgb 11.5   Assessment/Plan: 1. Sepsis with persistent leukocytosis: stable on Zosyn, neg cultures; on stress steroids 2. ESRD - With a poorly functioning AVF S/p access angioplasty the morning of admission. On CRRT for now via [place] placed 2/24.  I doubt AVF is going to be usable moving forward 3. Chronic hypotension - back on 20 tid of midodrine as at home.  Stable, tolerating SBP >80.   4. Anemia - Hb stable - hold venofer for now.  5. Metabolic bone disease - Last iPTH 436 - on hectorol 1 - -probably needs increased - no adjustment made after Jan labs.- wait on change for now; phoslo 3 ac tid 6. Nutrition - poor alb 2.7  7. Chronic ascites - requires periodic paracentesis 8. R sided CHF with pulmonary HTN: RHC 3/1 with nl PCWP. Now on Sildenafil for pHTN; UF rate dropped today  Plan - No CRRT restart unless AHF feels strongly.  Tentaive plan for [facility] Monday.  Has temp cath, will attempt cannulation of AVF first.     Rexene Agent   04/11/2015, 7:11 AM    Recent Labs Lab 04/10/15 0453 04/10/15  1600 04/11/15 0514  NA 138 137 141  K 4.6 4.2 4.1  CL 101 101 105  CO2 24 24 22   GLUCOSE 117* 129* 84  BUN 13 10 10   CREATININE 2.30* 1.91* 1.83*  CALCIUM 8.6* 8.5* 7.9*  PHOS 2.8 2.5 2.4*    Recent Labs Lab 04/10/15 0453 04/10/15 1600 04/11/15 0514  ALBUMIN 2.9* 2.8* 2.6*    Recent Labs Lab 04/08/15 0425 04/09/15 0350 04/10/15 0453  WBC 51.2* 48.3* 56.8*  NEUTROABS  --   --  52.8*  HGB 11.6* 11.8* 12.5*  HCT 38.7* 40.1 41.4  MCV 100.3* 101.0* 99.8  PLT 234 224 262

## 2015-04-11 NOTE — Progress Notes (Signed)
Initial Nutrition Assessment  DOCUMENTATION CODES:   Severe malnutrition in context of chronic illness, Underweight  INTERVENTION:  Continue Nepro Shake po TID, each supplement provides 425 kcal and 19 grams protein  Encourage adequate PO intake.  NUTRITION DIAGNOSIS:   Malnutrition related to chronic illness as evidenced by severe depletion of body fat, severe depletion of muscle mass.  GOAL:   Patient will meet greater than or equal to 90% of their needs  MONITOR:   PO intake, Supplement acceptance, Weight trends, Labs, I & O's, Skin  REASON FOR ASSESSMENT:   Malnutrition Screening Tool    ASSESSMENT:   75 year old male with a PMH of HTN, CHF, AF (not on AC), PH, Q2 week paracentesis at the [place] and ESRD who presented 2/24 to an outpatient vascular center for planned left balloon angioplasty of L AVF. Post procedure had ongoing hypotension and was referred to ED for evaluation. Admitted with working dx of sepsis (doubt infection). To ICU for CVVHD in the setting of shock thought related to pulmonary hypertension Was on levophed. RHC with normal PCWP but elevated R-sided filling pressures suggestive of RV failure. 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 Plans for iHD Monday.   Pt reports having a decreased appetite which has been ongoing since admission. He reports having a good appetite PTA at home with consumption of at least 2-3 meals a day with a Nepro Shake once daily. Meal completion has been 25-75% with 75% at breakfast this AM. Usual body weight reported to be ~180 lbs. Weight loss likely associated with fluids as pt underwent a paracentesis 2/27 with removal of 4.5 L. Pt currently has Nepro Shake ordered and has been consuming them. RD to modify orders to provide Nepro TID to aid in caloric and protein needs.   Nutrition-Focused physical exam completed. Findings are severe fat depletion, severe muscle depletion, and no edema.   Labs  and medications reviewed.   Diet Order:  Diet renal with fluid restriction Fluid restriction:: 1200 mL Fluid; Room service appropriate?: Yes; Fluid consistency:: Thin  Skin:  Wound (see comment) (Stage II ulcer on R shoulder)  Last BM:  3/4  Height:   Ht Readings from Last 1 Encounters:  04/05/15 6\' 4"  (1.93 m)    Weight:   Wt Readings from Last 1 Encounters:  04/11/15 141 lb 8.6 oz (64.2 kg)    Ideal Body Weight:  91.8 kg  BMI:  Body mass index is 17.24 kg/(m^2).  Estimated Nutritional Needs:   Kcal:  1950-2150  Protein:  95-105 grams  Fluid:  1.2 L/day  EDUCATION NEEDS:   No education needs identified at this time  Corrin Parker, MS, RD, LDN Pager # [phone] After hours/ weekend pager # [phone]

## 2015-04-11 NOTE — Progress Notes (Signed)
Patient sitting in chair and 1100 blood pressure 59/39 (47). Repositioned BP and rechecked reading, placed BP cuff on left leg and rechecked reading, blood pressure still hypotensive.  Patient advised he was feeling increased weakness and sleepiness, patient requested to get back in bed.  Two RN's assisted patient back to bed, patient able to stand and get back in to bed with minimal assistance.  Rechecked blood pressure after patient back in bed, blood pressure 72/48 (57).  Paged MD Titus Mould, new orders received.

## 2015-04-11 NOTE — Progress Notes (Signed)
Patient ID: Khadeem Paola, male   DOB: 1940-04-26, 75 y.o.   MRN: [id number]    Patient Name: Erik Ochoa Date of Encounter: 04/11/2015     Active Problems:   Sepsis ([place])   Pressure ulcer   Acute pulmonary edema ([place])   Left upper extremity swelling   Encounter for central line placement   Pleural effusion    SUBJECTIVE  Dyspnea improved. No chest pain. Still weak.   CURRENT MEDS . antiseptic oral rinse  7 mL Mouth Rinse BID  . doxercalciferol  1 mcg Intravenous Q T,Th,Sa-HD  . feeding supplement (NEPRO CARB STEADY)  237 mL Oral BID BM  . heparin  5,000 Units Subcutaneous 3 times per day  . hydrocortisone sod succinate (SOLU-CORTEF) inj  50 mg Intravenous Q12H   Followed by  . [START ON 04/12/2015] hydrocortisone sod succinate (SOLU-CORTEF) inj  50 mg Intravenous Daily   Followed by  . [START ON 04/13/2015] hydrocortisone sod succinate (SOLU-CORTEF) inj  25 mg Intravenous Daily  . levothyroxine  50 mcg Oral QAC breakfast  . midodrine  20 mg Oral TID WC  . sildenafil  20 mg Oral TID    OBJECTIVE  Filed Vitals:   04/11/15 0700 04/11/15 0730 04/11/15 0800 04/11/15 0936  BP: 80/53  75/49 78/57  Pulse: 39  83 74  Temp:  99.1 F (37.3 C) 98.6 F (37 C)   TempSrc:  Oral Oral   Resp: 23  19 20   Height:      Weight:      SpO2: 100%  100% 99%    Intake/Output Summary (Last 24 hours) at 04/11/15 0941 Last data filed at 04/11/15 0900  Gross per 24 hour  Intake    955 ml  Output   1858 ml  Net   -903 ml   Filed Weights   04/09/15 0300 04/10/15 0431 04/11/15 0500  Weight: 150 lb 5.7 oz (68.2 kg) 141 lb 15.6 oz (64.4 kg) 141 lb 8.6 oz (64.2 kg)    PHYSICAL EXAM  General: frail, chronically ill appearing, NAD. Neuro: Alert and oriented X 3. Moves all extremities spontaneously. Psych: Normal affect. HEENT:  Normal  Neck: Supple without bruits or JVD. Lungs:  Resp regular and unlabored, CTA. Heart: RRR no s3, s4, or murmurs. Abdomen: Soft,  non-tender, non-distended, BS + x 4.  Extremities: No clubbing, cyanosis or edema. DP/PT/Radials 2+ and equal bilaterally.  Accessory Clinical Findings  CBC  Recent Labs  04/09/15 0350 04/10/15 0453  WBC 48.3* 56.8*  NEUTROABS  --  52.8*  HGB 11.8* 12.5*  HCT 40.1 41.4  MCV 101.0* 99.8  PLT 224 [id number]   Basic Metabolic Panel  Recent Labs  04/10/15 0453 04/10/15 1600 04/11/15 0514  NA 138 137 141  K 4.6 4.2 4.1  CL 101 101 105  CO2 24 24 22   GLUCOSE 117* 129* 84  BUN 13 10 10   CREATININE 2.30* 1.91* 1.83*  CALCIUM 8.6* 8.5* 7.9*  MG 2.5*  --  2.1  PHOS 2.8 2.5 2.4*   Liver Function Tests  Recent Labs  04/10/15 1600 04/11/15 0514  ALBUMIN 2.8* 2.6*   No results for input(s): LIPASE, AMYLASE in the last 72 hours. Cardiac Enzymes No results for input(s): CKTOTAL, CKMB, CKMBINDEX, TROPONINI in the last 72 hours. BNP Invalid input(s): POCBNP D-Dimer No results for input(s): DDIMER in the last 72 hours. Hemoglobin A1C No results for input(s): HGBA1C in the last 72 hours. Fasting Lipid Panel No results for input(s):  CHOL, HDL, LDLCALC, TRIG, CHOLHDL, LDLDIRECT in the last 72 hours. Thyroid Function Tests No results for input(s): TSH, T4TOTAL, T3FREE, THYROIDAB in the last 72 hours.  Invalid input(s): FREET3  TELE  Atrial fib with a controlled VR    Radiology/Studies  Dg Chest 2 View  04/03/2015  CLINICAL DATA:  Hypotension, hypoxia EXAM: CHEST  2 VIEW COMPARISON:  05/19/2012; 05/18/2012 FINDINGS: Grossly unchanged enlarged cardiac silhouette and mediastinal contours given reduced lung volumes. The pulmonary vasculature is indistinct with cephalization of flow. Interval development of small to moderate-sized bilateral effusions with associated worsening bilateral mid and lower lung heterogeneous opacities. No pneumothorax. No acute osseus abnormalities. IMPRESSION: Findings worrisome for pulmonary edema with small to moderate sized bilateral effusions and  associated bibasilar opacities, atelectasis versus infiltrate. Electronically Signed   By: Sandi Mariscal M.D.   On: 04/03/2015 11:37   Dg Chest Port 1 View  04/07/2015  CLINICAL DATA:  Right central line placement EXAM: PORTABLE CHEST 1 VIEW COMPARISON:  04/07/2015 FINDINGS: Right jugular central venous catheter with the tip projecting over the SVC. Small bilateral pleural effusions. Mild bilateral interstitial thickening. There is no pneumothorax. The heart and mediastinum are stable. The osseous structures are unremarkable. IMPRESSION: 1. Right jugular central venous catheter with the tip projecting over the SVC. 2. Mild CHF. Electronically Signed   By: Kathreen Devoid   On: 04/07/2015 14:54   Dg Chest Port 1 View  04/07/2015  CLINICAL DATA:  Assess right central line, after being accidentally tugged. Initial encounter. EXAM: PORTABLE CHEST 1 VIEW COMPARISON:  Chest radiograph performed 04/03/2015 FINDINGS: The patient's right IJ dual-lumen catheter has been retracted, now seen ending overlying the proximal SVC. Small bilateral pleural effusions are noted, larger on the right. Bibasilar airspace opacities may reflect mild interstitial edema or pneumonia. No pneumothorax is seen. The cardiomediastinal silhouette is borderline normal in size. No acute osseous abnormalities are identified. IMPRESSION: 1. Right IJ line has been retracted, now seen ending overlying the proximal SVC. 2. Small bilateral pleural effusions, larger on the right. Bibasilar airspace opacities may reflect mild interstitial edema or pneumonia. Electronically Signed   By: Garald Balding M.D.   On: 04/07/2015 01:46   Dg Chest Port 1 View  04/03/2015  CLINICAL DATA:  Encounter for central line placement. EXAM: PORTABLE CHEST - 1 VIEW COMPARISON:  Two-view chest x-ray from the same day. FINDINGS: The heart is enlarged. Atherosclerotic calcifications are present at the aortic arch. Bilateral pleural effusions are again noted. Any right IJ line  is in place. There is no pneumothorax. Lung volumes have slightly improved. IMPRESSION: 1. New right IJ line without radiographic evidence for complication. 2. Slightly improved lung volumes with persistent bilateral pleural effusions and mild diffuse edema. Electronically Signed   By: San Morelle M.D.   On: 04/03/2015 16:40    ASSESSMENT AND PLAN  1. Acute on chronic volume overload - he is off CVVH. Unclear at what point he will restart standard HD. He appears to be euvolemic or maybe a little dry 2. Pulm HTN - he has been started on Revatio. Note need for VQ scan to rule out chronic PE's as per Dr. Aundra Dubin. 3. Atrial fib - he is not on coumadin but should be strongly considered to start. 4. Possible sepsis - cultures negative.  Not on antibiotics. Will follow.  Cristopher Peru, M.D.  Khara Renaud,M.D.  04/11/2015 9:41 AM

## 2015-04-11 NOTE — Progress Notes (Signed)
Patient's blood pressure improved initially after 250 mL bolus but beginning to drop.  1400 blood pressure reading 68/52 (59). Patient currently alert, oriented, and denies dizziness.  Patient stated he is just tired.  Spoke with MD Titus Mould, MD advised if patient is asymptomatic RN to continue to monitor.  MD Titus Mould advised to contact MD if MAP persistently < 50 or if patient becomes symptomatic.

## 2015-04-11 NOTE — Progress Notes (Signed)
Pt bp stayed in the upper 70s and 80s asymptomatic-off levophed runnimg UF even most of the night-Dr [facility] made aware of not restarting CRRT for now till am rounds.

## 2015-04-11 NOTE — Progress Notes (Signed)
PULMONARY / CRITICAL CARE MEDICINE   Name: Janie Missel MRN: [id number] DOB: 01-09-1941    ADMISSION DATE:  04/03/2015 CONSULTATION DATE:  2/24  REFERRING MD:  Sabra Heck   CHIEF COMPLAINT:  Hypotension   BRIEF Hx:   75 year old male with a PMH of HTN, CHF, AF (not on AC), PH, Q2 week paracentesis at the [place] and ESRD who presented 2/24 to an outpatient vascular center for planned left balloon angioplasty of L AVF.  Post procedure had ongoing hypotension and was referred to ED for evaluation.  Admitted with working dx of sepsis (doubt infection).  To ICU for CVVHD in the setting of shock thought related to pulmonary hypertension  Was on levophed.  RHC with normal PCWP but elevated R-sided filling pressures suggestive of RV failure. 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   SUBJECTIVE:  On bedside potty seat, off cvvhd  VITAL SIGNS: BP 75/49 mmHg  Pulse 83  Temp(Src) 98.6 F (37 C) (Oral)  Resp 19  Ht 6\' 4"  (1.93 m)  Wt 64.2 kg (141 lb 8.6 oz)  BMI 17.24 kg/m2  SpO2 100%  HEMODYNAMICS: CVP:  [3 mmHg-10 mmHg] 7 mmHg  VENTILATOR SETTINGS:    INTAKE / OUTPUT: I/O last 3 completed shifts: In: 1626.2 [P.O.:945; I.V.:381.2; IV Piggyback:300] Out: [id number] [Other:[id number]]  PHYSICAL EXAMINATION: General:  No distress Neuro:  Awake, oriented. No focal def  HEENT:  Poor dentition, Atraumatic, normocephalic,white sclera Cardiovascular: Irregular, no r Lungs:  Clear, diminished towards  bases  Abdomen soft non tender, + bowel sounds Musculoskeletal:   Moderate tone, BLE edema Skin:  Mild abrasion on the left elbow, warm/dry, ulceration on R shoulder  LABS:  BMET  Recent Labs Lab 04/10/15 0453 04/10/15 1600 04/11/15 0514  NA 138 137 141  K 4.6 4.2 4.1  CL 101 101 105  CO2 24 24 22   BUN 13 10 10   CREATININE 2.30* 1.91* 1.83*  GLUCOSE 117* 129* 84    Electrolytes  Recent Labs Lab 04/09/15 0350  04/10/15 0453 04/10/15 1600  04/11/15 0514  CALCIUM 8.3*  < > 8.6* 8.5* 7.9*  MG 2.5*  --  2.5*  --  2.1  PHOS 2.5  < > 2.8 2.5 2.4*  < > = values in this interval not displayed.  CBC  Recent Labs Lab 04/08/15 0425 04/09/15 0350 04/10/15 0453  WBC 51.2* 48.3* 56.8*  HGB 11.6* 11.8* 12.5*  HCT 38.7* 40.1 41.4  PLT 234 224 262    Coag's  Recent Labs Lab 04/07/15 0422 04/08/15 0425 04/09/15 0350  APTT 33 37 33    Sepsis Markers  Recent Labs Lab 04/09/15 0350 04/09/15 0635 04/10/15 0453 04/11/15 0514  LATICACIDVEN  --  1.1  --   --   PROCALCITON 0.53  --  0.69 0.58    ABG No results for input(s): PHART, PCO2ART, PO2ART in the last 168 hours.  Liver Enzymes  Recent Labs Lab 04/10/15 0453 04/10/15 1600 04/11/15 0514  ALBUMIN 2.9* 2.8* 2.6*    Cardiac Enzymes No results for input(s): TROPONINI, PROBNP in the last 168 hours.  Glucose No results for input(s): GLUCAP in the last 168 hours.  Imaging No results found.   STUDIES:  ECHO 02/27 - LV EF 60-65%, D-shaped interventricular septum suggestive of RV pressure/volume overload, severely dilated RV with severe RV systolic dysfunction, PASP 59 mmHg. Moderate AS, mild MR.  RUE [place] 2/27 >> neg for DVT, no abscess RHC 3/1 >> normal  PCWP (9), elevated right sided filling pressures suggestive of R heart failure, moderated PAH (53/17)  CULTURES: BCX2 2/24 >>   ANTIBIOTICS: Vanc 2/24 >> 2/27 Zosyn 2/24 >>  SIGNIFICANT EVENTS: 2/24  Admitted with hypotension, concern for septic shock from Cellulitis.  CRRT initiated.  2/27  Paracentesis completed; 4.5L off 2/28  Trialysis cath partially pulled out by patient; replaced, CRRT continues 3/01  RHC for persistent hypotension, thought related to [facility]  3/4- off cvvhd  LINES/TUBES: Dialysis Catheter R IJ 2/24 >>  DISCUSSION: 75 year old male w/ ESRD. Takes midodrine for hypotension on dialysis days (BP in 90s). Last HD was Tuesday 2/12. Went to out-pt vascular procedure facility on 2/24  for elective balloon angioplasty of L AVF. Post-procedure developed persistent hypotension w/ room air sats 88% and BP in 70s. No clear source of infection.  Hypotension thought related to pulmonary hypertension.  Marland Kitchen RHC with normal PCWP but elevated R-sided filling pressures suggestive of RV failure. 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 Revatio 20 mg TID started by Card   ASSESSMENT / PLAN:  PULMONARY A: Pleural effusions / pulmonary edema  Pulmonary Hypertension - moderate PAH on RHC P:   Pulmonary hygiene O2 as needed Revatio started by Card May need thora diagnostic pre dc  CARDIOVASCULAR A:  Septic shock - source unclear, low dose levophed continues.  ? Baseline BP readings Chronic Afib - rate controlled. Not on anticoagulation per patient Hypotensive - likely baseline hypotension.  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  P:  Keep tele Continue midodrine TID  Revatio as above  RENAL A:   ESRD - on HD, does not make urine AG metabolic acidosis - resolved  Hyperkalemia resolved    P:   Off cvvhd Monday HD Access issues per renal   GASTROINTESTINAL A:   Frequent paracentesis - at the [place], records indicate ESRD associated ascites ?? Hypoalbuminemia  ? Liver disease > could possibly explain baseline hypotension. No evidence.  Ascites due to RHF P:   Renal diet as tolerated Aspiration precautions, SLP following  Trend AST,ALT in am   HEMATOLOGIC A:   Anemia of chronic disease  Leukocytosis -pt reports a congential defect hemoconcentrated last 24 hr P:   continue to Trend cbc in am for wbc trend  Continue [place] heparin   INFECTIOUS A:   R/o septic shock - currently potential sources include LE cellulitis; but also consider transient bacteremia (possibly infected AVF? ) No clear source.  Doubt infectious etiology of hypotension.   Leukocytosis - Afebrile, on stress dose steroids P:   Received empiric abx I am not impressed was ever infected  ENDOCRINE A:   Hypothyroidism  At Risk Hyperglycemia in setting of stress dose steroids  Adrenal Insufficiency  P:   Cont synthroid  Taper roids down to off  NEUROLOGIC A:   H/o peripheral neuropathy  P:  Assess pain Get pt  To triad, tele   Lavon Paganini. Titus Mould, MD, [place] Pgr: [facility] 04/11/2015 9:11 AM

## 2015-04-12 ENCOUNTER — Inpatient Hospital Stay (HOSPITAL_COMMUNITY): Payer: Medicare Other

## 2015-04-12 DIAGNOSIS — E2740 Unspecified adrenocortical insufficiency: Secondary | ICD-10-CM

## 2015-04-12 DIAGNOSIS — K7460 Unspecified cirrhosis of liver: Secondary | ICD-10-CM

## 2015-04-12 LAB — CBC WITH DIFFERENTIAL/PLATELET
BASOS ABS: 0 10*3/uL (ref 0.0–0.1)
Basophils Relative: 0 %
EOS ABS: 0 10*3/uL (ref 0.0–0.7)
Eosinophils Relative: 0 %
HCT: 38 % — ABNORMAL LOW (ref 39.0–52.0)
HEMOGLOBIN: 12.1 g/dL — AB (ref 13.0–17.0)
LYMPHS ABS: 3.3 10*3/uL (ref 0.7–4.0)
Lymphocytes Relative: 7 %
MCH: 31.8 pg (ref 26.0–34.0)
MCHC: 31.8 g/dL (ref 30.0–36.0)
MCV: 100 fL (ref 78.0–100.0)
MONO ABS: 0.5 10*3/uL (ref 0.1–1.0)
Monocytes Relative: 1 %
NEUTROS ABS: 43.4 10*3/uL — AB (ref 1.7–7.7)
Neutrophils Relative %: 92 %
PLATELETS: 202 10*3/uL (ref 150–400)
RBC: 3.8 MIL/uL — AB (ref 4.22–5.81)
RDW: 20.6 % — AB (ref 11.5–15.5)
WBC: 47.2 10*3/uL — AB (ref 4.0–10.5)

## 2015-04-12 LAB — RENAL FUNCTION PANEL
ALBUMIN: 2.5 g/dL — AB (ref 3.5–5.0)
ANION GAP: 11 (ref 5–15)
BUN: 27 mg/dL — ABNORMAL HIGH (ref 6–20)
CO2: 24 mmol/L (ref 22–32)
CREATININE: 3.66 mg/dL — AB (ref 0.61–1.24)
Calcium: 8.1 mg/dL — ABNORMAL LOW (ref 8.9–10.3)
Chloride: 101 mmol/L (ref 101–111)
GFR calc Af Amer: 17 mL/min — ABNORMAL LOW (ref >60)
GFR calc non Af Amer: 15 mL/min — ABNORMAL LOW (ref >60)
GLUCOSE: 116 mg/dL — AB (ref 65–99)
Phosphorus: 3.4 mg/dL (ref 2.5–4.6)
Potassium: 4.1 mmol/L (ref 3.5–5.1)
SODIUM: 136 mmol/L (ref 135–145)

## 2015-04-12 MED ORDER — TECHNETIUM TO 99M ALBUMIN AGGREGATED
4.0000 | Freq: Once | INTRAVENOUS | Status: AC | PRN
  Administered 2015-04-12: 4 via INTRAVENOUS

## 2015-04-12 MED ORDER — TECHNETIUM TC 99M DIETHYLENETRIAME-PENTAACETIC ACID: 30.2000 | Freq: Once | INTRAVENOUS | Status: DC | PRN

## 2015-04-12 MED ORDER — ALBUMIN HUMAN 5 % IV SOLN
50.0000 g | Freq: Once | INTRAVENOUS | Status: AC
  Administered 2015-04-12: 50 g via INTRAVENOUS
  Filled 2015-04-12: qty 750
  Filled 2015-04-12: qty 250

## 2015-04-12 NOTE — Progress Notes (Signed)
Patient ID: Erik Ochoa, male   DOB: Feb 05, 1941, 75 y.o.   MRN: [id number]   SUBJECTIVE:   RHC with normal PCWP but elevated R-sided filling pressures suggestive of RV failure. 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  Revatio 20 mg TID started.   He is now off pressors and CVVH.  SBP remains soft in 80s for the most part, he is asymptomatic.  Has been up in the chair.  CVP ranging 5-9.   Echo: LV EF 60-65%, D-shaped interventricular septum suggestive of RV pressure/volume overload, severely dilated RV with severe RV systolic dysfunction, PASP 59 mmHg. Moderate AS, mild MR.   RHC Procedural Findings: Hemodynamics (mmHg) RA mean 14 RV 48/13 PA 53/17, mean 30 PCWP mean 9 Oxygen saturations: PA 53% AO 98% Cardiac Output (Fick) 3.84  Cardiac Index (Fick) 1.87 PVR 5.5 WU Cardiac Output (Thermodilution) 4.27 Cardiac Index (Thermodilution) 2.08  PVR 4.91 WU  Scheduled Meds: . antiseptic oral rinse  7 mL Mouth Rinse BID  . doxercalciferol  1 mcg Intravenous Q T,Th,Sa-HD  . feeding supplement (NEPRO CARB STEADY)  237 mL Oral TID BM  . heparin  5,000 Units Subcutaneous 3 times per day  . hydrocortisone sod succinate (SOLU-CORTEF) inj  50 mg Intravenous Daily   Followed by  . [START ON 04/13/2015] hydrocortisone sod succinate (SOLU-CORTEF) inj  25 mg Intravenous Daily  . levothyroxine  50 mcg Oral QAC breakfast  . midodrine  20 mg Oral TID WC  . sildenafil  20 mg Oral TID   Continuous Infusions:   PRN Meds:.sodium chloride, acetaminophen, alteplase, heparin, heparin, ondansetron (ZOFRAN) IV, pentafluoroprop-tetrafluoroeth    Filed Vitals:   04/12/15 0500 04/12/15 0630 04/12/15 0700 04/12/15 0730  BP: 89/57 87/60 82/52  74/52  Pulse: 82 45 69 72  Temp:      TempSrc:      Resp: 22 21 18 23   Height:      Weight: 149 lb 7.6 oz (67.8 kg)     SpO2: 94% 95% 96% 98%    Intake/Output Summary (Last 24 hours) at 04/12/15 0803 Last data filed at  04/12/15 0700  Gross per 24 hour  Intake   1430 ml  Output      0 ml  Net   1430 ml    LABS: Basic Metabolic Panel:  Recent Labs  04/10/15 0453  04/11/15 0514 04/12/15 0325  NA 138  < > 141 136  K 4.6  < > 4.1 4.1  CL 101  < > 105 101  CO2 24  < > 22 24  GLUCOSE 117*  < > 84 116*  BUN 13  < > 10 27*  CREATININE 2.30*  < > 1.83* 3.66*  CALCIUM 8.6*  < > 7.9* 8.1*  MG 2.5*  --  2.1  --   PHOS 2.8  < > 2.4* 3.4  < > = values in this interval not displayed. Liver Function Tests:  Recent Labs  04/11/15 0514 04/12/15 0325  ALBUMIN 2.6* 2.5*   No results for input(s): LIPASE, AMYLASE in the last 72 hours. CBC:  Recent Labs  04/10/15 0453 04/12/15 0325  WBC 56.8* 47.2*  NEUTROABS 52.8* 43.4*  HGB 12.5* 12.1*  HCT 41.4 38.0*  MCV 99.8 100.0  PLT 262 202   Cardiac Enzymes: No results for input(s): CKTOTAL, CKMB, CKMBINDEX, TROPONINI in the last 72 hours. BNP: Invalid input(s): POCBNP D-Dimer: No results for input(s): DDIMER in the last 72 hours. Hemoglobin A1C:  No results for input(s): HGBA1C in the last 72 hours. Fasting Lipid Panel: No results for input(s): CHOL, HDL, LDLCALC, TRIG, CHOLHDL, LDLDIRECT in the last 72 hours. Thyroid Function Tests: No results for input(s): TSH, T4TOTAL, T3FREE, THYROIDAB in the last 72 hours.  Invalid input(s): FREET3 Anemia Panel: No results for input(s): VITAMINB12, FOLATE, FERRITIN, TIBC, IRON, RETICCTPCT in the last 72 hours.  RADIOLOGY: Dg Chest 2 View  04/03/2015  CLINICAL DATA:  Hypotension, hypoxia EXAM: CHEST  2 VIEW COMPARISON:  05/19/2012; 05/18/2012 FINDINGS: Grossly unchanged enlarged cardiac silhouette and mediastinal contours given reduced lung volumes. The pulmonary vasculature is indistinct with cephalization of flow. Interval development of small to moderate-sized bilateral effusions with associated worsening bilateral mid and lower lung heterogeneous opacities. No pneumothorax. No acute osseus abnormalities.  IMPRESSION: Findings worrisome for pulmonary edema with small to moderate sized bilateral effusions and associated bibasilar opacities, atelectasis versus infiltrate. Electronically Signed   By: Sandi Mariscal M.D.   On: 04/03/2015 11:37   Dg Chest Port 1 View  04/07/2015  CLINICAL DATA:  Right central line placement EXAM: PORTABLE CHEST 1 VIEW COMPARISON:  04/07/2015 FINDINGS: Right jugular central venous catheter with the tip projecting over the SVC. Small bilateral pleural effusions. Mild bilateral interstitial thickening. There is no pneumothorax. The heart and mediastinum are stable. The osseous structures are unremarkable. IMPRESSION: 1. Right jugular central venous catheter with the tip projecting over the SVC. 2. Mild CHF. Electronically Signed   By: Kathreen Devoid   On: 04/07/2015 14:54   Dg Chest Port 1 View  04/07/2015  CLINICAL DATA:  Assess right central line, after being accidentally tugged. Initial encounter. EXAM: PORTABLE CHEST 1 VIEW COMPARISON:  Chest radiograph performed 04/03/2015 FINDINGS: The patient's right IJ dual-lumen catheter has been retracted, now seen ending overlying the proximal SVC. Small bilateral pleural effusions are noted, larger on the right. Bibasilar airspace opacities may reflect mild interstitial edema or pneumonia. No pneumothorax is seen. The cardiomediastinal silhouette is borderline normal in size. No acute osseous abnormalities are identified. IMPRESSION: 1. Right IJ line has been retracted, now seen ending overlying the proximal SVC. 2. Small bilateral pleural effusions, larger on the right. Bibasilar airspace opacities may reflect mild interstitial edema or pneumonia. Electronically Signed   By: Garald Balding M.D.   On: 04/07/2015 01:46   Dg Chest Port 1 View  04/03/2015  CLINICAL DATA:  Encounter for central line placement. EXAM: PORTABLE CHEST - 1 VIEW COMPARISON:  Two-view chest x-ray from the same day. FINDINGS: The heart is enlarged. Atherosclerotic  calcifications are present at the aortic arch. Bilateral pleural effusions are again noted. Any right IJ line is in place. There is no pneumothorax. Lung volumes have slightly improved. IMPRESSION: 1. New right IJ line without radiographic evidence for complication. 2. Slightly improved lung volumes with persistent bilateral pleural effusions and mild diffuse edema. Electronically Signed   By: San Morelle M.D.   On: 04/03/2015 16:40    PHYSICAL EXAM General: NAD Neck: JVP 8 cm, no thyromegaly or thyroid nodule.  Lungs: Slight decreased breath sounds at bases.  CV: Nondisplaced PMI.  Heart irregular S1/S2, no S3/S4, 2/6 SEM RUSB.  No peripheral edema.   Abdomen: Soft, NT, ND, no HSM. No bruits or masses. +BS.  Neurologic: Alert and oriented x 3.  Psych: Normal affect. Extremities: No clubbing or cyanosis.   TELEMETRY: Reviewed telemetry pt in atrial fibrillation in 60-70s  ASSESSMENT AND PLAN: 75 yo with history of chronic atrial fibrillation, ESRD and  diastolic CHF/RV failure presented with hypotension and volume overloaded.  1. Atrial fibrillation: Chronic. Has not been on anticoagulation, per him, because he "bruises too much."  - We discussed restarting coumadin.  He says that he will think about it.  Can readdress prior to discharge, would need to be followed at [facility]. 2. Acute on chronic diastolic CHF with prominent RV failure: Normal LV EF but severely dilated RV with severely decreased RV systolic function. He was admitted with marked volume overload. He initially had CVVH with good fluid removal.  Now off pressors.  - Continue midodrine.  - RHC showed primarily RV failure. Revatio started for [facility].  - Plan for HD tomorrow.  3. Pulmonary hypertension: High PA pressure on echo and severe RV systolic dysfunction. Possible pulmonary venous hypertension from LV diastolic dysfunction, but he has quite severe RV dysfunction so concern for intrinsic pulmonary vascular disease.  -  As above, RHC with normal PCWP but elevated filling pressures.  - Will arrange for V/Q scan to rule out chronic PEs.  - Rheumatologic serologies.  - Continue revatio 20 mg TID, would not titrate up with soft BP.  4. ID: Initial concern for septic shock, but no infectious source found so far. I wonder if most of the problem is not RV failure with resulting hypotension/volume overload.  Now off antibiotics. 5. Murmur: Moderate aortic stenosis.   Loralie Champagne 04/12/2015 8:03 AM

## 2015-04-12 NOTE — Progress Notes (Signed)
Erik Ochoa Progress Note   Subjective:  No new events overnight, occ hypotension CVP5-9 Off ABX   Filed Vitals:   04/12/15 0400 04/12/15 0404 04/12/15 0500 04/12/15 0630  BP: 69/50 79/52 89/57  87/60  Pulse: 62 71 82 45  Temp: 98.7 F (37.1 C)     TempSrc: Oral     Resp: 15 17 22 21   Height:      Weight:   67.8 kg (149 lb 7.6 oz)   SpO2: 95% 94% 94% 95%    Inpatient medications: . antiseptic oral rinse  7 mL Mouth Rinse BID  . doxercalciferol  1 mcg Intravenous Q T,Th,Sa-HD  . feeding supplement (NEPRO CARB STEADY)  237 mL Oral TID BM  . heparin  5,000 Units Subcutaneous 3 times per day  . hydrocortisone sod succinate (SOLU-CORTEF) inj  50 mg Intravenous Daily   Followed by  . [START ON 04/13/2015] hydrocortisone sod succinate (SOLU-CORTEF) inj  25 mg Intravenous Daily  . levothyroxine  50 mcg Oral QAC breakfast  . midodrine  20 mg Oral TID WC  . sildenafil  20 mg Oral TID     sodium chloride, acetaminophen, alteplase, heparin, heparin, ondansetron (ZOFRAN) IV, pentafluoroprop-tetrafluoroeth  Exam: General:NAD, chronically ill appearing Neck: Supple. JVD not elevated. R IJ TDC Lungs: Dim BS Breathing is unlabored. Heart: RRR 2/6 murmur Abdomen: Soft, 2+ ascites as usual Ext: 1+ depend hip/ thigh edema bilat Neuro: Alert and oriented X 3. Moves all ext Dialysis Access: very faint bruit in juxta region  Current CRRT Prescription: Start Date: 2/24 Catheter: R IJ BFR: 250 Pre Blood Pump: 400 4K DFR: 1700 4K Replacement Rate: 200 4K Goal UF: 252mL/hr neg Anticoagulation: none in circuit Clotting: none   Dialysis Orders: [facility] 4.15 180 450/800 EDW 76 2 K 2.25 Ca no profile left lower AVF hectorol 1 heparin 8400 venofer 50 2/16 - last hgb 11.5   Assessment/Plan: 1. Sepsis with persistent leukocytosis: off ABX, neg cultures; on stress steroids 2. ESRD - With a poorly functioning AVF S/p access angioplasty the morning of admission. On CRRT  for now via [place] placed 2/24.  I doubt AVF is going to be usable moving forward 3. Chronic hypotension - back on 20 tid of midodrine as at home.  Stable, tolerating SBP >80.   4. Anemia - Hb stable - hold venofer for now.  5. Metabolic bone disease - Last iPTH 436 - on hectorol 1 - -probably needs increased - no adjustment made after Jan labs.- wait on change for now; phoslo 3 ac tid 6. Nutrition - poor alb 2.7  7. Chronic ascites - requires periodic paracentesis 8. R sided CHF with pulmonary HTN: RHC 3/1 with nl PCWP. Now on Sildenafil for pHTN  Plan - iHD attempted tomorrow. Need midodrine pre.  Try using AVF if RN feels able to but not confident will work.  If not, use temp cath and plan for [facility] placement this week.  Gentle UF, SBP > 80 with HD   Erik Ochoa B   04/12/2015, 6:54 AM    Recent Labs Lab 04/10/15 1600 04/11/15 0514 04/12/15 0325  NA 137 141 136  K 4.2 4.1 4.1  CL 101 105 101  CO2 24 22 24   GLUCOSE 129* 84 116*  BUN 10 10 27*  CREATININE 1.91* 1.83* 3.66*  CALCIUM 8.5* 7.9* 8.1*  PHOS 2.5 2.4* 3.4    Recent Labs Lab 04/10/15 1600 04/11/15 0514 04/12/15 0325  ALBUMIN 2.8* 2.6* 2.5*  Recent Labs Lab 04/09/15 0350 04/10/15 0453 04/12/15 0325  WBC 48.3* 56.8* 47.2*  NEUTROABS  --  52.8* 43.4*  HGB 11.8* 12.5* 12.1*  HCT 40.1 41.4 38.0*  MCV 101.0* 99.8 100.0  PLT 224 262 202

## 2015-04-12 NOTE — Progress Notes (Signed)
Spoke with MD Aundra Dubin regarding patient's hypotension and Revatio. MD Aundra Dubin advised if patient asymptomatic, continue to give Revatio as ordered.

## 2015-04-12 NOTE — Progress Notes (Signed)
[facility] Progress Note  Patient: Erik Ochoa [id number]   PCP: Raquel James DOB: [birth year]   DOA: 04/03/2015   DOS: 04/12/2015   Date of Service: the patient was seen and examined on 04/12/2015  Subjective: pt does not have any acute complain at present No nausea or vomiting, no pain no shortness of breath, no confusion, dizziness or lethargy  Nutrition: tolerating oral diet Activity: walking in the room Last BM: 04/11/2015  Assessment and Plan: 1. Shock ([place]) Mostly cardiogenic shock Sepsis ruled out Patient's echogram shows 60-65% EF with pulmonary hypertension and RV failure Status post right heart cath, PCWP normal, elevated filling pressure. Moderate pulmonary artery hypertension/ VQ scan negative Started on sildenafil. Cardiology following, will continue to be monitored in the stepdown unit. Blood pressure continues to remain lower. We'll provide albumin infusion Patient is just off the pressors day 1  2. ESRD. On hemodialysis nephrology following. Initially was on CRRT  3. Leukocytosis. Essential thrombocytosis. Appears chronic and worsening with stress dose steroids. We will continue to closely monitor.  4. Ascites. Abnormal LFTs. Ultrasound liver in the morning.  5. 2. Protein calorie malnutrition. Continue supplements.  6. Hypothyroidism. Continue Synthroid.  7. Presumed adrenal insufficiency. Currently on stress dose steroids. We'll continue at present  DVT Prophylaxis: subcutaneous Heparin Nutrition: Renal diet Advance goals of care discussion: Full code  Brief Summary of Hospitalization:  HPI: As per the H and P dictated on admission, "75 year old male w/ ESRD. Has been in [place] w/out complaints. Presented to Out-pt vascular center for planned left balloon angioplasty of Left AVF on 2/24. Procedure unremarkable but was hypotensive persistantly after. No HD since 2/21. In ED found to be hypotensive w/ SBP in 70s. Reported he was in  usual health. Did feel "lethargic" but he attributed this to poor sleep. On PE has significant LE erythema and warmth to BLEs. Will admit w/ working dx of sepsis (potential sources LE edema vs possible bacterial shed for infected AVF? ). " Daily update: Transfer to hospitalist 04/12/2015 Procedures: Right heart cath Consultants: Cardiology, nephrology, critical care primary admission Antibiotics: Anti-infectives    Start     Dose/Rate Route Frequency Ordered Stop   04/04/15 1700  vancomycin (VANCOCIN) IVPB 1000 mg/200 mL premix  Status:  Discontinued     1,000 mg 200 mL/hr over 60 Minutes Intravenous Every 24 hours 04/03/15 1725 04/06/15 0956   04/03/15 2200  piperacillin-tazobactam (ZOSYN) IVPB 2.25 g  Status:  Discontinued     2.25 g 100 mL/hr over 30 Minutes Intravenous 3 times per day 04/03/15 1518 04/03/15 1723   04/03/15 1800  piperacillin-tazobactam (ZOSYN) IVPB 3.375 g  Status:  Discontinued     3.375 g 100 mL/hr over 30 Minutes Intravenous 4 times per day 04/03/15 1725 04/11/15 0919   04/03/15 1530  vancomycin (VANCOCIN) 1,500 mg in sodium chloride 0.9 % 500 mL IVPB     1,500 mg 250 mL/hr over 120 Minutes Intravenous  Once 04/03/15 1519 04/03/15 1933   04/03/15 1300  vancomycin (VANCOCIN) IVPB 1000 mg/200 mL premix  Status:  Discontinued     1,000 mg 200 mL/hr over 60 Minutes Intravenous  Once 04/03/15 1255 04/03/15 1518   04/03/15 1300  piperacillin-tazobactam (ZOSYN) IVPB 3.375 g  Status:  Discontinued     3.375 g 100 mL/hr over 30 Minutes Intravenous  Once 04/03/15 1255 04/03/15 1725       Family Communication: no family was present at bedside, at the time of interview.  Disposition:  Barriers to safe discharge: Improvement in blood pressure   Intake/Output Summary (Last 24 hours) at 04/12/15 1332 Last data filed at 04/12/15 1000  Gross per 24 hour  Intake   1400 ml  Output      0 ml  Net   1400 ml   Filed Weights   04/10/15 0431 04/11/15 0500 04/12/15 0500   Weight: 64.4 kg (141 lb 15.6 oz) 64.2 kg (141 lb 8.6 oz) 67.8 kg (149 lb 7.6 oz)    Objective: Physical Exam: Filed Vitals:   04/12/15 0700 04/12/15 0730 04/12/15 0800 04/12/15 1200  BP: 82/52 74/52 78/56    Pulse: 69 72 70   Temp:   97.7 F (36.5 C) 97.4 F (36.3 C)  TempSrc:   Oral Oral  Resp: 18 23 18    Height:      Weight:      SpO2: 96% 98% 97%      General: Appear in mild distress, no Rash; Oral Mucosa moist. Cardiovascular: S1 and S2 Present, aortic Murmur, positive JVD Respiratory: Bilateral Air entry present and faint basal Crackles, no wheezes Abdomen: Bowel Sound present, Soft and no tenderness Extremities: no Pedal edema, no calf tenderness Neurology: Grossly no focal neuro deficit.  Data Reviewed: CBC:  Recent Labs Lab 04/07/15 0422 04/08/15 0425 04/09/15 0350 04/10/15 0453 04/12/15 0325  WBC 49.3* 51.2* 48.3* 56.8* 47.2*  NEUTROABS  --   --   --  52.8* 43.4*  HGB 11.5* 11.6* 11.8* 12.5* 12.1*  HCT 38.1* 38.7* 40.1 41.4 38.0*  MCV 99.2 100.3* 101.0* 99.8 100.0  PLT 217 234 224 262 [id number]   Basic Metabolic Panel:  Recent Labs Lab 04/07/15 0422  04/08/15 0425  04/09/15 0350 04/09/15 1843 04/10/15 0453 04/10/15 1600 04/11/15 0514 04/12/15 0325  NA 141  < > 140  < > 138 138 138 137 141 136  K 4.7  < > 4.4  < > 4.5 4.4 4.6 4.2 4.1 4.1  CL 104  < > 102  < > 105 102 101 101 105 101  CO2 26  < > 27  < > 26 23 24 24 22 24   GLUCOSE 125*  < > 110*  < > 110* 154* 117* 129* 84 116*  BUN 16  < > 13  < > 11 11 13 10 10  27*  CREATININE 2.44*  < > 2.14*  < > 1.90* 1.89* 2.30* 1.91* 1.83* 3.66*  CALCIUM 8.4*  < > 8.4*  < > 8.3* 8.5* 8.6* 8.5* 7.9* 8.1*  MG 2.4  --  2.6*  --  2.5*  --  2.5*  --  2.1  --   PHOS 3.8  < > 3.3  < > 2.5 2.6 2.8 2.5 2.4* 3.4  < > = values in this interval not displayed. Liver Function Tests:  Recent Labs Lab 04/09/15 1843 04/10/15 0453 04/10/15 1600 04/11/15 0514 04/12/15 0325  ALBUMIN 3.0* 2.9* 2.8* 2.6* 2.5*   No  results for input(s): LIPASE, AMYLASE in the last 168 hours. No results for input(s): AMMONIA in the last 168 hours.  Cardiac Enzymes: No results for input(s): CKTOTAL, CKMB, CKMBINDEX, TROPONINI in the last 168 hours.  BNP (last 3 results) No results for input(s): BNP in the last 8760 hours.  CBG: No results for input(s): GLUCAP in the last 168 hours.  Recent Results (from the past 240 hour(s))  Culture, blood (Routine X 2) w Reflex to ID Panel     Status: None   Collection  Time: 04/03/15  4:15 PM  Result Value Ref Range Status   Specimen Description BLOOD  Final   Special Requests BOTTLES DRAWN AEROBIC AND ANAEROBIC 5CC RJV  Final   Culture NO GROWTH 5 DAYS  Final   Report Status 04/09/2015 FINAL  Final  Culture, blood (Routine X 2) w Reflex to ID Panel     Status: None   Collection Time: 04/03/15  4:20 PM  Result Value Ref Range Status   Specimen Description BLOOD RIGHT HAND  Final   Special Requests BOTTLES DRAWN AEROBIC AND ANAEROBIC 5CC  Final   Culture NO GROWTH 5 DAYS  Final   Report Status 04/09/2015 FINAL  Final  MRSA PCR Screening     Status: None   Collection Time: 04/03/15 10:14 PM  Result Value Ref Range Status   MRSA by PCR NEGATIVE NEGATIVE Final    Comment:        The GeneXpert MRSA Assay (FDA approved for NASAL specimens only), is one component of a comprehensive MRSA colonization surveillance program. It is not intended to diagnose MRSA infection nor to guide or monitor treatment for MRSA infections.   C difficile quick scan w PCR reflex     Status: None   Collection Time: 04/07/15  9:49 AM  Result Value Ref Range Status   C Diff antigen NEGATIVE NEGATIVE Final   C Diff toxin NEGATIVE NEGATIVE Final   C Diff interpretation Negative for toxigenic C. difficile  Final     Studies: Nm Pulmonary Perf And Vent  04/12/2015  CLINICAL DATA:  Dyspnea, chronic atrial fibrillation, end-stage renal disease on dialysis, diastolic CHF heart failure, hypotension,  volume overload, pulmonary hypertension, question chronic pulmonary embolism EXAM: NUCLEAR MEDICINE VENTILATION - PERFUSION LUNG SCAN TECHNIQUE: Ventilation images were obtained in multiple projections using inhaled aerosol Tc-9m DTPA. Perfusion images were obtained in multiple projections after intravenous injection of Tc-48m MAA. RADIOPHARMACEUTICALS:  [id number] millicuries [id number] DTPA aerosol inhalation and 4.0 millicuries [id number] MAA IV COMPARISON:  None; correlation chest radiograph 04/07/2015, none more recent FINDINGS: Ventilation: Diffusely diminished ventilation in the upper lobes. Ventilation defects that LEFT lower lobe base and the posterior RIGHT lower lobe. Central airway deposition of tracer. Perfusion: Small subsegmental perfusion defect LEFT upper lobe. Diminished perfusion at the posterior aspect of the RIGHT lower lobe and at the posterior LEFT lower lobe base, sites corresponding to pleural effusions and atelectasis on chest radiograph and matching the ventilatory abnormalities. No additional segmental or subsegmental perfusion defects identified. IMPRESSION: Matching abnormal ventilation and perfusion at the lower lobes corresponding to areas of atelectasis and effusion seen on chest radiography, though the available chest radiograph is 22 days old. Observed perfusion abnormalities are less than expected based on a degree of atelectasis and volumes of pleural effusions identified by radiography. Findings represent a low probability for pulmonary embolism. Electronically Signed   By: Lavonia Dana M.D.   On: 04/12/2015 10:19     Scheduled Meds: . antiseptic oral rinse  7 mL Mouth Rinse BID  . doxercalciferol  1 mcg Intravenous Q T,Th,Sa-HD  . feeding supplement (NEPRO CARB STEADY)  237 mL Oral TID BM  . heparin  5,000 Units Subcutaneous 3 times per day  . [START ON 04/13/2015] hydrocortisone sod succinate (SOLU-CORTEF) inj  25 mg Intravenous Daily  . levothyroxine  50 mcg Oral QAC  breakfast  . midodrine  20 mg Oral TID WC  . sildenafil  20 mg Oral TID   Continuous Infusions:  PRN Meds: sodium chloride,  acetaminophen, alteplase, heparin, heparin, ondansetron (ZOFRAN) IV, pentafluoroprop-tetrafluoroeth, technetium TC 36M diethylenetriame-pentaacetic acid  Time spent: 30 minutes  Author: Berle Mull, MD [facility] Pager: [phone] 04/12/2015 1:32 PM  If 7PM-7AM, please contact night-coverage at [email or web address], password [facility]

## 2015-04-13 LAB — CBC WITH DIFFERENTIAL/PLATELET
BASOS ABS: 0 10*3/uL (ref 0.0–0.1)
Basophils Relative: 0 %
EOS ABS: 0.3 10*3/uL (ref 0.0–0.7)
Eosinophils Relative: 1 %
HCT: 35.3 % — ABNORMAL LOW (ref 39.0–52.0)
HEMOGLOBIN: 10.5 g/dL — AB (ref 13.0–17.0)
LYMPHS ABS: 2.6 10*3/uL (ref 0.7–4.0)
Lymphocytes Relative: 9 %
MCH: 29.7 pg (ref 26.0–34.0)
MCHC: 29.7 g/dL — AB (ref 30.0–36.0)
MCV: 100 fL (ref 78.0–100.0)
MONO ABS: 0.3 10*3/uL (ref 0.1–1.0)
MONOS PCT: 1 %
Neutro Abs: 26 10*3/uL — ABNORMAL HIGH (ref 1.7–7.7)
Neutrophils Relative %: 89 %
PLATELETS: 174 10*3/uL (ref 150–400)
RBC: 3.53 MIL/uL — AB (ref 4.22–5.81)
RDW: 20.4 % — AB (ref 11.5–15.5)
WBC: 29.2 10*3/uL — AB (ref 4.0–10.5)

## 2015-04-13 LAB — COMPREHENSIVE METABOLIC PANEL
ALT: 20 U/L (ref 17–63)
ANION GAP: 13 (ref 5–15)
AST: 21 U/L (ref 15–41)
Albumin: 2.9 g/dL — ABNORMAL LOW (ref 3.5–5.0)
Alkaline Phosphatase: 66 U/L (ref 38–126)
BILIRUBIN TOTAL: 0.7 mg/dL (ref 0.3–1.2)
BUN: 43 mg/dL — AB (ref 6–20)
CALCIUM: 7.9 mg/dL — AB (ref 8.9–10.3)
CO2: 22 mmol/L (ref 22–32)
Chloride: 102 mmol/L (ref 101–111)
Creatinine, Ser: 4.92 mg/dL — ABNORMAL HIGH (ref 0.61–1.24)
GFR calc Af Amer: 12 mL/min — ABNORMAL LOW (ref >60)
GFR, EST NON AFRICAN AMERICAN: 10 mL/min — AB (ref >60)
Glucose, Bld: 92 mg/dL (ref 65–99)
POTASSIUM: 3.9 mmol/L (ref 3.5–5.1)
Sodium: 137 mmol/L (ref 135–145)
TOTAL PROTEIN: 5 g/dL — AB (ref 6.5–8.1)

## 2015-04-13 LAB — PROTIME-INR
INR: 1.29 (ref 0.00–1.49)
PROTHROMBIN TIME: 16.2 s — AB (ref 11.6–15.2)

## 2015-04-13 LAB — MAGNESIUM: MAGNESIUM: 2.4 mg/dL (ref 1.7–2.4)

## 2015-04-13 LAB — HEPATITIS B SURFACE ANTIGEN: Hepatitis B Surface Ag: NEGATIVE

## 2015-04-13 MED ORDER — MIDODRINE HCL 5 MG PO TABS
ORAL_TABLET | ORAL | Status: AC
  Filled 2015-04-13: qty 2

## 2015-04-13 MED ORDER — COSYNTROPIN 0.25 MG IJ SOLR
0.2500 mg | Freq: Once | INTRAMUSCULAR | Status: AC
  Administered 2015-04-14: 0.25 mg via INTRAVENOUS
  Filled 2015-04-13 (×2): qty 0.25

## 2015-04-13 MED ORDER — HYDROCORTISONE 5 MG PO TABS: 5.0000 mg | ORAL_TABLET | Freq: Two times a day (BID) | ORAL | Status: DC

## 2015-04-13 MED ORDER — HEPARIN SODIUM (PORCINE) 5000 UNIT/ML IJ SOLN
5000.0000 [IU] | Freq: Three times a day (TID) | INTRAMUSCULAR | Status: DC
  Administered 2015-04-13 – 2015-04-16 (×8): 5000 [IU] via SUBCUTANEOUS
  Filled 2015-04-13 (×8): qty 1

## 2015-04-13 NOTE — Progress Notes (Signed)
[facility] Progress Note  Assessment/Plan: 1. Leukocytosis - blood cx's negative, stress steroids WBC trending down parallels steroid taper 2. ESRD - TTS - outpt - off schedule; Access issues - long term prognosis for AVF poor - this is his first HD - has been on CRRT K 3.9- changed to 4 K 3. Anemia - hgb 10.5- not on ESA yet -follow closely for low dose 4. Secondary hyperparathyroidism - P low -off binders Hectorol 1 - lastiPTH 436 5. Chronic hypotension/volume - tid midodrine 20 mg 6. Nutrition - alb 2.9 -  7. Chronic ascites - periodic paracentesis 8. Right sided CHF with pul HTN - now on sildenafil 9. Afib - Cards rec coumadin  Myriam Jacobson, PA-C [place] [phone] 04/13/2015,8:47 AM  LOS: 10 days   Pt seen, examined and agree w A/P as above. ESRD patient with known severe R heart failure and chronic hypotension. These problems with hypotension have started recently in the last few months according to the patient. He is on maximum dose of midodrine 20 mg tid.  SBP's are in the 60's - 80's, he has been OOB in the room getting up by himself and is asymptomatic.  His prognosis is not great and I've discussed this with him.  He said he has his "affairs in orders", but is not sure if he would want CPR/ etc in case of cardiac arrest; he will think about it.  Had HD today.  Will ask IR to see him for tunneled HD cath.   AVF was put in at [facility]   Kelly Splinter MD [facility] pager [phone]    cell [phone] 04/13/2015, 3:59 PM    Subjective:   Didn't want to stand - pre HD wt 68 - "i'm doing ok"  Objective Filed Vitals:   04/13/15 0757 04/13/15 0808 04/13/15 0822 04/13/15 0830  BP: 78/47 74/46 85/49  91/65  Pulse: 59 65 67 62  Temp:      TempSrc:      Resp:      Height:      Weight:      SpO2:   98%    Physical Exam goal 2 L General: ill appearing sitting up in bed on HD Heart: irreg irreg 2/6 murmur Lungs: dim  BS  Abdomen: soft ascites - greatly improved Extremities: no LE edema; marked upper and lower wasting Dialysis Access: right IJ - trialysis cath and left upper AVF Qb 300 17 guage  Dialysis Orders: [facility] TTS 4.15 180 450/800 EDW 76 2 K 2.25 Ca no profile left lower AVF hectorol 1 heparin 8400 venofer 50 2/16 - last hgb 11.5   Additional Objective Labs: Basic Metabolic Panel:  Recent Labs Lab 04/10/15 1600 04/11/15 0514 04/12/15 0325 04/13/15 0450  NA 137 141 136 137  K 4.2 4.1 4.1 3.9  CL 101 105 101 102  CO2 24 22 24 22   GLUCOSE 129* 84 116* 92  BUN 10 10 27* 43*  CREATININE 1.91* 1.83* 3.66* 4.92*  CALCIUM 8.5* 7.9* 8.1* 7.9*  PHOS 2.5 2.4* 3.4  --    Liver Function Tests:  Recent Labs Lab 04/11/15 0514 04/12/15 0325 04/13/15 0450  AST  --   --  21  ALT  --   --  20  ALKPHOS  --   --  66  BILITOT  --   --  0.7  PROT  --   --  5.0*  ALBUMIN 2.6* 2.5* 2.9*  CBC:  Recent Labs Lab 04/08/15 0425 04/09/15 0350 04/10/15 0453 04/12/15 0325 04/13/15 0450  WBC 51.2* 48.3* 56.8* 47.2* 29.2*  NEUTROABS  --   --  52.8* 43.4* 26.0*  HGB 11.6* 11.8* 12.5* 12.1* 10.5*  HCT 38.7* 40.1 41.4 38.0* 35.3*  MCV 100.3* 101.0* 99.8 100.0 100.0  PLT 234 224 262 202 174   Studies/Results: Nm Pulmonary Perf And Vent  04/12/2015  CLINICAL DATA:  Dyspnea, chronic atrial fibrillation, end-stage renal disease on dialysis, diastolic CHF heart failure, hypotension, volume overload, pulmonary hypertension, question chronic pulmonary embolism EXAM: NUCLEAR MEDICINE VENTILATION - PERFUSION LUNG SCAN TECHNIQUE: Ventilation images were obtained in multiple projections using inhaled aerosol Tc-41m DTPA. Perfusion images were obtained in multiple projections after intravenous injection of Tc-44m MAA. RADIOPHARMACEUTICALS:  [id number] millicuries [id number] DTPA aerosol inhalation and 4.0 millicuries [id number] MAA IV COMPARISON:  None; correlation chest radiograph 04/07/2015, none more recent  FINDINGS: Ventilation: Diffusely diminished ventilation in the upper lobes. Ventilation defects that LEFT lower lobe base and the posterior RIGHT lower lobe. Central airway deposition of tracer. Perfusion: Small subsegmental perfusion defect LEFT upper lobe. Diminished perfusion at the posterior aspect of the RIGHT lower lobe and at the posterior LEFT lower lobe base, sites corresponding to pleural effusions and atelectasis on chest radiograph and matching the ventilatory abnormalities. No additional segmental or subsegmental perfusion defects identified. IMPRESSION: Matching abnormal ventilation and perfusion at the lower lobes corresponding to areas of atelectasis and effusion seen on chest radiography, though the available chest radiograph is 74 days old. Observed perfusion abnormalities are less than expected based on a degree of atelectasis and volumes of pleural effusions identified by radiography. Findings represent a low probability for pulmonary embolism. Electronically Signed   By: Lavonia Dana M.D.   On: 04/12/2015 10:19   Medications:   . antiseptic oral rinse  7 mL Mouth Rinse BID  . doxercalciferol  1 mcg Intravenous Q T,Th,Sa-HD  . feeding supplement (NEPRO CARB STEADY)  237 mL Oral TID BM  . heparin  5,000 Units Subcutaneous 3 times per day  . hydrocortisone sod succinate (SOLU-CORTEF) inj  25 mg Intravenous Daily  . levothyroxine  50 mcg Oral QAC breakfast  . midodrine      . midodrine      . midodrine  20 mg Oral TID WC  . sildenafil  20 mg Oral TID

## 2015-04-13 NOTE — Progress Notes (Signed)
[facility] Progress Note  Patient: Erik Ochoa [id number]   PCP: Raquel James DOB: 14-May-1940   DOA: 04/03/2015   DOS: 04/13/2015   Date of Service: the patient was seen and examined on 04/13/2015  Subjective: The patient denies any complains of dizziness or lightheadedness, no nausea no vomiting no diarrhea no fatigue, confusion. No chest pain. Nutrition: tolerating oral diet Activity: walking in the room Last BM: 04/11/2015  Assessment and Plan: 1. Shock ([place]) Mostly cardiogenic shock Sepsis ruled out Patient's echogram shows 60-65% EF with pulmonary hypertension and RV failure Status post right heart cath, PCWP normal, elevated filling pressure. Moderate pulmonary artery hypertension. VQ scan negative Started on sildenafil. Cardiology following, will continue to be monitored in the stepdown unitUntil his map remained more than 60 consistently. Blood pressure continues to remain lower.  Patient is just off the pressors.  2. ESRD. On hemodialysis nephrology following. Initially was on CRRT  3. Leukocytosis. Essential thrombocytosis. Appears chronic and worsening with stress dose steroids. Improvement in taper We will continue to closely monitor.  4. Ascites. Abnormal LFTs. Ultrasound liver in the morning.  5. 2. Protein calorie malnutrition. Continue supplements.  6. Hypothyroidism. Continue Synthroid.  7. Presumed adrenal insufficiency. Currently on stress dose steroids. We'll continue at present Check cosyntropin stim admission test in the morning  DVT Prophylaxis: subcutaneous Heparin Nutrition: Renal diet Advance goals of care discussion: Full code  Brief Summary of Hospitalization:  HPI: As per the H and P dictated on admission, "75 year old male w/ ESRD. Has been in [place] w/out complaints. Presented to Out-pt vascular center for planned left balloon angioplasty of Left AVF on 2/24. Procedure unremarkable but was hypotensive persistantly  after. No HD since 2/21. In ED found to be hypotensive w/ SBP in 70s. Reported he was in usual health. Did feel "lethargic" but he attributed this to poor sleep. On PE has significant LE erythema and warmth to BLEs. Will admit w/ working dx of sepsis (potential sources LE edema vs possible bacterial shed for infected AVF? ). " Daily update: Transfer to hospitalist 04/12/2015 Procedures: Right heart cath Consultants: Cardiology, nephrology, critical care primary admission Antibiotics: Anti-infectives    Start     Dose/Rate Route Frequency Ordered Stop   04/04/15 1700  vancomycin (VANCOCIN) IVPB 1000 mg/200 mL premix  Status:  Discontinued     1,000 mg 200 mL/hr over 60 Minutes Intravenous Every 24 hours 04/03/15 1725 04/06/15 0956   04/03/15 2200  piperacillin-tazobactam (ZOSYN) IVPB 2.25 g  Status:  Discontinued     2.25 g 100 mL/hr over 30 Minutes Intravenous 3 times per day 04/03/15 1518 04/03/15 1723   04/03/15 1800  piperacillin-tazobactam (ZOSYN) IVPB 3.375 g  Status:  Discontinued     3.375 g 100 mL/hr over 30 Minutes Intravenous 4 times per day 04/03/15 1725 04/11/15 0919   04/03/15 1530  vancomycin (VANCOCIN) 1,500 mg in sodium chloride 0.9 % 500 mL IVPB     1,500 mg 250 mL/hr over 120 Minutes Intravenous  Once 04/03/15 1519 04/03/15 1933   04/03/15 1300  vancomycin (VANCOCIN) IVPB 1000 mg/200 mL premix  Status:  Discontinued     1,000 mg 200 mL/hr over 60 Minutes Intravenous  Once 04/03/15 1255 04/03/15 1518   04/03/15 1300  piperacillin-tazobactam (ZOSYN) IVPB 3.375 g  Status:  Discontinued     3.375 g 100 mL/hr over 30 Minutes Intravenous  Once 04/03/15 1255 04/03/15 1725      Family Communication: no family was present at  bedside, at the time of interview.   Disposition:  Barriers to safe discharge: Improvement in blood pressure   Intake/Output Summary (Last 24 hours) at 04/13/15 1508 Last data filed at 04/13/15 1400  Gross per 24 hour  Intake    900 ml  Output   1299  ml  Net   -399 ml   Filed Weights   04/13/15 0355 04/13/15 0700 04/13/15 1044  Weight: 68.4 kg (150 lb 12.7 oz) 68.4 kg (150 lb 12.7 oz) 67.2 kg (148 lb 2.4 oz)    Objective: Physical Exam: Filed Vitals:   04/13/15 1044 04/13/15 1125 04/13/15 1200 04/13/15 1400  BP: 87/49 82/56  70/55  Pulse: 59  65   Temp: 97.4 F (36.3 C) 98.1 F (36.7 C)    TempSrc: Oral Oral    Resp: 16 16 22 18   Height:      Weight: 67.2 kg (148 lb 2.4 oz)     SpO2: 99% 96% 97% 97%    General: Appear in mild distress, no Rash; Oral Mucosa moist. Cardiovascular: S1 and S2 Present, aortic Murmur, positive JVD Respiratory: Bilateral Air entry present and faint basal Crackles, no wheezes Abdomen: Bowel Sound present, Soft and no tenderness Extremities: no Pedal edema, no calf tenderness Neurology: Grossly no focal neuro deficit.  Data Reviewed: CBC:  Recent Labs Lab 04/08/15 0425 04/09/15 0350 04/10/15 0453 04/12/15 0325 04/13/15 0450  WBC 51.2* 48.3* 56.8* 47.2* 29.2*  NEUTROABS  --   --  52.8* 43.4* 26.0*  HGB 11.6* 11.8* 12.5* 12.1* 10.5*  HCT 38.7* 40.1 41.4 38.0* 35.3*  MCV 100.3* 101.0* 99.8 100.0 100.0  PLT 234 224 262 202 [id number]   Basic Metabolic Panel:  Recent Labs Lab 04/08/15 0425  04/09/15 0350 04/09/15 1843 04/10/15 0453 04/10/15 1600 04/11/15 0514 04/12/15 0325 04/13/15 0450  NA 140  < > 138 138 138 137 141 136 137  K 4.4  < > 4.5 4.4 4.6 4.2 4.1 4.1 3.9  CL 102  < > 105 102 101 101 105 101 102  CO2 27  < > 26 23 24 24 22 24 22   GLUCOSE 110*  < > 110* 154* 117* 129* 84 116* 92  BUN 13  < > 11 11 13 10 10  27* 43*  CREATININE 2.14*  < > 1.90* 1.89* 2.30* 1.91* 1.83* 3.66* 4.92*  CALCIUM 8.4*  < > 8.3* 8.5* 8.6* 8.5* 7.9* 8.1* 7.9*  MG 2.6*  --  2.5*  --  2.5*  --  2.1  --  2.4  PHOS 3.3  < > 2.5 2.6 2.8 2.5 2.4* 3.4  --   < > = values in this interval not displayed. Liver Function Tests:  Recent Labs Lab 04/10/15 0453 04/10/15 1600 04/11/15 0514 04/12/15 0325  04/13/15 0450  AST  --   --   --   --  21  ALT  --   --   --   --  20  ALKPHOS  --   --   --   --  66  BILITOT  --   --   --   --  0.7  PROT  --   --   --   --  5.0*  ALBUMIN 2.9* 2.8* 2.6* 2.5* 2.9*   No results for input(s): LIPASE, AMYLASE in the last 168 hours. No results for input(s): AMMONIA in the last 168 hours.  Cardiac Enzymes: No results for input(s): CKTOTAL, CKMB, CKMBINDEX, TROPONINI in the last 168 hours.  BNP (last 3 results) No results for input(s): BNP in the last 8760 hours.  CBG: No results for input(s): GLUCAP in the last 168 hours.  Recent Results (from the past 240 hour(s))  Culture, blood (Routine X 2) w Reflex to ID Panel     Status: None   Collection Time: 04/03/15  4:15 PM  Result Value Ref Range Status   Specimen Description BLOOD  Final   Special Requests BOTTLES DRAWN AEROBIC AND ANAEROBIC 5CC RJV  Final   Culture NO GROWTH 5 DAYS  Final   Report Status 04/09/2015 FINAL  Final  Culture, blood (Routine X 2) w Reflex to ID Panel     Status: None   Collection Time: 04/03/15  4:20 PM  Result Value Ref Range Status   Specimen Description BLOOD RIGHT HAND  Final   Special Requests BOTTLES DRAWN AEROBIC AND ANAEROBIC 5CC  Final   Culture NO GROWTH 5 DAYS  Final   Report Status 04/09/2015 FINAL  Final  MRSA PCR Screening     Status: None   Collection Time: 04/03/15 10:14 PM  Result Value Ref Range Status   MRSA by PCR NEGATIVE NEGATIVE Final    Comment:        The GeneXpert MRSA Assay (FDA approved for NASAL specimens only), is one component of a comprehensive MRSA colonization surveillance program. It is not intended to diagnose MRSA infection nor to guide or monitor treatment for MRSA infections.   C difficile quick scan w PCR reflex     Status: None   Collection Time: 04/07/15  9:49 AM  Result Value Ref Range Status   C Diff antigen NEGATIVE NEGATIVE Final   C Diff toxin NEGATIVE NEGATIVE Final   C Diff interpretation Negative for  toxigenic C. difficile  Final     Studies: No results found.   Scheduled Meds: . antiseptic oral rinse  7 mL Mouth Rinse BID  . doxercalciferol  1 mcg Intravenous Q T,Th,Sa-HD  . feeding supplement (NEPRO CARB STEADY)  237 mL Oral TID BM  . heparin  5,000 Units Subcutaneous 3 times per day  . [START ON 04/14/2015] hydrocortisone  5 mg Oral BID  . levothyroxine  50 mcg Oral QAC breakfast  . midodrine  20 mg Oral TID WC  . sildenafil  20 mg Oral TID   Continuous Infusions:  PRN Meds: sodium chloride, acetaminophen, alteplase, heparin, heparin, ondansetron (ZOFRAN) IV, pentafluoroprop-tetrafluoroeth, technetium TC 63M diethylenetriame-pentaacetic acid  Time spent: 30 minutes  Author: Berle Mull, MD [facility] Pager: [phone] 04/13/2015 3:08 PM  If 7PM-7AM, please contact night-coverage at [email or web address], password [facility]

## 2015-04-13 NOTE — Progress Notes (Signed)
PULMONARY / CRITICAL CARE MEDICINE   Name: Erik Ochoa MRN: [id number] DOB: 18-Apr-1940    ADMISSION DATE:  04/03/2015 CONSULTATION DATE:  2/24  REFERRING MD:  Sabra Heck   CHIEF COMPLAINT:  Hypotension   BRIEF Hx:   75 year old male with a PMH of HTN, CHF, AF (not on AC), PH, Q2 week paracentesis at the [place] and ESRD who presented 2/24 to an outpatient vascular center for planned left balloon angioplasty of L AVF.  Post procedure had ongoing hypotension and was referred to ED for evaluation.  Admitted with working dx of sepsis (doubt infection).  To ICU for CVVHD in the setting of shock thought related to pulmonary hypertension  Was on levophed.  RHC with normal PCWP but elevated R-sided filling pressures suggestive of RV failure. 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   SUBJECTIVE:   Tolerated HD today. Feels well.  VITAL SIGNS: BP 70/55 mmHg  Pulse 65  Temp(Src) 98.1 F (36.7 C) (Oral)  Resp 18  Ht 6\' 4"  (1.93 m)  Wt 148 lb 2.4 oz (67.2 kg)  BMI 18.04 kg/m2  SpO2 97%  HEMODYNAMICS: CVP:  [7 mmHg-11 mmHg] 7 mmHg  VENTILATOR SETTINGS:    INTAKE / OUTPUT: I/O last 3 completed shifts: In: 1800 [P.O.:960; I.V.:360; NG/GT:480] Out: -   PHYSICAL EXAMINATION: General:  No distress, awake, alert. Neuro:  No focal deficit. HEENT:  PERRL, Moist mucus membranes Cardiovascular: Irregular, No MRG Lungs:  Clear, No wheeze or crackles.  Abdomen: Soft, Non tender, no distended, + bowel sounds Skin:  Intact.  LABS:  BMET  Recent Labs Lab 04/11/15 0514 04/12/15 0325 04/13/15 0450  NA 141 136 137  K 4.1 4.1 3.9  CL 105 101 102  CO2 22 24 22   BUN 10 27* 43*  CREATININE 1.83* 3.66* 4.92*  GLUCOSE 84 116* 92    Electrolytes  Recent Labs Lab 04/10/15 0453 04/10/15 1600 04/11/15 0514 04/12/15 0325 04/13/15 0450  CALCIUM 8.6* 8.5* 7.9* 8.1* 7.9*  MG 2.5*  --  2.1  --  2.4  PHOS 2.8 2.5 2.4* 3.4  --     CBC  Recent  Labs Lab 04/10/15 0453 04/12/15 0325 04/13/15 0450  WBC 56.8* 47.2* 29.2*  HGB 12.5* 12.1* 10.5*  HCT 41.4 38.0* 35.3*  PLT 262 202 174    Coag's  Recent Labs Lab 04/07/15 0422 04/08/15 0425 04/09/15 0350 04/13/15 0450  APTT 33 37 33  --   INR  --   --   --  1.29    Sepsis Markers  Recent Labs Lab 04/09/15 0350 04/09/15 0635 04/10/15 0453 04/11/15 0514  LATICACIDVEN  --  1.1  --   --   PROCALCITON 0.53  --  0.69 0.58    ABG No results for input(s): PHART, PCO2ART, PO2ART in the last 168 hours.  Liver Enzymes  Recent Labs Lab 04/11/15 0514 04/12/15 0325 04/13/15 0450  AST  --   --  21  ALT  --   --  20  ALKPHOS  --   --  66  BILITOT  --   --  0.7  ALBUMIN 2.6* 2.5* 2.9*    Cardiac Enzymes No results for input(s): TROPONINI, PROBNP in the last 168 hours.  Glucose No results for input(s): GLUCAP in the last 168 hours.  Imaging No results found.   STUDIES:  ECHO 02/27 - LV EF 60-65%, D-shaped interventricular septum suggestive of RV pressure/volume overload, severely dilated RV  with severe RV systolic dysfunction, PASP 59 mmHg. Moderate AS, mild MR.  RUE [place] 2/27 >> neg for DVT, no abscess RHC 3/1 >> normal PCWP (9), elevated right sided filling pressures suggestive of R heart failure, moderated PAH (53/17)  CULTURES: BCX2 2/24 >>   ANTIBIOTICS: Vanc 2/24 >> 2/27 Zosyn 2/24 >>  SIGNIFICANT EVENTS: 2/24  Admitted with hypotension, concern for septic shock from Cellulitis.  CRRT initiated.  2/27  Paracentesis completed; 4.5L off 2/28  Trialysis cath partially pulled out by patient; replaced, CRRT continues 3/01  RHC for persistent hypotension, thought related to [facility]  3/4- off cvvhd  LINES/TUBES: Dialysis Catheter R IJ 2/24 >>  DISCUSSION: 75 year old male w/ ESRD. Takes midodrine for hypotension on dialysis days (BP in 90s). Last HD was Tuesday 2/12. Went to out-pt vascular procedure facility on 2/24 for elective balloon angioplasty of L  AVF. Post-procedure developed persistent hypotension w/ room air sats 88% and BP in 70s. No clear source of infection.  Hypotension thought related to pulmonary hypertension.  Marland Kitchen RHC with normal PCWP but elevated R-sided filling pressures suggestive of RV failure. 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 Revatio 20 mg TID started by Card   ASSESSMENT / PLAN:  PULMONARY A: Pleural effusions / pulmonary edema  Pulmonary Hypertension - moderate PAH on RHC P:   Pulmonary hygiene O2 as needed On revatio Repeat CXR to assess pleural effusions.   CARDIOVASCULAR A:  Septic shock - source unclear, low dose levophed continues.  ? Baseline BP readings Chronic Afib - rate controlled. Not on anticoagulation per patient Hypotensive - likely baseline hypotension.  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  P:  Continue tele. Continue midodrine 20 mg tid Revatio as above  RENAL A:   ESRD - on HD, does not make urine AG metabolic acidosis - resolved  Hyperkalemia resolved    P:   On HD now AV fistula not accessible per renal.  GASTROINTESTINAL A:   Frequent paracentesis - at the [place], records indicate ESRD associated ascites ?? Hypoalbuminemia  ? Liver disease > could possibly explain baseline hypotension. No evidence.  Ascites due to RHF P:   Renal diet as tolerated Aspiration precautions.  HEMATOLOGIC A:   Anemia of chronic disease  Leukocytosis -pt reports a congential defect P:  S.Q heparin.   INFECTIOUS A:   R/o septic shock - currently potential sources include LE cellulitis; but also consider transient bacteremia (possibly infected AVF? ) No clear source.  Doubt infectious etiology of hypotension.  Leukocytosis - Afebrile, on stress dose steroids P:   Finished abx. No clear source identified.   ENDOCRINE A:   Hypothyroidism  At Risk Hyperglycemia  in setting of stress dose steroids  Adrenal Insufficiency  P:   Cont synthroid  Continuing to taper steroids.   NEUROLOGIC A:   H/o peripheral neuropathy  P:  Stable.  Marshell Garfinkel MD [facility] Pager [phone] If no answer or after 3pm call: [phone] 04/13/2015, 3:59 PM

## 2015-04-14 ENCOUNTER — Inpatient Hospital Stay (HOSPITAL_COMMUNITY): Payer: Medicare Other

## 2015-04-14 DIAGNOSIS — R188 Other ascites: Secondary | ICD-10-CM | POA: Insufficient documentation

## 2015-04-14 DIAGNOSIS — J9600 Acute respiratory failure, unspecified whether with hypoxia or hypercapnia: Secondary | ICD-10-CM | POA: Insufficient documentation

## 2015-04-14 LAB — CBC
HCT: 35.7 % — ABNORMAL LOW (ref 39.0–52.0)
Hemoglobin: 11.3 g/dL — ABNORMAL LOW (ref 13.0–17.0)
MCH: 31.7 pg (ref 26.0–34.0)
MCHC: 31.7 g/dL (ref 30.0–36.0)
MCV: 100.3 fL — AB (ref 78.0–100.0)
PLATELETS: 183 10*3/uL (ref 150–400)
RBC: 3.56 MIL/uL — ABNORMAL LOW (ref 4.22–5.81)
RDW: 21 % — AB (ref 11.5–15.5)
WBC: 23.8 10*3/uL — ABNORMAL HIGH (ref 4.0–10.5)

## 2015-04-14 LAB — RENAL FUNCTION PANEL
ANION GAP: 14 (ref 5–15)
Albumin: 2.8 g/dL — ABNORMAL LOW (ref 3.5–5.0)
BUN: 47 mg/dL — ABNORMAL HIGH (ref 6–20)
CALCIUM: 7.7 mg/dL — AB (ref 8.9–10.3)
CO2: 22 mmol/L (ref 22–32)
Chloride: 101 mmol/L (ref 101–111)
Creatinine, Ser: 5.26 mg/dL — ABNORMAL HIGH (ref 0.61–1.24)
GFR calc non Af Amer: 10 mL/min — ABNORMAL LOW (ref >60)
GFR, EST AFRICAN AMERICAN: 11 mL/min — AB (ref >60)
Glucose, Bld: 73 mg/dL (ref 65–99)
Phosphorus: 4 mg/dL (ref 2.5–4.6)
Potassium: 4.1 mmol/L (ref 3.5–5.1)
SODIUM: 137 mmol/L (ref 135–145)

## 2015-04-14 LAB — APTT: aPTT: 34 seconds (ref 24–37)

## 2015-04-14 LAB — ACTH STIMULATION, 3 TIME POINTS
Cortisol, 30 Min: 24.4 ug/dL
Cortisol, 60 Min: 26.6 ug/dL
Cortisol, Base: 17.1 ug/dL

## 2015-04-14 LAB — PROTIME-INR
INR: 1.25 (ref 0.00–1.49)
PROTHROMBIN TIME: 15.9 s — AB (ref 11.6–15.2)

## 2015-04-14 NOTE — Progress Notes (Signed)
PULMONARY / CRITICAL CARE MEDICINE   Name: Erik Ochoa MRN: [id number] DOB: 10-11-1940    ADMISSION DATE:  04/03/2015 CONSULTATION DATE:  2/24  REFERRING MD:  Sabra Heck   CHIEF COMPLAINT:  Hypotension   BRIEF Hx:   75 year old male with a PMH of HTN, CHF, AF (not on AC), PH, Q2 week paracentesis at the [place] and ESRD who presented 2/24 to an outpatient vascular center for planned left balloon angioplasty of L AVF.  Post procedure had ongoing hypotension and was referred to ED for evaluation.  Admitted with working dx of sepsis (doubt infection).  To ICU for CVVHD in the setting of shock thought related to pulmonary hypertension  Was on levophed.  RHC with normal PCWP but elevated R-sided filling pressures suggestive of RV failure. 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   SUBJECTIVE:   Sitting in the side of the bed. Feels well.  VITAL SIGNS: BP 92/65 mmHg  Pulse 60  Temp(Src) 97.8 F (36.6 C) (Oral)  Resp 17  Ht 6\' 4"  (1.93 m)  Wt 148 lb 2.4 oz (67.2 kg)  BMI 18.04 kg/m2  SpO2 96%  HEMODYNAMICS: CVP:  [7 mmHg-8 mmHg] 7 mmHg  VENTILATOR SETTINGS:    INTAKE / OUTPUT: I/O last 3 completed shifts: In: [id number] [P.O.:1017; I.V.:130] Out: 1299 [Other:1299]  PHYSICAL EXAMINATION: General:  No distress, awake, alert. Neuro:  No focal deficit. HEENT:  PERRL, Moist mucus membranes Cardiovascular: Irregular, No MRG Lungs:  Clear, No wheeze or crackles.  Abdomen: Soft, Non tender, no distended, + bowel sounds Skin:  Intact.  LABS:  BMET  Recent Labs Lab 04/12/15 0325 04/13/15 0450 04/14/15 0505  NA 136 137 137  K 4.1 3.9 4.1  CL 101 102 101  CO2 24 22 22   BUN 27* 43* 47*  CREATININE 3.66* 4.92* 5.26*  GLUCOSE 116* 92 73    Electrolytes  Recent Labs Lab 04/10/15 0453  04/11/15 0514 04/12/15 0325 04/13/15 0450 04/14/15 0505  CALCIUM 8.6*  < > 7.9* 8.1* 7.9* 7.7*  MG 2.5*  --  2.1  --  2.4  --   PHOS 2.8  < > 2.4* 3.4   --  4.0  < > = values in this interval not displayed.  CBC  Recent Labs Lab 04/12/15 0325 04/13/15 0450 04/14/15 0505  WBC 47.2* 29.2* 23.8*  HGB 12.1* 10.5* 11.3*  HCT 38.0* 35.3* 35.7*  PLT 202 174 183    Coag's  Recent Labs Lab 04/08/15 0425 04/09/15 0350 04/13/15 0450 04/14/15 0505  APTT 37 33  --  34  INR  --   --  1.29 1.25    Sepsis Markers  Recent Labs Lab 04/09/15 0350 04/09/15 0635 04/10/15 0453 04/11/15 0514  LATICACIDVEN  --  1.1  --   --   PROCALCITON 0.53  --  0.69 0.58    ABG No results for input(s): PHART, PCO2ART, PO2ART in the last 168 hours.  Liver Enzymes  Recent Labs Lab 04/12/15 0325 04/13/15 0450 04/14/15 0505  AST  --  21  --   ALT  --  20  --   ALKPHOS  --  66  --   BILITOT  --  0.7  --   ALBUMIN 2.5* 2.9* 2.8*    Cardiac Enzymes No results for input(s): TROPONINI, PROBNP in the last 168 hours.  Glucose No results for input(s): GLUCAP in the last 168 hours.  Imaging Dg Chest [facility] 1 [address]  04/14/2015  CLINICAL DATA:  Acute respiratory failure, shortness of breath, sepsis, end-stage renal disease, acute pulmonary edema, cirrhosis. EXAM: PORTABLE CHEST 1 VIEW COMPARISON:  Portable chest x-ray of April 07, 2015 FINDINGS: The lungs remain mildly hypoinflated. Small to moderate-sized bilateral pleural effusions persist. The cardiac silhouette remains enlarged. The central pulmonary vascularity is prominent. There is no significant cephalization of the vascular pattern. The right internal jugular catheter tip projects over the midportion of the SVC. IMPRESSION: Stable hypoinflation with smaller moderate-sized bilateral pleural effusions. Cardiomegaly without frank pulmonary edema. Electronically Signed   By: David  Martinique M.D.   On: 04/14/2015 07:10     STUDIES:  ECHO 02/27 - LV EF 60-65%, D-shaped interventricular septum suggestive of RV pressure/volume overload, severely dilated RV with severe RV systolic dysfunction, PASP 59  mmHg. Moderate AS, mild MR.  RUE [place] 2/27 >> neg for DVT, no abscess RHC 3/1 >> normal PCWP (9), elevated right sided filling pressures suggestive of R heart failure, moderated PAH (53/17)  CULTURES: BCX2 2/24 >>   ANTIBIOTICS: Vanc 2/24 >> 2/27 Zosyn 2/24 >>  SIGNIFICANT EVENTS: 2/24  Admitted with hypotension, concern for septic shock from Cellulitis.  CRRT initiated.  2/27  Paracentesis completed; 4.5L off 2/28  Trialysis cath partially pulled out by patient; replaced, CRRT continues 3/01  RHC for persistent hypotension, thought related to [facility]  3/4- off cvvhd  LINES/TUBES: Dialysis Catheter R IJ 2/24 >>  DISCUSSION: 75 year old male w/ ESRD. Takes midodrine for hypotension on dialysis days (BP in 90s). Last HD was Tuesday 2/12. Went to out-pt vascular procedure facility on 2/24 for elective balloon angioplasty of L AVF. Post-procedure developed persistent hypotension w/ room air sats 88% and BP in 70s. No clear source of infection.  Hypotension thought related to pulmonary hypertension.  Marland Kitchen RHC with normal PCWP but elevated R-sided filling pressures suggestive of RV failure. 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 Revatio 20 mg TID started by Card   ASSESSMENT / PLAN:  PULMONARY A: Pleural effusions / pulmonary edema  Pulmonary Hypertension - moderate PAH on RHC P:   Pulmonary hygiene O2 as needed On revatio Repeat CXR shows stable B/L effusion  CARDIOVASCULAR A:  Septic shock - source unclear, low dose levophed continues.  ? Baseline BP readings Chronic Afib - rate controlled. Not on anticoagulation per patient Hypotensive - likely baseline hypotension.  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  P:  Continue tele. Continue midodrine 20 mg tid Revatio as above  RENAL A:   ESRD - on HD, does not make urine AG metabolic acidosis - resolved   Hyperkalemia resolved    P:   On HD now AV fistula not accessible per renal.  GASTROINTESTINAL A:   Frequent paracentesis - at the [place], records indicate ESRD associated ascites ?? Hypoalbuminemia  ? Liver disease > could possibly explain baseline hypotension. No evidence.  Ascites due to RHF P:   Renal diet as tolerated Aspiration precautions.  HEMATOLOGIC A:   Anemia of chronic disease  Leukocytosis -pt reports a congential defect P:  S.Q heparin.   INFECTIOUS A:   R/o septic shock - currently potential sources include LE cellulitis; but also consider transient bacteremia (possibly infected AVF? ) No clear source.  Doubt infectious etiology of hypotension.  Leukocytosis - Afebrile, on stress dose steroids P:   Finished abx. No clear source identified.  ENDOCRINE A:   Hypothyroidism  At Risk Hyperglycemia in setting of stress dose steroids  Adrenal Insufficiency  P:   Cont synthroid  Continuing to taper steroids.   NEUROLOGIC A:   H/o peripheral neuropathy  P:  Stable.  PCCM will sign off. Please call back with any questions.  Marshell Garfinkel MD [facility] Pager [phone] If no answer or after 3pm call: [phone] 04/14/2015, 9:28 AM

## 2015-04-14 NOTE — Care Management Important Message (Signed)
Important Message  Patient Details  Name: Erik Ochoa MRN: [id number] Date of Birth: September 01, 1940   Medicare Important Message Given:  Yes    Chimaobi Casebolt, Leroy Sea 04/14/2015, 8:20 AM

## 2015-04-14 NOTE — Evaluation (Signed)
Physical Therapy Evaluation Patient Details Name: Erik Ochoa MRN: [id number] DOB: [birth year] Today's Date: 04/14/2015   History of Present Illness  75 year old male with a PMH of HTN, CHF, AF (not on AC), PH, Q2 week paracentesis at the [place] and ESRD who presented 2/24 to an outpatient vascular center for planned left balloon angioplasty of L AVF. Post procedure had ongoing hypotension and was referred to ED for evaluation. Admitted with working dx of sepsis (doubt infection). To ICU for CVVHD in the setting of shock thought related to pulmonary hypertension Was on levophed. RHC with normal PCWP but elevated R-sided filling pressures suggestive of RV failure. Moderate PAH and low cardiac output likely due to RV failure primarily.Pt admitted for hypotension.  Clinical Impression  Pt admitted for above. Pt presenting with generalized weakness, impaired balance, and decreased endurance. Pt also hypotensive. Pt was functioning independently with no AD or assist for ADLs. Pt now requires assist for all mobility. If patient can progress to supervision level pt will be safe for d/c home with roommate however if patient doesn't progress he will need ST-SNF to address above deficits to return to baseline.    Follow Up Recommendations Home health PT;Supervision/Assistance - 24 hour    Equipment Recommendations  Rolling walker with 5" wheels (shower seat)    Recommendations for Other Services       Precautions / Restrictions Precautions Precautions: Fall Precaution Comments: low BP Restrictions Weight Bearing Restrictions: No      Mobility  Bed Mobility Overal bed mobility: Needs Assistance Bed Mobility: Supine to Sit     Supine to sit: Supervision     General bed mobility comments: v/c's to reach across bodywith R hand to use rail to assist self up to EOB, increased time  Transfers Overall transfer level: Needs assistance Equipment used: 1 person hand held  assist Transfers: Sit to/from Stand Sit to Stand: Min assist (mod/maxA from lower surface (toliet))         General transfer comment: assist for initial anterior weight-shift and to clear bottom  Ambulation/Gait Ambulation/Gait assistance: Min assist Ambulation Distance (Feet): 20 Feet (x1, 10'x1) Assistive device: 1 person hand held assist Gait Pattern/deviations: Step-through pattern;Decreased stride length;Wide base of support Gait velocity: slow Gait velocity interpretation: Below normal speed for age/gender General Gait Details: wide base of support with decreased step height, pt reaching for furniture. Pt to benefit from RW to improve stability and safety with amb  Stairs            Wheelchair Mobility    Modified Rankin (Stroke Patients Only)       Balance Overall balance assessment: Needs assistance Sitting-balance support: Feet supported;No upper extremity supported Sitting balance-Leahy Scale: Fair     Standing balance support: Single extremity supported Standing balance-Leahy Scale: Fair Standing balance comment: pt stood at sink to wash hands s/p having BM                             Pertinent Vitals/Pain Pain Assessment: 0-10 Pain Score: 5  Pain Location: bilat feet Pain Descriptors / Indicators: Sore Pain Intervention(s): Monitored during session    Home Living Family/patient expects to be discharged to:: Private residence Living Arrangements:  (roomate) Available Help at Discharge: Friend(s);Available 24 hours/day Type of Home: House Home Access: Stairs to enter Entrance Stairs-Rails: None Entrance Stairs-Number of Steps: 1 Home Layout: One level Home Equipment: Walker - 2 wheels  Prior Function Level of Independence: Independent         Comments: wasn't using a RW for amb, drove, did grocery shopping     Hand Dominance   Dominant Hand: Right    Extremity/Trunk Assessment   Upper Extremity Assessment:  Generalized weakness           Lower Extremity Assessment: Generalized weakness      Cervical / Trunk Assessment: Normal  Communication   Communication: No difficulties  Cognition Arousal/Alertness: Awake/alert Behavior During Therapy: Flat affect Overall Cognitive Status: Within Functional Limits for tasks assessed                      General Comments General comments (skin integrity, edema, etc.): BP in supine 87/54, sitting 97/60    Exercises        Assessment/Plan    PT Assessment Patient needs continued PT services  PT Diagnosis Difficulty walking;Generalized weakness   PT Problem List Decreased strength;Decreased activity tolerance;Decreased balance;Decreased coordination;Decreased knowledge of use of DME  PT Treatment Interventions DME instruction;Gait training;Functional mobility training;Therapeutic activities;Therapeutic exercise;Stair training;Balance training   PT Goals (Current goals can be found in the Care Plan section) Acute Rehab PT Goals Patient Stated Goal: to get stronger PT Goal Formulation: With patient Time For Goal Achievement: 04/21/15 Potential to Achieve Goals: Good    Frequency Min 3X/week   Barriers to discharge        Co-evaluation               End of Session Equipment Utilized During Treatment: Gait belt Activity Tolerance: Patient limited by fatigue Patient left: in chair;with call bell/phone within reach Nurse Communication: Mobility status         Time: 0730-0800 PT Time Calculation (min) (ACUTE ONLY): 30 min   Charges:   PT Evaluation $PT Eval Low Complexity: 1 Procedure PT Treatments $Gait Training: 8-22 mins   PT G CodesKingsley Callander 04/14/2015, 8:18 AM   Kittie Plater, PT, DPT Pager #: [phone] Office #: [phone]

## 2015-04-14 NOTE — Progress Notes (Addendum)
[facility] Progress Note  Patient: Erik Ochoa [id number]   PCP: Raquel James DOB: May 13, 1940   DOA: 04/03/2015   DOS: 04/14/2015   Date of Service: the patient was seen and examined on 04/14/2015  Subjective: Patient continues to deny any active complaint. No shortness of breath and nausea and vomiting.  Nutrition: Tolerating oral diet Activity: Walking in the room Last BM: 04/14/2015  Brief hospital course: Patient was admitted on 04/03/2015, with complaint of hypotension while at the outpatient clinic. Admitted by critical care suspected shock from sepsis, CRRT was initiated with [facility] placement on 04/03/2015,  underwent paracentesis 04/06/2015, and right heart cath on 04/08/2015 found to be having pulmonary hypertension, CRRT completed on 04/11/2015, off of norepinephrine on 04/10/2015 Currently further plan is continue hemodialysis for volume overload.   Assessment and Plan: 1. Shock ([place]) Mostly cardiogenic shock Sepsis ruled out Patient's echogram shows 60-65% EF with pulmonary hypertension and RV failure Status post right heart cath, PCWP normal, elevated filling pressure. Moderate pulmonary artery hypertension. VQ scan negative, Started on sildenafil. Cardiology following. Blood pressure improving with map more than 60 consistently and therefore the patient will transfer to telemetry, only patient's baseline blood pressure since he remains asymptomatic for more than 72 hours.  2. ESRD. AV fistula malfunction. On hemodialysis nephrology following. Initially was on CRRT with hemodialysis catheter. Currently hematemesis catheter will be removed today. Nephrology and interventional radiology with the discussion regarding further workup for the AV fistula  3. Leukocytosis. Essential thrombocytosis. Appears chronic and improving with stress dose steroids taper We will continue to closely monitor.  4. Ascites. Abnormal LFTs. Ultrasound shows moderate ascites.  May need repeat paracentesis prior to discharge.  5. Protein calorie malnutrition. Continue supplements.  6. Hypothyroidism. Continue Synthroid.  7. Presumed adrenal insufficiency. Initially was on stress dose steroids. Cosyntropin stim relation shows adequate response. No further steroids needed  DVT Prophylaxis: subcutaneous Heparin Nutrition: Renal diet Advance goals of care discussion: full code  Brief Summary of Hospitalization:  HPI: As per the H and P dictated on admission, "75 year old male w/ ESRD. Has been in [place] w/out complaints. Presented to Out-pt vascular center for planned left balloon angioplasty of Left AVF on 2/24. Procedure unremarkable but was hypotensive persistantly after. No HD since 2/21. In ED found to be hypotensive w/ SBP in 70s. Reported he was in usual health. Did feel "lethargic" but he attributed this to poor sleep. On PE has significant LE erythema and warmth to BLEs. Will admit w/ working dx of sepsis (potential sources LE edema vs possible bacterial shed for infected AVF? ). " Procedures: Ultrasound paracentesis by critical care on admission-NO labs were sent Consultants: Primary admission to critical care, cardiology, interventional radiology, nephrology Antibiotics: Anti-infectives    Start     Dose/Rate Route Frequency Ordered Stop   04/04/15 1700  vancomycin (VANCOCIN) IVPB 1000 mg/200 mL premix  Status:  Discontinued     1,000 mg 200 mL/hr over 60 Minutes Intravenous Every 24 hours 04/03/15 1725 04/06/15 0956   04/03/15 2200  piperacillin-tazobactam (ZOSYN) IVPB 2.25 g  Status:  Discontinued     2.25 g 100 mL/hr over 30 Minutes Intravenous 3 times per day 04/03/15 1518 04/03/15 1723   04/03/15 1800  piperacillin-tazobactam (ZOSYN) IVPB 3.375 g  Status:  Discontinued     3.375 g 100 mL/hr over 30 Minutes Intravenous 4 times per day 04/03/15 1725 04/11/15 0919   04/03/15 1530  vancomycin (VANCOCIN) 1,500 mg in sodium chloride 0.9 %  500 mL IVPB       1,500 mg 250 mL/hr over 120 Minutes Intravenous  Once 04/03/15 1519 04/03/15 1933   04/03/15 1300  vancomycin (VANCOCIN) IVPB 1000 mg/200 mL premix  Status:  Discontinued     1,000 mg 200 mL/hr over 60 Minutes Intravenous  Once 04/03/15 1255 04/03/15 1518   04/03/15 1300  piperacillin-tazobactam (ZOSYN) IVPB 3.375 g  Status:  Discontinued     3.375 g 100 mL/hr over 30 Minutes Intravenous  Once 04/03/15 1255 04/03/15 1725      Family Communication: NO family was present at bedside, at the time of interview.   Disposition:  Expected discharge date: 04/16/2015 Barriers to safe discharge: Improvement volume status   Intake/Output Summary (Last 24 hours) at 04/14/15 1537 Last data filed at 04/14/15 1200  Gross per 24 hour  Intake    897 ml  Output      0 ml  Net    897 ml   Filed Weights   04/13/15 0700 04/13/15 1044 04/14/15 1200  Weight: 68.4 kg (150 lb 12.7 oz) 67.2 kg (148 lb 2.4 oz) 67.1 kg (147 lb 14.9 oz)    Objective: Physical Exam: Filed Vitals:   04/14/15 0900 04/14/15 1000 04/14/15 1200 04/14/15 1213  BP: 84/54 84/49 95/62    Pulse:      Temp:    97.8 F (36.6 C)  TempSrc:    Oral  Resp: 18 21    Height:      Weight:   67.1 kg (147 lb 14.9 oz)   SpO2:         General: Appear in moderate distress, no Rash; Oral Mucosa moist. Cardiovascular: S1 and S2 Present, no Murmur, positive JVD Respiratory: Bilateral Air entry present and Clear to Auscultation, no Crackles, no wheezes Abdomen: Bowel Sound present, Soft and distended no tenderness Extremities: no Pedal edema, no calf tenderness Neurology: Grossly no focal neuro deficit.  Data Reviewed: CBC:  Recent Labs Lab 04/09/15 0350 04/10/15 0453 04/12/15 0325 04/13/15 0450 04/14/15 0505  WBC 48.3* 56.8* 47.2* 29.2* 23.8*  NEUTROABS  --  52.8* 43.4* 26.0*  --   HGB 11.8* 12.5* 12.1* 10.5* 11.3*  HCT 40.1 41.4 38.0* 35.3* 35.7*  MCV 101.0* 99.8 100.0 100.0 100.3*  PLT 224 262 202 174 [id number]   Basic  Metabolic Panel:  Recent Labs Lab 04/08/15 0425  04/09/15 0350  04/10/15 0453 04/10/15 1600 04/11/15 0514 04/12/15 0325 04/13/15 0450 04/14/15 0505  NA 140  < > 138  < > 138 137 141 136 137 137  K 4.4  < > 4.5  < > 4.6 4.2 4.1 4.1 3.9 4.1  CL 102  < > 105  < > 101 101 105 101 102 101  CO2 27  < > 26  < > 24 24 22 24 22 22   GLUCOSE 110*  < > 110*  < > 117* 129* 84 116* 92 73  BUN 13  < > 11  < > 13 10 10  27* 43* 47*  CREATININE 2.14*  < > 1.90*  < > 2.30* 1.91* 1.83* 3.66* 4.92* 5.26*  CALCIUM 8.4*  < > 8.3*  < > 8.6* 8.5* 7.9* 8.1* 7.9* 7.7*  MG 2.6*  --  2.5*  --  2.5*  --  2.1  --  2.4  --   PHOS 3.3  < > 2.5  < > 2.8 2.5 2.4* 3.4  --  4.0  < > = values in this interval not displayed.  Liver Function Tests:  Recent Labs Lab 04/10/15 1600 04/11/15 0514 04/12/15 0325 04/13/15 0450 04/14/15 0505  AST  --   --   --  21  --   ALT  --   --   --  20  --   ALKPHOS  --   --   --  66  --   BILITOT  --   --   --  0.7  --   PROT  --   --   --  5.0*  --   ALBUMIN 2.8* 2.6* 2.5* 2.9* 2.8*   No results for input(s): LIPASE, AMYLASE in the last 168 hours. No results for input(s): AMMONIA in the last 168 hours.  Cardiac Enzymes: No results for input(s): CKTOTAL, CKMB, CKMBINDEX, TROPONINI in the last 168 hours.  BNP (last 3 results) No results for input(s): BNP in the last 8760 hours.  CBG: No results for input(s): GLUCAP in the last 168 hours.  Recent Results (from the past 240 hour(s))  C difficile quick scan w PCR reflex     Status: None   Collection Time: 04/07/15  9:49 AM  Result Value Ref Range Status   C Diff antigen NEGATIVE NEGATIVE Final   C Diff toxin NEGATIVE NEGATIVE Final   C Diff interpretation Negative for toxigenic C. difficile  Final     Studies: US Abdomen Limited  04/14/2015  CLINICAL DATA:  Assess for ascites EXAM: LIMITED ABDOMEN ULTRASOUND FOR ASCITES TECHNIQUE: Limited ultrasound survey for ascites was performed in all four abdominal quadrants.  COMPARISON:  None. FINDINGS: There is moderate ascites throughout abdomen. IMPRESSION: Moderate ascites throughout abdomen. Electronically Signed   By: Abelardo Diesel M.D.   On: 04/14/2015 11:15   Dg Chest Port 1 View  04/14/2015  CLINICAL DATA:  Acute respiratory failure, shortness of breath, sepsis, end-stage renal disease, acute pulmonary edema, cirrhosis. EXAM: PORTABLE CHEST 1 VIEW COMPARISON:  Portable chest x-ray of April 07, 2015 FINDINGS: The lungs remain mildly hypoinflated. Small to moderate-sized bilateral pleural effusions persist. The cardiac silhouette remains enlarged. The central pulmonary vascularity is prominent. There is no significant cephalization of the vascular pattern. The right internal jugular catheter tip projects over the midportion of the SVC. IMPRESSION: Stable hypoinflation with smaller moderate-sized bilateral pleural effusions. Cardiomegaly without frank pulmonary edema. Electronically Signed   By: David  Martinique M.D.   On: 04/14/2015 07:10     Scheduled Meds: . antiseptic oral rinse  7 mL Mouth Rinse BID  . doxercalciferol  1 mcg Intravenous Q T,Th,Sa-HD  . feeding supplement (NEPRO CARB STEADY)  237 mL Oral TID BM  . heparin  5,000 Units Subcutaneous 3 times per day  . levothyroxine  50 mcg Oral QAC breakfast  . midodrine  20 mg Oral TID WC  . sildenafil  20 mg Oral TID   Continuous Infusions:  PRN Meds: sodium chloride, acetaminophen, ondansetron (ZOFRAN) IV, technetium TC 85M diethylenetriame-pentaacetic acid  Time spent: 30 minutes  Author: Berle Mull, MD [facility] Pager: [phone] 04/14/2015 3:37 PM  If 7PM-7AM, please contact night-coverage at [email or web address], password [facility]

## 2015-04-14 NOTE — Progress Notes (Signed)
Speech Language Pathology Dysphagia Treatment Patient Details Name: Erik Ochoa MRN: [id number] DOB: Feb 01, 1941 Today's Date: 04/14/2015 Time: 5732-2025 SLP Time Calculation (min) (ACUTE ONLY): 19 min  Assessment / Plan / Recommendation Clinical Impression    Pt able to recall esophageal precautions (upright during meals and at least 30 minutes after, take small sips/bites, alternate solids and liquids) independently. No evidence of oropharyngeal dysphagia and no overt s/s of penetration/aspiration observed. Pt reports able to tolerate PO without coughing episodes within last few days. Pt educated re: diet recommendation of regular texture, thin liquids (straws allowed), meds whole with liquid and esophageal precautions. No SLP f/u recommended at this time.    Diet Recommendation    Regular diet, thin liquids (straws allowed), meds whole with liquid   SLP Plan All goals met;Discharge SLP treatment due to (comment)      Swallowing Goals     General Behavior/Cognition: Alert;Cooperative;Pleasant mood Patient Positioning: Upright in chair/Tumbleform Oral care provided: N/A HPI: 75 year old male w/ ESRD. Has been in [place] w/out complaints. Presented to Out-pt vascular center for planned left balloon angioplasty of Left AVF on 2/24. Procedure unremarkable but was hypotensive persistently after. Found to have pulmonary HTN. Is on CVVHD. RN reports he coughs during meals.   Oral Cavity - Oral Hygiene     Dysphagia Treatment Treatment Methods: Skilled observation;Compensation strategy training;Patient/caregiver education Patient observed directly with PO's: Yes Type of PO's observed: Regular;Dysphagia 1 (puree);Thin liquids Feeding: Able to feed self Liquids provided via: Cup;Straw Oral Phase Signs & Symptoms:  (none) Pharyngeal Phase Signs & Symptoms:  (none) Type of cueing: Verbal Amount of cueing: Minimal   GO     Erik Ochoa 04/14/2015, 2:07 PM    Erik Ochoa,  Student-SLP

## 2015-04-14 NOTE — Progress Notes (Signed)
SLP Cancellation Note  Patient Details Name: Jencarlo Alessandrini MRN: [id number] DOB: 1940/08/10   Cancelled treatment:        Pt NPO for procedure. SLP will continue efforts for swallow assessment.   Titus Mould 04/14/2015, 8:14 AM

## 2015-04-14 NOTE — Progress Notes (Signed)
Patient ID: Erik Ochoa, male   DOB: [birth year], 75 y.o.   MRN: [id number] Asked to see patient for possible declot of his AVF or conversion of temp to perm cath.  Patient says that AVF was used yesterday for HD and that his temp cath was not.  I spoke to Dr. Jonnie Finner who will look into this and let us know if anything further actions are required.    Erik Ochoa E 9:46 AM 04/14/2015

## 2015-04-14 NOTE — Progress Notes (Addendum)
Erik Ochoa Progress Note  Assessment: 1. Leukocytosis - blood cx's negative, stress steroids WBC trending down parallels steroid taper. Cellulitis of lower legs on admission.  2. ESRD - TTS. AVF placed at [facility]. Recent angioplasty at [facility]. Used w 48 ga yesterday OK, no issues.  Will dc temp HD cath. 3. Anemia - hgb 10.5- not on ESA yet -follow closely for low dose 4. Secondary hyperparathyroidism - P low -off binders Hectorol 1 - lastiPTH 436 5. Chronic hypotension - tid midodrine 20 mg 6. Nutrition - alb 2.9 -  7. Chronic ascites - periodic paracentesis 8. Right sided CHF with pul HTN - now on sildenafil 9. Afib - Cards rec coumadin 10. Volume - is 9kg under prior dry wt, no vol on exam. 96% on RA. Let vol come up a bit   Plan - HD tomorrow off sched, keep even  Kelly Splinter MD [place] pager [phone]    cell [phone] 04/14/2015, 10:03 AM    Subjective:   AVF used for HD yesterday w/o difficulty  Objective Filed Vitals:   04/14/15 0500 04/14/15 0600 04/14/15 0800 04/14/15 0900  BP: 94/60 92/65 99/61  84/54  Pulse:      Temp:      TempSrc:      Resp: 19 17 21 18   Height:      Weight:      SpO2:       Physical Exam goal 2 L General: ill appearing sitting up in bed on HD Heart: irreg irreg 2/6 murmur Lungs: dim BS  Abdomen: soft ascites - greatly improved Extremities: no LE edema; marked upper and lower wasting Dialysis Access: right IJ - trialysis cath and left upper AVF Qb 300 17 guage  Dialysis Orders: [facility] TTS 4.15 180 450/800 EDW 76 2 K 2.25 Ca no profile left lower AVF heparin 8400 hectorol 1  venofer 50 2/16 - last hgb 11.5   Additional Objective Labs: Basic Metabolic Panel:  Recent Labs Lab 04/11/15 0514 04/12/15 0325 04/13/15 0450 04/14/15 0505  NA 141 136 137 137  K 4.1 4.1 3.9 4.1  CL 105 101 102 101  CO2 22 24 22 22   GLUCOSE 84 116* 92 73  BUN 10 27* 43* 47*  CREATININE 1.83* 3.66* 4.92* 5.26*   CALCIUM 7.9* 8.1* 7.9* 7.7*  PHOS 2.4* 3.4  --  4.0   Liver Function Tests:  Recent Labs Lab 04/12/15 0325 04/13/15 0450 04/14/15 0505  AST  --  21  --   ALT  --  20  --   ALKPHOS  --  66  --   BILITOT  --  0.7  --   PROT  --  5.0*  --   ALBUMIN 2.5* 2.9* 2.8*   CBC:  Recent Labs Lab 04/09/15 0350 04/10/15 0453 04/12/15 0325 04/13/15 0450 04/14/15 0505  WBC 48.3* 56.8* 47.2* 29.2* 23.8*  NEUTROABS  --  52.8* 43.4* 26.0*  --   HGB 11.8* 12.5* 12.1* 10.5* 11.3*  HCT 40.1 41.4 38.0* 35.3* 35.7*  MCV 101.0* 99.8 100.0 100.0 100.3*  PLT 224 262 202 174 183   Studies/Results: Nm Pulmonary Perf And Vent  04/12/2015  CLINICAL DATA:  Dyspnea, chronic atrial fibrillation, end-stage renal disease on dialysis, diastolic CHF heart failure, hypotension, volume overload, pulmonary hypertension, question chronic pulmonary embolism EXAM: NUCLEAR MEDICINE VENTILATION - PERFUSION LUNG SCAN TECHNIQUE: Ventilation images were obtained in multiple projections using inhaled aerosol Tc-71m DTPA. Perfusion images were obtained in multiple projections  after intravenous injection of Tc-37m MAA. RADIOPHARMACEUTICALS:  [id number] millicuries [id number] DTPA aerosol inhalation and 4.0 millicuries [id number] MAA IV COMPARISON:  None; correlation chest radiograph 04/07/2015, none more recent FINDINGS: Ventilation: Diffusely diminished ventilation in the upper lobes. Ventilation defects that LEFT lower lobe base and the posterior RIGHT lower lobe. Central airway deposition of tracer. Perfusion: Small subsegmental perfusion defect LEFT upper lobe. Diminished perfusion at the posterior aspect of the RIGHT lower lobe and at the posterior LEFT lower lobe base, sites corresponding to pleural effusions and atelectasis on chest radiograph and matching the ventilatory abnormalities. No additional segmental or subsegmental perfusion defects identified. IMPRESSION: Matching abnormal ventilation and perfusion at the lower  lobes corresponding to areas of atelectasis and effusion seen on chest radiography, though the available chest radiograph is 56 days old. Observed perfusion abnormalities are less than expected based on a degree of atelectasis and volumes of pleural effusions identified by radiography. Findings represent a low probability for pulmonary embolism. Electronically Signed   By: Lavonia Dana M.D.   On: 04/12/2015 10:19   Dg Chest Port 1 View  04/14/2015  CLINICAL DATA:  Acute respiratory failure, shortness of breath, sepsis, end-stage renal disease, acute pulmonary edema, cirrhosis. EXAM: PORTABLE CHEST 1 VIEW COMPARISON:  Portable chest x-ray of April 07, 2015 FINDINGS: The lungs remain mildly hypoinflated. Small to moderate-sized bilateral pleural effusions persist. The cardiac silhouette remains enlarged. The central pulmonary vascularity is prominent. There is no significant cephalization of the vascular pattern. The right internal jugular catheter tip projects over the midportion of the SVC. IMPRESSION: Stable hypoinflation with smaller moderate-sized bilateral pleural effusions. Cardiomegaly without frank pulmonary edema. Electronically Signed   By: David  Martinique M.D.   On: 04/14/2015 07:10   Medications:   . antiseptic oral rinse  7 mL Mouth Rinse BID  . doxercalciferol  1 mcg Intravenous Q T,Th,Sa-HD  . feeding supplement (NEPRO CARB STEADY)  237 mL Oral TID BM  . heparin  5,000 Units Subcutaneous 3 times per day  . levothyroxine  50 mcg Oral QAC breakfast  . midodrine  20 mg Oral TID WC  . sildenafil  20 mg Oral TID

## 2015-04-14 NOTE — Progress Notes (Signed)
Admission note:  Arrival Method: arrived from [facility] Mental Orientation: [place] X 4 Telemetry: placed on tele, ccmd notified Assessment: see flowsheet Skin: warm and dry see flowheet Pain: no complaints of pain : Patiet has been oriented to the unit, staff and to the room.call light within reach, bed at lowest position will continue to monitor

## 2015-04-14 NOTE — Progress Notes (Signed)
Nutrition Follow-up  DOCUMENTATION CODES:   Severe malnutrition in context of chronic illness, Underweight  INTERVENTION:   Nepro Shake po TID, each supplement provides 425 kcal and 19 grams protein  NUTRITION DIAGNOSIS:   Malnutrition related to chronic illness as evidenced by severe depletion of body fat, severe depletion of muscle mass. Ongoing.   GOAL:   Patient will meet greater than or equal to 90% of their needs Met.   MONITOR:   PO intake, Supplement acceptance, Weight trends, Labs, I & O's, Skin  ASSESSMENT:   75 year old male with a PMH of HTN, CHF, AF (not on AC), PH, Q2 week paracentesis at the [place] and ESRD who presented 2/24 to an outpatient vascular center for planned left balloon angioplasty of L AVF. Post procedure had ongoing hypotension and was referred to ED for evaluation. Admitted with working dx of sepsis (doubt infection). To ICU for CVVHD in the setting of shock thought related to pulmonary hypertension Was on levophed. RHC with normal PCWP but elevated R-sided filling pressures suggestive of RV failure. 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  Per pt eating most of his meals and drinking 3 Nepro shakes per day.  Weight trending down with fluid removal. Periodic paracentesis.   Diet Order:  Diet NPO time specified Except for: Sips with Meds  Skin:  Wound (see comment) (PU on right foot, and wound on shoulder)  Last BM:  3/7  Height:   Ht Readings from Last 1 Encounters:  04/05/15 '6\' 4"'  (1.93 m)    Weight:   Wt Readings from Last 1 Encounters:  04/14/15 147 lb 14.9 oz (67.1 kg)    Ideal Body Weight:  91.8 kg  BMI:  Body mass index is 18.01 kg/(m^2).  Estimated Nutritional Needs:   Kcal:  1950-2150  Protein:  95-105 grams  Fluid:  1.2 L/day  EDUCATION NEEDS:   No education needs identified at this time  [place], [place], [place] Pager [phone] After Hours Pager

## 2015-04-15 ENCOUNTER — Encounter (HOSPITAL_COMMUNITY): Payer: Self-pay | Admitting: Nephrology

## 2015-04-15 DIAGNOSIS — E43 Unspecified severe protein-calorie malnutrition: Secondary | ICD-10-CM

## 2015-04-15 DIAGNOSIS — D72829 Elevated white blood cell count, unspecified: Secondary | ICD-10-CM

## 2015-04-15 DIAGNOSIS — E039 Hypothyroidism, unspecified: Secondary | ICD-10-CM

## 2015-04-15 LAB — RENAL FUNCTION PANEL
ANION GAP: 12 (ref 5–15)
Albumin: 2.7 g/dL — ABNORMAL LOW (ref 3.5–5.0)
Albumin: 2.7 g/dL — ABNORMAL LOW (ref 3.5–5.0)
Anion gap: 11 (ref 5–15)
BUN: 59 mg/dL — ABNORMAL HIGH (ref 6–20)
BUN: 59 mg/dL — AB (ref 6–20)
CALCIUM: 7.7 mg/dL — AB (ref 8.9–10.3)
CALCIUM: 7.6 mg/dL — AB (ref 8.9–10.3)
CHLORIDE: 102 mmol/L (ref 101–111)
CO2: 23 mmol/L (ref 22–32)
CO2: 22 mmol/L (ref 22–32)
CREATININE: 6.29 mg/dL — AB (ref 0.61–1.24)
Chloride: 102 mmol/L (ref 101–111)
Creatinine, Ser: 6.27 mg/dL — ABNORMAL HIGH (ref 0.61–1.24)
GFR calc Af Amer: 9 mL/min — ABNORMAL LOW (ref >60)
GFR calc non Af Amer: 8 mL/min — ABNORMAL LOW (ref >60)
GFR, EST AFRICAN AMERICAN: 9 mL/min — AB (ref >60)
GFR, EST NON AFRICAN AMERICAN: 8 mL/min — AB (ref >60)
Glucose, Bld: 117 mg/dL — ABNORMAL HIGH (ref 65–99)
Glucose, Bld: 119 mg/dL — ABNORMAL HIGH (ref 65–99)
PHOSPHORUS: 4.6 mg/dL (ref 2.5–4.6)
POTASSIUM: 4.1 mmol/L (ref 3.5–5.1)
Phosphorus: 4.6 mg/dL (ref 2.5–4.6)
Potassium: 3.9 mmol/L (ref 3.5–5.1)
SODIUM: 136 mmol/L (ref 135–145)
SODIUM: 136 mmol/L (ref 135–145)

## 2015-04-15 NOTE — Progress Notes (Signed)
Hemodialysis- Attempted to pick up pt for dialysis. "Im not missing a meal today." Explained to pt that he needs to come to HD for his scheduled treatment. "Im not going, I dont care." Will attempt to get patient later on today.

## 2015-04-15 NOTE — Progress Notes (Addendum)
[facility] Progress Note  Assessment: 1. Acute/chronic leukocytosis - baseline WBC ^25k since 2014. 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  Acute increase prob due to IV steroids given here. Back to baseline. BCx's and Cdif neg. Off abx.  2. ESRD - TTS. AVF placed at [facility]. Recent angioplasty at [facility]. Used AVF Monday w/o difficulty. ^ needle size today to 16ga.  3. Anemia - hgb 10.5- not on ESA yet -follow closely for low dose 4. Secondary hyperparathyroidism - P low -off binders Hectorol 1 - lastiPTH 436 5. Chronic hypotension - tid midodrine 20 mg 6. Nutrition - alb 2.9 -  7. Chronic ascites - periodic paracentesis 8. Right sided CHF with pul HTN - now on sildenafil 9. Afib - Cards rec coumadin 10. Volume - is 9kg under prior dry wt, no vol on exam. 96% on RA. Let vol come up a bit   Plan - HD Thursday am to get back on schedule. Should be ok for dc after that. Lower dry wt at dc.   Kelly Splinter MD [facility] pager [phone]    cell [phone] 04/15/2015, 10:51 AM    Subjective:   AVF used for HD yesterday w/o difficulty  Objective Filed Vitals:   04/15/15 0510 04/15/15 0513 04/15/15 0939 04/15/15 1044  BP: 89/58  83/47   Pulse: 81  61 89  Temp: 98.4 F (36.9 C)  98.8 F (37.1 C)   TempSrc:   Oral   Resp: 18  18   Height:      Weight:  67.9 kg (149 lb 11.1 oz)    SpO2: 94%  92%    Physical Exam goal 2 L General: ill appearing sitting up in bed on HD Heart: irreg irreg 2/6 murmur Lungs: dim BS  Abdomen: soft ascites - greatly improved Extremities: no LE edema; marked upper and lower wasting Dialysis Access: right IJ - trialysis cath and left upper AVF Qb 300 17 guage  Dialysis Orders: [facility] TTS 4.15 180 450/800 EDW 76 2 K 2.25 Ca no profile left lower AVF heparin 8400 hectorol 1  venofer 50 2/16 - last hgb 11.5   Additional Objective Labs: Basic Metabolic Panel:  Recent Labs Lab 04/12/15 0325 04/13/15 0450 04/14/15 0505  04/15/15 0255  NA 136 137 137 136  136  K 4.1 3.9 4.1 4.1  3.9  CL 101 102 101 102  102  CO2 24 22 22 22  23   GLUCOSE 116* 92 73 117*  119*  BUN 27* 43* 47* 59*  59*  CREATININE 3.66* 4.92* 5.26* 6.27*  6.29*  CALCIUM 8.1* 7.9* 7.7* 7.6*  7.7*  PHOS 3.4  --  4.0 4.6  4.6   Liver Function Tests:  Recent Labs Lab 04/13/15 0450 04/14/15 0505 04/15/15 0255  AST 21  --   --   ALT 20  --   --   ALKPHOS 66  --   --   BILITOT 0.7  --   --   PROT 5.0*  --   --   ALBUMIN 2.9* 2.8* 2.7*  2.7*   CBC:  Recent Labs Lab 04/09/15 0350 04/10/15 0453 04/12/15 0325 04/13/15 0450 04/14/15 0505  WBC 48.3* 56.8* 47.2* 29.2* 23.8*  NEUTROABS  --  52.8* 43.4* 26.0*  --   HGB 11.8* 12.5* 12.1* 10.5* 11.3*  HCT 40.1 41.4 38.0* 35.3* 35.7*  MCV 101.0* 99.8 100.0 100.0 100.3*  PLT 224 262 202 174 183   Studies/Results: US Abdomen  Limited  04/14/2015  CLINICAL DATA:  Assess for ascites EXAM: LIMITED ABDOMEN ULTRASOUND FOR ASCITES TECHNIQUE: Limited ultrasound survey for ascites was performed in all four abdominal quadrants. COMPARISON:  None. FINDINGS: There is moderate ascites throughout abdomen. IMPRESSION: Moderate ascites throughout abdomen. Electronically Signed   By: Abelardo Diesel M.D.   On: 04/14/2015 11:15   Dg Chest Port 1 View  04/14/2015  CLINICAL DATA:  Acute respiratory failure, shortness of breath, sepsis, end-stage renal disease, acute pulmonary edema, cirrhosis. EXAM: PORTABLE CHEST 1 VIEW COMPARISON:  Portable chest x-ray of April 07, 2015 FINDINGS: The lungs remain mildly hypoinflated. Small to moderate-sized bilateral pleural effusions persist. The cardiac silhouette remains enlarged. The central pulmonary vascularity is prominent. There is no significant cephalization of the vascular pattern. The right internal jugular catheter tip projects over the midportion of the SVC. IMPRESSION: Stable hypoinflation with smaller moderate-sized bilateral pleural effusions.  Cardiomegaly without frank pulmonary edema. Electronically Signed   By: David  Martinique M.D.   On: 04/14/2015 07:10   Medications:   . antiseptic oral rinse  7 mL Mouth Rinse BID  . doxercalciferol  1 mcg Intravenous Q T,Th,Sa-HD  . feeding supplement (NEPRO CARB STEADY)  237 mL Oral TID BM  . heparin  5,000 Units Subcutaneous 3 times per day  . levothyroxine  50 mcg Oral QAC breakfast  . midodrine  20 mg Oral TID WC  . sildenafil  20 mg Oral TID

## 2015-04-15 NOTE — Progress Notes (Signed)
Erik Ochoa  MDnotified about pts low bp, no new order received. Pt resting quietly in the chair. Will continue to monitor.

## 2015-04-15 NOTE — Progress Notes (Signed)
Physical Therapy Treatment Patient Details Name: Erik Ochoa MRN: [id number] DOB: 12/10/1940 Today's Date: 04/15/2015    History of Present Illness 75 year old male with a PMH of HTN, CHF, AF (not on AC), PH, Q2 week paracentesis at the [place] and ESRD who presented 2/24 to an outpatient vascular center for planned left balloon angioplasty of L AVF. Post procedure had ongoing hypotension and was referred to ED for evaluation. Admitted with working dx of sepsis (doubt infection). To ICU for CVVHD in the setting of shock thought related to pulmonary hypertension Was on levophed. RHC with normal PCWP but elevated R-sided filling pressures suggestive of RV failure. Moderate PAH and low cardiac output likely due to RV failure primarily.Pt admitted for hypotension.    PT Comments    Pt required max cues for encouragement to improve mobility and advance gait distance.  Pt would benefit from stair training next visit to ensure edcuation on safe entry into home.    Follow Up Recommendations  Home health PT;Supervision/Assistance - 24 hour     Equipment Recommendations  Rolling walker with 5" wheels (shower seat.)    Recommendations for Other Services       Precautions / Restrictions Precautions Precautions: Fall Precaution Comments: low BP Restrictions Weight Bearing Restrictions: No    Mobility  Bed Mobility Overal bed mobility:  (Pt recieved on commode.  )                Transfers Overall transfer level: Needs assistance Equipment used: Rolling walker (2 wheeled) Transfers: Sit to/from Stand Sit to Stand: Mod assist         General transfer comment: required cues to boost hips from seated surface (commode/chair with arms).  pt required cues for hand placement to improve ease of mobility.   Ambulation/Gait Ambulation/Gait assistance: Min guard Ambulation Distance (Feet): 18 Feet (+ 110 ft.  ) Assistive device: Rolling walker (2 wheeled) Gait  Pattern/deviations: Step-through pattern;Decreased stride length;Trunk flexed;Wide base of support Gait velocity: slow   General Gait Details: Cues for RW safety with turns and backing.  Cues to improve upper trunk control and B step length.  Pt fatigues quickly.     Stairs            Wheelchair Mobility    Modified Rankin (Stroke Patients Only)       Balance   Sitting-balance support: Feet supported;No upper extremity supported Sitting balance-Leahy Scale: Good       Standing balance-Leahy Scale: Fair                      Cognition Arousal/Alertness: Awake/alert Behavior During Therapy: Flat affect Overall Cognitive Status: Within Functional Limits for tasks assessed                      Exercises      General Comments        Pertinent Vitals/Pain Pain Assessment: 0-10 Pain Score: 5  Pain Location: bilat feet with weight bearing.   Pain Descriptors / Indicators: Tingling;Numbness Pain Intervention(s): Monitored during session;Repositioned    Home Living                      Prior Function            PT Goals (current goals can now be found in the care plan section) Acute Rehab PT Goals Patient Stated Goal: to get stronger Potential to Achieve Goals: Good Progress towards PT  goals: Progressing toward goals    Frequency  Min 3X/week    PT Plan      Co-evaluation             End of Session Equipment Utilized During Treatment: Gait belt Activity Tolerance: Patient limited by fatigue Patient left: in chair;with call bell/phone within reach     Time: 1018-1039 PT Time Calculation (min) (ACUTE ONLY): 21 min  Charges:  $Gait Training: 8-22 mins                    G Codes:      Cristela Blue 05-10-2015, 10:48 AM Governor Rooks, PTA pager [phone]

## 2015-04-15 NOTE — Progress Notes (Signed)
PATIENT DETAILS Name: Erik Ochoa Age: 75 y.o. 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 Cardiogenic consulted, underwent right heart cath which primarily RV failure. V/Q scan low probability. Now on Revatio 20 mg 3 times a day. Blood pressures continue to be soft-but stable.  Active Problems: ESRD: TTS. There was some concern for a visit fistula malfunction, however this seems to be working well.  Pulmonary hypertension:moderate PAH on RHC-VQ scan low probability, autoimmune workup negative.  Chronic leukocytosis/ history of essential thrombocytosis: Suspect leukocytosis worsened due to steroids. No indication of infection, all cultures negative so far, outpatient follow-up with hematology/oncology at the [facility].  Ascites: Chronic issue, apparently gets paracentesis twice a month-for the past 1 year. 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  Severe Protein calorie malnutrition:Continue supplements.  Hypothyroidism:Continue Synthroid.  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-continue Midodrine  Disposition: Remain inpatient  Antimicrobial agents  See below  Anti-infectives    Start     Dose/Rate Route Frequency Ordered Stop   04/04/15 1700  vancomycin (VANCOCIN) IVPB 1000 mg/200 mL premix  Status:  Discontinued     1,000 mg 200 mL/hr over 60 Minutes Intravenous Every 24 hours 04/03/15 1725 04/06/15 0956   04/03/15 2200  piperacillin-tazobactam (ZOSYN) IVPB 2.25 g  Status:  Discontinued     2.25 g 100 mL/hr over 30 Minutes Intravenous 3 times per day 04/03/15 1518 04/03/15 1723   04/03/15 1800  piperacillin-tazobactam (ZOSYN) IVPB 3.375 g  Status:  Discontinued     3.375  g 100 mL/hr over 30 Minutes Intravenous 4 times per day 04/03/15 1725 04/11/15 0919   04/03/15 1530  vancomycin (VANCOCIN) 1,500 mg in sodium chloride 0.9 % 500 mL IVPB     1,500 mg 250 mL/hr over 120 Minutes Intravenous  Once 04/03/15 1519 04/03/15 1933   04/03/15 1300  vancomycin (VANCOCIN) IVPB 1000 mg/200 mL premix  Status:  Discontinued     1,000 mg 200 mL/hr over 60 Minutes Intravenous  Once 04/03/15 1255 04/03/15 1518   04/03/15 1300  piperacillin-tazobactam (ZOSYN) IVPB 3.375 g  Status:  Discontinued     3.375 g 100 mL/hr over 30 Minutes Intravenous  Once 04/03/15 1255 04/03/15 1725      DVT Prophylaxis: Prophylactic Heparin   Code Status: Full code  Family Communication None at bedside  Procedures: [place] 3/1  CONSULTS:  cardiology  Time spent 30 minutes-Greater than 50% of this time was spent in counseling, explanation of diagnosis, planning of further management, and coordination of care.  MEDICATIONS: Scheduled Meds: . antiseptic oral rinse  7 mL Mouth Rinse BID  . doxercalciferol  1 mcg Intravenous Q T,Th,Sa-HD  . feeding supplement (NEPRO CARB STEADY)  237 mL Oral TID BM  . heparin  5,000 Units Subcutaneous 3 times per day  . levothyroxine  50 mcg Oral QAC breakfast  . midodrine  20 mg Oral TID WC  . sildenafil  20 mg Oral TID   Continuous Infusions:  PRN Meds:.sodium chloride, acetaminophen, ondansetron (ZOFRAN) IV, technetium TC 82M diethylenetriame-pentaacetic acid    PHYSICAL EXAM: Vital signs in last 24 hours: Filed Vitals:   04/15/15 0513 04/15/15 0939 04/15/15 1044 04/15/15 1312  BP:  83/47  81/55  Pulse:  61 89 76  Temp:  98.8 F (37.1 C)  98.6 F (37 C)  TempSrc:  Oral  Oral  Resp:  18  18  Height:      Weight: 67.9 kg (149 lb 11.1 oz)     SpO2:  92%  94%    Weight change: -0.1 kg (-3.5 oz) Filed Weights   04/13/15 1044 04/14/15 1200 04/15/15 0513  Weight: 67.2 kg (148 lb 2.4 oz) 67.1 kg (147 lb 14.9 oz) 67.9 kg (149 lb 11.1 oz)    Body mass index is 18.23 kg/(m^2).   Gen Exam: Awake and alert with clear speech.   Neck: Supple, No JVD.   Chest: B/L Clear.   CVS: S1 S2 Regular, no murmurs.  Abdomen: soft, BS +, non tender, non distended.  Extremities: no edema, lower extremities warm to touch. Neurologic: Non Focal.   Skin: No Rash.   Wounds: N/A.   Intake/Output from previous day:  Intake/Output Summary (Last 24 hours) at 04/15/15 1343 Last data filed at 04/15/15 1115  Gross per 24 hour  Intake    717 ml  Output      0 ml  Net    717 ml     LAB RESULTS: CBC  Recent Labs Lab 04/09/15 0350 04/10/15 0453 04/12/15 0325 04/13/15 0450 04/14/15 0505  WBC 48.3* 56.8* 47.2* 29.2* 23.8*  HGB 11.8* 12.5* 12.1* 10.5* 11.3*  HCT 40.1 41.4 38.0* 35.3* 35.7*  PLT 224 262 202 174 183  MCV 101.0* 99.8 100.0 100.0 100.3*  MCH 29.7 30.1 31.8 29.7 31.7  MCHC 29.4* 30.2 31.8 29.7* 31.7  RDW 20.1* 20.3* 20.6* 20.4* 21.0*  LYMPHSABS  --  3.4 3.3 2.6  --   MONOABS  --  0.6 0.5 0.3  --   EOSABS  --  0.0 0.0 0.3  --   BASOSABS  --  0.0 0.0 0.0  --     Chemistries   Recent Labs Lab 04/09/15 0350  04/10/15 0453  04/11/15 0514 04/12/15 0325 04/13/15 0450 04/14/15 0505 04/15/15 0255  NA 138  < > 138  < > 141 136 137 137 136  136  K 4.5  < > 4.6  < > 4.1 4.1 3.9 4.1 4.1  3.9  CL 105  < > 101  < > 105 101 102 101 102  102  CO2 26  < > 24  < > 22 24 22 22 22  23   GLUCOSE 110*  < > 117*  < > 84 116* 92 73 117*  119*  BUN 11  < > 13  < > 10 27* 43* 47* 59*  59*  CREATININE 1.90*  < > 2.30*  < > 1.83* 3.66* 4.92* 5.26* 6.27*  6.29*  CALCIUM 8.3*  < > 8.6*  < > 7.9* 8.1* 7.9* 7.7* 7.6*  7.7*  MG 2.5*  --  2.5*  --  2.1  --  2.4  --   --   < > = values in this interval not displayed.  CBG: No results for input(s): GLUCAP in the last 168 hours.  GFR Estimated Creatinine Clearance: 9.9 mL/min (by C-G formula based on Cr of 6.29).  Coagulation profile  Recent Labs Lab 04/13/15 0450  04/14/15 0505  INR 1.29 1.25    Cardiac Enzymes No results for input(s): CKMB, TROPONINI, MYOGLOBIN in the last 168 hours.  Invalid input(s): CK  Invalid input(s): POCBNP No results for input(s): DDIMER in the last 72  hours. No results for input(s): HGBA1C in the last 72 hours. No results for input(s): CHOL, HDL, LDLCALC, TRIG, CHOLHDL, LDLDIRECT in the last 72 hours. No results for input(s): TSH, T4TOTAL, T3FREE, THYROIDAB in the last 72 hours.  Invalid input(s): FREET3 No results for input(s): VITAMINB12, FOLATE, FERRITIN, TIBC, IRON, RETICCTPCT in the last 72 hours. No results for input(s): LIPASE, AMYLASE in the last 72 hours.  Urine Studies No results for input(s): UHGB, CRYS in the last 72 hours.  Invalid input(s): UACOL, UAPR, USPG, UPH, UTP, UGL, UKET, UBIL, UNIT, UROB, ULEU, UEPI, UWBC, URBC, UBAC, CAST, UCOM, BILUA  MICROBIOLOGY: Recent Results (from the past 240 hour(s))  C difficile quick scan w PCR reflex     Status: None   Collection Time: 04/07/15  9:49 AM  Result Value Ref Range Status   C Diff antigen NEGATIVE NEGATIVE Final   C Diff toxin NEGATIVE NEGATIVE Final   C Diff interpretation Negative for toxigenic C. difficile  Final    RADIOLOGY STUDIES/RESULTS: Dg Chest 2 View  04/03/2015  CLINICAL DATA:  Hypotension, hypoxia EXAM: CHEST  2 VIEW COMPARISON:  05/19/2012; 05/18/2012 FINDINGS: Grossly unchanged enlarged cardiac silhouette and mediastinal contours given reduced lung volumes. The pulmonary vasculature is indistinct with cephalization of flow. Interval development of small to moderate-sized bilateral effusions with associated worsening bilateral mid and lower lung heterogeneous opacities. No pneumothorax. No acute osseus abnormalities. IMPRESSION: Findings worrisome for pulmonary edema with small to moderate sized bilateral effusions and associated bibasilar opacities, atelectasis versus infiltrate. Electronically Signed   By: Sandi Mariscal M.D.   On:  04/03/2015 11:37   Nm Pulmonary Perf And Vent  04/12/2015  CLINICAL DATA:  Dyspnea, chronic atrial fibrillation, end-stage renal disease on dialysis, diastolic CHF heart failure, hypotension, volume overload, pulmonary hypertension, question chronic pulmonary embolism EXAM: NUCLEAR MEDICINE VENTILATION - PERFUSION LUNG SCAN TECHNIQUE: Ventilation images were obtained in multiple projections using inhaled aerosol Tc-35m DTPA. Perfusion images were obtained in multiple projections after intravenous injection of Tc-70m MAA. RADIOPHARMACEUTICALS:  [id number] millicuries [id number] DTPA aerosol inhalation and 4.0 millicuries [id number] MAA IV COMPARISON:  None; correlation chest radiograph 04/07/2015, none more recent FINDINGS: Ventilation: Diffusely diminished ventilation in the upper lobes. Ventilation defects that LEFT lower lobe base and the posterior RIGHT lower lobe. Central airway deposition of tracer. Perfusion: Small subsegmental perfusion defect LEFT upper lobe. Diminished perfusion at the posterior aspect of the RIGHT lower lobe and at the posterior LEFT lower lobe base, sites corresponding to pleural effusions and atelectasis on chest radiograph and matching the ventilatory abnormalities. No additional segmental or subsegmental perfusion defects identified. IMPRESSION: Matching abnormal ventilation and perfusion at the lower lobes corresponding to areas of atelectasis and effusion seen on chest radiography, though the available chest radiograph is 67 days old. Observed perfusion abnormalities are less than expected based on a degree of atelectasis and volumes of pleural effusions identified by radiography. Findings represent a low probability for pulmonary embolism. Electronically Signed   By: Lavonia Dana M.D.   On: 04/12/2015 10:19   US Abdomen Limited  04/14/2015  CLINICAL DATA:  Assess for ascites EXAM: LIMITED ABDOMEN ULTRASOUND FOR ASCITES TECHNIQUE: Limited ultrasound survey for ascites was  performed in all four abdominal quadrants. COMPARISON:  None. FINDINGS: There is moderate ascites throughout abdomen. IMPRESSION: Moderate ascites throughout abdomen. Electronically Signed   By: Abelardo Diesel M.D.   On: 04/14/2015 11:15   Dg Chest Port 1 View  04/14/2015  CLINICAL DATA:  Acute respiratory failure, shortness of  breath, sepsis, end-stage renal disease, acute pulmonary edema, cirrhosis. EXAM: PORTABLE CHEST 1 VIEW COMPARISON:  Portable chest x-ray of April 07, 2015 FINDINGS: The lungs remain mildly hypoinflated. Small to moderate-sized bilateral pleural effusions persist. The cardiac silhouette remains enlarged. The central pulmonary vascularity is prominent. There is no significant cephalization of the vascular pattern. The right internal jugular catheter tip projects over the midportion of the SVC. IMPRESSION: Stable hypoinflation with smaller moderate-sized bilateral pleural effusions. Cardiomegaly without frank pulmonary edema. Electronically Signed   By: David  Martinique M.D.   On: 04/14/2015 07:10   Dg Chest Port 1 View  04/07/2015  CLINICAL DATA:  Right central line placement EXAM: PORTABLE CHEST 1 VIEW COMPARISON:  04/07/2015 FINDINGS: Right jugular central venous catheter with the tip projecting over the SVC. Small bilateral pleural effusions. Mild bilateral interstitial thickening. There is no pneumothorax. The heart and mediastinum are stable. The osseous structures are unremarkable. IMPRESSION: 1. Right jugular central venous catheter with the tip projecting over the SVC. 2. Mild CHF. Electronically Signed   By: Kathreen Devoid   On: 04/07/2015 14:54   Dg Chest Port 1 View  04/07/2015  CLINICAL DATA:  Assess right central line, after being accidentally tugged. Initial encounter. EXAM: PORTABLE CHEST 1 VIEW COMPARISON:  Chest radiograph performed 04/03/2015 FINDINGS: The patient's right IJ dual-lumen catheter has been retracted, now seen ending overlying the proximal SVC. Small  bilateral pleural effusions are noted, larger on the right. Bibasilar airspace opacities may reflect mild interstitial edema or pneumonia. No pneumothorax is seen. The cardiomediastinal silhouette is borderline normal in size. No acute osseous abnormalities are identified. IMPRESSION: 1. Right IJ line has been retracted, now seen ending overlying the proximal SVC. 2. Small bilateral pleural effusions, larger on the right. Bibasilar airspace opacities may reflect mild interstitial edema or pneumonia. Electronically Signed   By: Garald Balding M.D.   On: 04/07/2015 01:46   Dg Chest Port 1 View  04/03/2015  CLINICAL DATA:  Encounter for central line placement. EXAM: PORTABLE CHEST - 1 VIEW COMPARISON:  Two-view chest x-ray from the same day. FINDINGS: The heart is enlarged. Atherosclerotic calcifications are present at the aortic arch. Bilateral pleural effusions are again noted. Any right IJ line is in place. There is no pneumothorax. Lung volumes have slightly improved. IMPRESSION: 1. New right IJ line without radiographic evidence for complication. 2. Slightly improved lung volumes with persistent bilateral pleural effusions and mild diffuse edema. Electronically Signed   By: San Morelle M.D.   On: 04/03/2015 16:40    Oren Binet, MD  [facility] Pager:[phone]  If 7PM-7AM, please contact night-coverage [email or web address] Password [email or web address] 04/15/2015, 1:43 PM   LOS: 12 days

## 2015-04-16 DIAGNOSIS — J9601 Acute respiratory failure with hypoxia: Secondary | ICD-10-CM

## 2015-04-16 LAB — RENAL FUNCTION PANEL
ANION GAP: 16 — AB (ref 5–15)
Albumin: 2.8 g/dL — ABNORMAL LOW (ref 3.5–5.0)
BUN: 74 mg/dL — ABNORMAL HIGH (ref 6–20)
CALCIUM: 8 mg/dL — AB (ref 8.9–10.3)
CO2: 20 mmol/L — ABNORMAL LOW (ref 22–32)
CREATININE: 7.39 mg/dL — AB (ref 0.61–1.24)
Chloride: 100 mmol/L — ABNORMAL LOW (ref 101–111)
GFR, EST AFRICAN AMERICAN: 7 mL/min — AB (ref >60)
GFR, EST NON AFRICAN AMERICAN: 6 mL/min — AB (ref >60)
Glucose, Bld: 97 mg/dL (ref 65–99)
Phosphorus: 5.1 mg/dL — ABNORMAL HIGH (ref 2.5–4.6)
Potassium: 4.4 mmol/L (ref 3.5–5.1)
SODIUM: 136 mmol/L (ref 135–145)

## 2015-04-16 LAB — CBC
HCT: 33.7 % — ABNORMAL LOW (ref 39.0–52.0)
HEMOGLOBIN: 11.1 g/dL — AB (ref 13.0–17.0)
MCH: 32.4 pg (ref 26.0–34.0)
MCHC: 32.9 g/dL (ref 30.0–36.0)
MCV: 98.3 fL (ref 78.0–100.0)
PLATELETS: 202 10*3/uL (ref 150–400)
RBC: 3.43 MIL/uL — AB (ref 4.22–5.81)
RDW: 21 % — ABNORMAL HIGH (ref 11.5–15.5)
WBC: 26.5 10*3/uL — AB (ref 4.0–10.5)

## 2015-04-16 MED ORDER — LIDOCAINE-PRILOCAINE 2.5-2.5 % EX CREA
1.0000 "application " | TOPICAL_CREAM | CUTANEOUS | Status: DC | PRN
  Filled 2015-04-16: qty 5

## 2015-04-16 MED ORDER — SODIUM CHLORIDE 0.9 % IV SOLN: 100.0000 mL | INTRAVENOUS | Status: DC | PRN

## 2015-04-16 MED ORDER — HEPARIN SODIUM (PORCINE) 1000 UNIT/ML DIALYSIS: 4000.0000 [IU] | INTRAMUSCULAR | Status: DC | PRN

## 2015-04-16 MED ORDER — ASPIRIN EC 81 MG PO TBEC: 81.0000 mg | DELAYED_RELEASE_TABLET | Freq: Every day | ORAL | Status: AC

## 2015-04-16 MED ORDER — PENTAFLUOROPROP-TETRAFLUOROETH EX AERO: 1.0000 "application " | INHALATION_SPRAY | CUTANEOUS | Status: DC | PRN

## 2015-04-16 MED ORDER — ALTEPLASE 2 MG IJ SOLR
2.0000 mg | Freq: Once | INTRAMUSCULAR | Status: DC | PRN
  Filled 2015-04-16: qty 2

## 2015-04-16 MED ORDER — SILDENAFIL CITRATE 20 MG PO TABS: 20.0000 mg | ORAL_TABLET | Freq: Three times a day (TID) | ORAL | Status: AC

## 2015-04-16 MED ORDER — HEPARIN SODIUM (PORCINE) 1000 UNIT/ML DIALYSIS: 1000.0000 [IU] | INTRAMUSCULAR | Status: DC | PRN

## 2015-04-16 MED ORDER — LIDOCAINE HCL (PF) 1 % IJ SOLN: 5.0000 mL | INTRAMUSCULAR | Status: DC | PRN

## 2015-04-16 MED ORDER — HEPARIN SODIUM (PORCINE) 1000 UNIT/ML DIALYSIS: 3000.0000 [IU] | INTRAMUSCULAR | Status: DC | PRN

## 2015-04-16 MED ORDER — MIDODRINE HCL 10 MG PO TABS: 20.0000 mg | ORAL_TABLET | Freq: Three times a day (TID) | ORAL | Status: DC

## 2015-04-16 MED ORDER — DOXERCALCIFEROL 4 MCG/2ML IV SOLN
INTRAVENOUS | Status: AC
  Administered 2015-04-16: 1 ug via INTRAVENOUS
  Filled 2015-04-16: qty 2

## 2015-04-16 MED ORDER — MIDODRINE HCL 5 MG PO TABS
ORAL_TABLET | ORAL | Status: AC
  Administered 2015-04-16: 20 mg via ORAL
  Filled 2015-04-16: qty 4

## 2015-04-16 MED ORDER — HEPARIN SODIUM (PORCINE) 1000 UNIT/ML DIALYSIS: 20.0000 [IU]/kg | INTRAMUSCULAR | Status: DC | PRN

## 2015-04-16 MED ORDER — ACETAMINOPHEN 325 MG PO TABS
ORAL_TABLET | ORAL | Status: AC
  Administered 2015-04-16: 650 mg via ORAL
  Filled 2015-04-16: qty 2

## 2015-04-16 MED ORDER — NEPRO/CARBSTEADY PO LIQD: 237.0000 mL | Freq: Three times a day (TID) | ORAL | Status: DC

## 2015-04-16 NOTE — Discharge Summary (Signed)
PATIENT DETAILS Name: Erik Ochoa Age: 75 y.o. Sex: male Date of Birth: [birth year] MRN: [id number]. Admitting Physician: Chesley Mires, MD [id number], Perviz  Admit Date: 04/03/2015 Discharge date: 04/16/2015  Recommendations for Outpatient Follow-up:  1. Ensure follow up with cardiology and Hematology 2. Continue HD at usual schedule   PRIMARY DISCHARGE DIAGNOSIS:  Principal Problem:   Shock ([place]) Active Problems:   Pulmonary hypertension (HCC)   Hypothyroid   Leukocytosis   Sepsis ([place])   ESRD (end stage renal disease) ([place])   Pressure ulcer   Acute pulmonary edema (HCC)   Encounter for central line placement   Pleural effusion   Protein-calorie malnutrition, severe   Adrenal insufficiency (HCC)   Liver cirrhosis ([place])   Acute respiratory failure ([place])   Ascites      PAST MEDICAL HISTORY: Past Medical History  Diagnosis Date  . Hypertension   . Renal disorder   . ET (essential thrombocythemia) ([place])   . Hypothyroid   . CHF (congestive heart failure) ([place])   . A-fib ([place])   . Hyperlipidemia   . Pulmonary hypertension ([place])   . Nephrolithiasis   . History of leukocytosis 2014    Chronic leukocytosis, baseline 25K approx dating back to 2014    DISCHARGE MEDICATIONS: Current Discharge Medication List    START taking these medications   Details  aspirin EC 81 MG tablet Take 1 tablet (81 mg total) by mouth daily.    midodrine (PROAMATINE) 10 MG tablet Take 2 tablets (20 mg total) by mouth 3 (three) times daily with meals. Qty: 180 tablet, Refills: 0    Nutritional Supplements (FEEDING SUPPLEMENT, NEPRO CARB STEADY,) LIQD Take 237 mLs by mouth 3 (three) times daily between meals. Qty: 90 Can, Refills: 0    sildenafil (REVATIO) 20 MG tablet Take 1 tablet (20 mg total) by mouth 3 (three) times daily. Qty: 90 tablet, Refills: 0      CONTINUE these medications which have NOT CHANGED   Details  levothyroxine (SYNTHROID) 50 MCG tablet Take 50 mcg by  mouth daily before breakfast.      STOP taking these medications     amLODipine (NORVASC) 10 MG tablet      calcium acetate (PHOSLO) 667 MG capsule      Febuxostat 80 MG TABS      GABAPENTIN PO      furosemide (LASIX) 80 MG tablet      hydrALAZINE (APRESOLINE) 50 MG tablet      metoprolol succinate (TOPROL-XL) 100 MG 24 hr tablet      potassium chloride 20 MEQ TBCR         ALLERGIES:  No Known Allergies  BRIEF HPI:  See H&P, Labs, Consult and Test reports for all details in brief, 75 year old male with a PMH of HTN, CHF, AF (not on AC), PH, Q2 week paracentesis at the [place] and ESRD who presented 2/24 to an outpatient vascular center for planned left balloon angioplasty of L AVF. Post procedure had ongoing hypotension and was referred to ED for evaluation  CONSULTATIONS:   cardiology, pulmonary/intensive care and nephrology  PERTINENT RADIOLOGIC STUDIES: Dg Chest 2 View  04/03/2015  CLINICAL DATA:  Hypotension, hypoxia EXAM: CHEST  2 VIEW COMPARISON:  05/19/2012; 05/18/2012 FINDINGS: Grossly unchanged enlarged cardiac silhouette and mediastinal contours given reduced lung volumes. The pulmonary vasculature is indistinct with cephalization of flow. Interval development of small to moderate-sized bilateral effusions with associated worsening bilateral mid and lower lung heterogeneous opacities. No pneumothorax.  No acute osseus abnormalities. IMPRESSION: Findings worrisome for pulmonary edema with small to moderate sized bilateral effusions and associated bibasilar opacities, atelectasis versus infiltrate. Electronically Signed   By: Sandi Mariscal M.D.   On: 04/03/2015 11:37   Nm Pulmonary Perf And Vent  04/12/2015  CLINICAL DATA:  Dyspnea, chronic atrial fibrillation, end-stage renal disease on dialysis, diastolic CHF heart failure, hypotension, volume overload, pulmonary hypertension, question chronic pulmonary embolism EXAM: NUCLEAR MEDICINE VENTILATION - PERFUSION LUNG SCAN  TECHNIQUE: Ventilation images were obtained in multiple projections using inhaled aerosol Tc-3m DTPA. Perfusion images were obtained in multiple projections after intravenous injection of Tc-7m MAA. RADIOPHARMACEUTICALS:  [id number] millicuries [id number] DTPA aerosol inhalation and 4.0 millicuries [id number] MAA IV COMPARISON:  None; correlation chest radiograph 04/07/2015, none more recent FINDINGS: Ventilation: Diffusely diminished ventilation in the upper lobes. Ventilation defects that LEFT lower lobe base and the posterior RIGHT lower lobe. Central airway deposition of tracer. Perfusion: Small subsegmental perfusion defect LEFT upper lobe. Diminished perfusion at the posterior aspect of the RIGHT lower lobe and at the posterior LEFT lower lobe base, sites corresponding to pleural effusions and atelectasis on chest radiograph and matching the ventilatory abnormalities. No additional segmental or subsegmental perfusion defects identified. IMPRESSION: Matching abnormal ventilation and perfusion at the lower lobes corresponding to areas of atelectasis and effusion seen on chest radiography, though the available chest radiograph is 61 days old. Observed perfusion abnormalities are less than expected based on a degree of atelectasis and volumes of pleural effusions identified by radiography. Findings represent a low probability for pulmonary embolism. Electronically Signed   By: Lavonia Dana M.D.   On: 04/12/2015 10:19   US Abdomen Limited  04/14/2015  CLINICAL DATA:  Assess for ascites EXAM: LIMITED ABDOMEN ULTRASOUND FOR ASCITES TECHNIQUE: Limited ultrasound survey for ascites was performed in all four abdominal quadrants. COMPARISON:  None. FINDINGS: There is moderate ascites throughout abdomen. IMPRESSION: Moderate ascites throughout abdomen. Electronically Signed   By: Abelardo Diesel M.D.   On: 04/14/2015 11:15   Dg Chest Port 1 View  04/14/2015  CLINICAL DATA:  Acute respiratory failure, shortness of  breath, sepsis, end-stage renal disease, acute pulmonary edema, cirrhosis. EXAM: PORTABLE CHEST 1 VIEW COMPARISON:  Portable chest x-ray of April 07, 2015 FINDINGS: The lungs remain mildly hypoinflated. Small to moderate-sized bilateral pleural effusions persist. The cardiac silhouette remains enlarged. The central pulmonary vascularity is prominent. There is no significant cephalization of the vascular pattern. The right internal jugular catheter tip projects over the midportion of the SVC. IMPRESSION: Stable hypoinflation with smaller moderate-sized bilateral pleural effusions. Cardiomegaly without frank pulmonary edema. Electronically Signed   By: David  Martinique M.D.   On: 04/14/2015 07:10   Dg Chest Port 1 View  04/07/2015  CLINICAL DATA:  Right central line placement EXAM: PORTABLE CHEST 1 VIEW COMPARISON:  04/07/2015 FINDINGS: Right jugular central venous catheter with the tip projecting over the SVC. Small bilateral pleural effusions. Mild bilateral interstitial thickening. There is no pneumothorax. The heart and mediastinum are stable. The osseous structures are unremarkable. IMPRESSION: 1. Right jugular central venous catheter with the tip projecting over the SVC. 2. Mild CHF. Electronically Signed   By: Kathreen Devoid   On: 04/07/2015 14:54   Dg Chest Port 1 View  04/07/2015  CLINICAL DATA:  Assess right central line, after being accidentally tugged. Initial encounter. EXAM: PORTABLE CHEST 1 VIEW COMPARISON:  Chest radiograph performed 04/03/2015 FINDINGS: The patient's right IJ dual-lumen catheter has been retracted, now seen ending overlying the  proximal SVC. Small bilateral pleural effusions are noted, larger on the right. Bibasilar airspace opacities may reflect mild interstitial edema or pneumonia. No pneumothorax is seen. The cardiomediastinal silhouette is borderline normal in size. No acute osseous abnormalities are identified. IMPRESSION: 1. Right IJ line has been retracted, now seen ending  overlying the proximal SVC. 2. Small bilateral pleural effusions, larger on the right. Bibasilar airspace opacities may reflect mild interstitial edema or pneumonia. Electronically Signed   By: Garald Balding M.D.   On: 04/07/2015 01:46   Dg Chest Port 1 View  04/03/2015  CLINICAL DATA:  Encounter for central line placement. EXAM: PORTABLE CHEST - 1 VIEW COMPARISON:  Two-view chest x-ray from the same day. FINDINGS: The heart is enlarged. Atherosclerotic calcifications are present at the aortic arch. Bilateral pleural effusions are again noted. Any right IJ line is in place. There is no pneumothorax. Lung volumes have slightly improved. IMPRESSION: 1. New right IJ line without radiographic evidence for complication. 2. Slightly improved lung volumes with persistent bilateral pleural effusions and mild diffuse edema. Electronically Signed   By: San Morelle M.D.   On: 04/03/2015 16:40     PERTINENT LAB RESULTS: CBC:  Recent Labs  04/14/15 0505 04/16/15 0857  WBC 23.8* 26.5*  HGB 11.3* 11.1*  HCT 35.7* 33.7*  PLT 183 202   CMET CMP     Component Value Date/Time   NA 136 04/16/2015 0229   K 4.4 04/16/2015 0229   CL 100* 04/16/2015 0229   CO2 20* 04/16/2015 0229   GLUCOSE 97 04/16/2015 0229   BUN 74* 04/16/2015 0229   CREATININE 7.39* 04/16/2015 0229   CALCIUM 8.0* 04/16/2015 0229   PROT 5.0* 04/13/2015 0450   ALBUMIN 2.8* 04/16/2015 0229   AST 21 04/13/2015 0450   ALT 20 04/13/2015 0450   ALKPHOS 66 04/13/2015 0450   BILITOT 0.7 04/13/2015 0450   GFRNONAA 6* 04/16/2015 0229   GFRAA 7* 04/16/2015 0229    GFR Estimated Creatinine Clearance: 8.4 mL/min (by C-G formula based on Cr of 7.39). No results for input(s): LIPASE, AMYLASE in the last 72 hours. No results for input(s): CKTOTAL, CKMB, CKMBINDEX, TROPONINI in the last 72 hours. Invalid input(s): POCBNP No results for input(s): DDIMER in the last 72 hours. No results for input(s): HGBA1C in the last 72 hours. No  results for input(s): CHOL, HDL, LDLCALC, TRIG, CHOLHDL, LDLDIRECT in the last 72 hours. No results for input(s): TSH, T4TOTAL, T3FREE, THYROIDAB in the last 72 hours.  Invalid input(s): FREET3 No results for input(s): VITAMINB12, FOLATE, FERRITIN, TIBC, IRON, RETICCTPCT in the last 72 hours. Coags:  Recent Labs  04/14/15 0505  INR 1.25   Microbiology: Recent Results (from the past 240 hour(s))  C difficile quick scan w PCR reflex     Status: None   Collection Time: 04/07/15  9:49 AM  Result Value Ref Range Status   C Diff antigen NEGATIVE NEGATIVE Final   C Diff toxin NEGATIVE NEGATIVE Final   C Diff interpretation Negative for toxigenic C. 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 Cardiology consulted, underwent right heart cath which showed primarily RV failure. V/Q scan low probability. Now on Revatio 20 mg 3 times a day. Blood pressures continue to be soft-but stable.Stable for discharge, no further recommendations from cardiology  Active Problems: ESRD: TTS. 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 Moderate AS, mild MR. RHC showed PA 53/17, mean 30.  Atrial fibrillation: Chronic.Has not been on anticoagulation, per him, because he "bruises too much." This MD spoke with patient today-he continues to want to stay off any anticoagulation-he is aware of stroke and other cardioembolic risk and its life threatening and life disabling consequences-he is willing to try ASA instead.He does  follow with cardiology at [facility] system-I have asked him to see his cardiologist if he changes his mind.  Chronic leukocytosis/ history of essential thrombocytosis with JAK 2 positive: Has chronic leukocytosis due to JAK 2 positive.Suspect leukocytosis worsened due to steroids. No indication of infection, all cultures negative so far, outpatient follow-up with hematology/oncology at the [facility] (already establised with hematology at [facility] system)  Ascites: Chronic issue, apparently gets paracentesis twice a month-for the past 1 year. 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  Severe Protein calorie malnutrition:Continue supplements.  Hypothyroidism:Continue Synthroid.  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   Objective:   Blood pressure 82/56, pulse 76, temperature 98 F (36.7 C), temperature source Oral, resp. rate 16, height 6\' 4"  (1.93 m), weight 68 kg (149 lb 14.6 oz), SpO2 94 %.  Intake/Output Summary (Last 24 hours) at 04/16/15 1404 Last data filed at 04/16/15 1225  Gross per 24 hour  Intake      0 ml  Output      1 ml  Net     -1 ml   Filed Weights   04/16/15 0400 04/16/15 0836 04/16/15 1225  Weight: 68.4 kg (150 lb 12.7 oz) 68.3 kg (150 lb 9.2 oz) 68 kg (149 lb 14.6 oz)    Exam Awake Alert, Oriented *3, No new F.N deficits, Normal affect [place].AT,PERRAL Supple Neck,No JVD, No cervical lymphadenopathy appriciated.  Symmetrical Chest wall movement, Good air movement bilaterally, CTAB RRR,No Gallops,Rubs or new Murmurs, No Parasternal Heave +ve B.Sounds, Abd Soft, Non tender, No organomegaly appriciated, No rebound -guarding or rigidity. No Cyanosis, Clubbing or edema, No new Rash or bruise  DISCHARGE CONDITION: Stable  DISPOSITION: Home  with home health services  DISCHARGE INSTRUCTIONS:    Activity:  As tolerated with Full fall precautions use walker/cane & assistance as needed  Get Medicines reviewed and adjusted: Please take all your medications with you for your next visit with your Primary MD  Please request your Primary MD to go over all hospital tests and procedure/radiological results at the follow up, please ask your Primary MD to get all Hospital records sent to his/her office.  If you experience worsening of your admission symptoms, develop shortness of breath, life threatening emergency, suicidal or homicidal thoughts you must seek medical attention immediately by calling 911 or calling your MD immediately  if symptoms less severe.  You must read complete instructions/literature along with all the possible adverse reactions/side effects for all the Medicines you take and that have been prescribed to you. Take any new Medicines after you have completely understood and accpet all the possible adverse reactions/side effects.   Do not  drive when taking Pain medications.   Do not take more than prescribed Pain, Sleep and Anxiety Medications  Special Instructions: If you have smoked or chewed Tobacco  in the last 2 yrs please stop smoking, stop any regular Alcohol  and or any Recreational drug use.  Wear Seat belts while driving.  Please note  You were cared for by a hospitalist during your hospital stay. Once you are discharged, your primary care physician will handle any further medical issues. Please note that NO REFILLS for any discharge medications will be authorized once you are discharged, as it is imperative that you return to your primary care physician (or establish a relationship with a primary care physician if you do not have one) for your aftercare needs so that they can reassess your need for medications and monitor your lab values.   Diet recommendation: Heart Healthy diet  Discharge Instructions     Call MD for:  difficulty breathing, headache or visual disturbances    Complete by:  As directed      Diet - low sodium heart healthy    Complete by:  As directed      Increase activity slowly    Complete by:  As directed            Follow-up Information    Follow up with [place], [place]. Schedule an appointment as soon as possible for a visit in 1 week.   Specialty:  Internal Medicine      Schedule an appointment as soon as possible for a visit in 1 month to follow up.   Why:  hematologist at the [facility] system     Total Time spent on discharge equals 45 minutes.  SignedOren Binet 04/16/2015 2:04 PM

## 2015-04-16 NOTE — Progress Notes (Signed)
[facility] Progress Note  Assessment: 1. Acute/chronic leukocytosis - baseline WBC ^25k since 2014. 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  Acute ^ due to IV steroids given here. Back to baseline. BCx's and Cdif neg. Off abx.  2. ESRD - TTS. AVF placed at [facility]. Recent angioplasty at [facility]. Using AVF now 3. Anemia - hgb 10.5- not on ESA yet -follow closely for low dose 4. Secondary hyperparathyroidism - P low -off binders Hectorol 1 - lastiPTH 436 5. Chronic hypotension - tid midodrine 20 mg 6. Nutrition - alb 2.9 -  7. Chronic ascites - periodic paracentesis 8. Right sided CHF with pul HTN - now on sildenafil 9. Afib - Cards rec coumadin 10. Volume - is 9kg under prior dry wt, no vol on exam. 96% on RA. Set new lower dry wt at dc 11. Dispo - ok for dc from renal standpoint   Plan - HD today.   Kelly Splinter MD [facility] pager [phone]    cell [phone] 04/16/2015, 11:37 AM    Subjective:   AVF used for HD yesterday w/o difficulty  Objective Filed Vitals:   04/16/15 0928 04/16/15 0957 04/16/15 1026 04/16/15 1057  BP: 76/50 85/57 79/57  81/55  Pulse: 80 81 73 80  Temp:      TempSrc:      Resp: 17 20 19 19   Height:      Weight:      SpO2:       Physical Exam goal 2 L General: ill appearing sitting up in bed on HD Heart: irreg irreg 2/6 murmur Lungs: dim BS  Abdomen: soft ascites - greatly improved Extremities: no LE edema; marked upper and lower wasting Dialysis Access: right IJ - trialysis cath and left upper AVF Qb 300 17 guage  Dialysis Orders: [facility] TTS 4.15 180 450/800 EDW 76 2 K 2.25 Ca no profile left lower AVF heparin 8400 hectorol 1  venofer 50 2/16 - last hgb 11.5   Additional Objective Labs: Basic Metabolic Panel:  Recent Labs Lab 04/14/15 0505 04/15/15 0255 04/16/15 0229  NA 137 136  136 136  K 4.1 4.1  3.9 4.4  CL 101 102  102 100*  CO2 22 22  23  20*  GLUCOSE 73 117*  119* 97  BUN 47* 59*  59* 74*   CREATININE 5.26* 6.27*  6.29* 7.39*  CALCIUM 7.7* 7.6*  7.7* 8.0*  PHOS 4.0 4.6  4.6 5.1*   Liver Function Tests:  Recent Labs Lab 04/13/15 0450 04/14/15 0505 04/15/15 0255 04/16/15 0229  AST 21  --   --   --   ALT 20  --   --   --   ALKPHOS 66  --   --   --   BILITOT 0.7  --   --   --   PROT 5.0*  --   --   --   ALBUMIN 2.9* 2.8* 2.7*  2.7* 2.8*   CBC:  Recent Labs Lab 04/10/15 0453 04/12/15 0325 04/13/15 0450 04/14/15 0505 04/16/15 0857  WBC 56.8* 47.2* 29.2* 23.8* 26.5*  NEUTROABS 52.8* 43.4* 26.0*  --   --   HGB 12.5* 12.1* 10.5* 11.3* 11.1*  HCT 41.4 38.0* 35.3* 35.7* 33.7*  MCV 99.8 100.0 100.0 100.3* 98.3  PLT 262 202 174 183 202   Studies/Results: No results found. Medications:   . antiseptic oral rinse  7 mL Mouth Rinse BID  . doxercalciferol  1 mcg Intravenous Q T,Th,Sa-HD  .  feeding supplement (NEPRO CARB STEADY)  237 mL Oral TID BM  . heparin  5,000 Units Subcutaneous 3 times per day  . levothyroxine  50 mcg Oral QAC breakfast  . midodrine  20 mg Oral TID WC  . sildenafil  20 mg Oral TID

## 2015-04-16 NOTE — Progress Notes (Signed)
Reviewed discharge instructions with patient and friend. Iv removed. Wheelchair used to escort patient out.  Leathie Weich, Mervin Kung RN

## 2015-04-16 NOTE — Care Management Note (Signed)
Case Management Note  Patient Details  Name: Erik Ochoa MRN: [id number] Date of Birth: 1940/04/18  Subjective/Objective:  75 y.o. M admitted 04/03/2015 with Pulmonary HTN and Sepsis.                   Action/Plan: Anticipate discharge home today with HHRN and HHPT provided by [facility]. Texted Manuela Schwartz, [facility] rep.  No further CM needs but will be available should additional discharge needs arise.  Expected Discharge Date:                  Expected Discharge Plan:  [place]  In-House Referral:     Discharge planning Services  CM Consult, Medication Assistance  Post Acute Care Choice:    Choice offered to:  Patient  DME Arranged:    DME Agency:     HH Arranged:  RN, PT [place] Agency:  [facility]  Status of Service:  Completed, signed off  Medicare Important Message Given:  Yes Date Medicare IM Given:    Medicare IM give by:    Date Additional Medicare IM Given:    Additional Medicare Important Message give by:     If discussed at [place] of Stay Meetings, dates discussed:    Additional Comments:  Delrae Sawyers, RN 04/16/2015, 2:27 PM

## 2015-05-02 ENCOUNTER — Emergency Department (HOSPITAL_COMMUNITY): Payer: Medicare Other

## 2015-05-02 ENCOUNTER — Inpatient Hospital Stay (HOSPITAL_COMMUNITY)
Admission: EM | Admit: 2015-05-02 | Discharge: 2015-05-09 | DRG: 871 | Disposition: E | Payer: Medicare Other | Attending: Internal Medicine | Admitting: Internal Medicine

## 2015-05-02 ENCOUNTER — Encounter (HOSPITAL_COMMUNITY): Payer: Self-pay

## 2015-05-02 DIAGNOSIS — Z809 Family history of malignant neoplasm, unspecified: Secondary | ICD-10-CM | POA: Diagnosis not present

## 2015-05-02 DIAGNOSIS — R188 Other ascites: Secondary | ICD-10-CM | POA: Diagnosis present

## 2015-05-02 DIAGNOSIS — T82868A Thrombosis of vascular prosthetic devices, implants and grafts, initial encounter: Secondary | ICD-10-CM

## 2015-05-02 DIAGNOSIS — Z7982 Long term (current) use of aspirin: Secondary | ICD-10-CM | POA: Diagnosis not present

## 2015-05-02 DIAGNOSIS — E875 Hyperkalemia: Secondary | ICD-10-CM | POA: Diagnosis not present

## 2015-05-02 DIAGNOSIS — R579 Shock, unspecified: Secondary | ICD-10-CM | POA: Diagnosis present

## 2015-05-02 DIAGNOSIS — I4891 Unspecified atrial fibrillation: Secondary | ICD-10-CM | POA: Diagnosis present

## 2015-05-02 DIAGNOSIS — Z89021 Acquired absence of right finger(s): Secondary | ICD-10-CM

## 2015-05-02 DIAGNOSIS — R197 Diarrhea, unspecified: Secondary | ICD-10-CM | POA: Diagnosis present

## 2015-05-02 DIAGNOSIS — Z992 Dependence on renal dialysis: Secondary | ICD-10-CM

## 2015-05-02 DIAGNOSIS — E785 Hyperlipidemia, unspecified: Secondary | ICD-10-CM | POA: Diagnosis present

## 2015-05-02 DIAGNOSIS — T82898A Other specified complication of vascular prosthetic devices, implants and grafts, initial encounter: Secondary | ICD-10-CM | POA: Diagnosis not present

## 2015-05-02 DIAGNOSIS — D72829 Elevated white blood cell count, unspecified: Secondary | ICD-10-CM | POA: Diagnosis present

## 2015-05-02 DIAGNOSIS — E162 Hypoglycemia, unspecified: Secondary | ICD-10-CM | POA: Diagnosis not present

## 2015-05-02 DIAGNOSIS — K529 Noninfective gastroenteritis and colitis, unspecified: Secondary | ICD-10-CM | POA: Diagnosis present

## 2015-05-02 DIAGNOSIS — Z7189 Other specified counseling: Secondary | ICD-10-CM | POA: Diagnosis not present

## 2015-05-02 DIAGNOSIS — K920 Hematemesis: Secondary | ICD-10-CM | POA: Diagnosis not present

## 2015-05-02 DIAGNOSIS — E43 Unspecified severe protein-calorie malnutrition: Secondary | ICD-10-CM | POA: Diagnosis present

## 2015-05-02 DIAGNOSIS — I5032 Chronic diastolic (congestive) heart failure: Secondary | ICD-10-CM | POA: Diagnosis present

## 2015-05-02 DIAGNOSIS — I272 Other secondary pulmonary hypertension: Secondary | ICD-10-CM | POA: Diagnosis present

## 2015-05-02 DIAGNOSIS — D62 Acute posthemorrhagic anemia: Secondary | ICD-10-CM | POA: Diagnosis not present

## 2015-05-02 DIAGNOSIS — R110 Nausea: Secondary | ICD-10-CM | POA: Diagnosis not present

## 2015-05-02 DIAGNOSIS — Z79899 Other long term (current) drug therapy: Secondary | ICD-10-CM

## 2015-05-02 DIAGNOSIS — R634 Abnormal weight loss: Secondary | ICD-10-CM | POA: Diagnosis present

## 2015-05-02 DIAGNOSIS — I9589 Other hypotension: Secondary | ICD-10-CM | POA: Diagnosis not present

## 2015-05-02 DIAGNOSIS — D631 Anemia in chronic kidney disease: Secondary | ICD-10-CM | POA: Diagnosis present

## 2015-05-02 DIAGNOSIS — Z515 Encounter for palliative care: Secondary | ICD-10-CM | POA: Insufficient documentation

## 2015-05-02 DIAGNOSIS — R571 Hypovolemic shock: Secondary | ICD-10-CM | POA: Diagnosis present

## 2015-05-02 DIAGNOSIS — N186 End stage renal disease: Secondary | ICD-10-CM | POA: Diagnosis present

## 2015-05-02 DIAGNOSIS — Z66 Do not resuscitate: Secondary | ICD-10-CM | POA: Diagnosis not present

## 2015-05-02 DIAGNOSIS — I2729 Other secondary pulmonary hypertension: Secondary | ICD-10-CM | POA: Diagnosis present

## 2015-05-02 DIAGNOSIS — I959 Hypotension, unspecified: Secondary | ICD-10-CM

## 2015-05-02 DIAGNOSIS — Z681 Body mass index (BMI) 19 or less, adult: Secondary | ICD-10-CM | POA: Diagnosis not present

## 2015-05-02 DIAGNOSIS — Y832 Surgical operation with anastomosis, bypass or graft as the cause of abnormal reaction of the patient, or of later complication, without mention of misadventure at the time of the procedure: Secondary | ICD-10-CM | POA: Diagnosis not present

## 2015-05-02 DIAGNOSIS — R1084 Generalized abdominal pain: Secondary | ICD-10-CM | POA: Insufficient documentation

## 2015-05-02 DIAGNOSIS — I50810 Right heart failure, unspecified: Secondary | ICD-10-CM

## 2015-05-02 DIAGNOSIS — E039 Hypothyroidism, unspecified: Secondary | ICD-10-CM | POA: Diagnosis present

## 2015-05-02 DIAGNOSIS — I132 Hypertensive heart and chronic kidney disease with heart failure and with stage 5 chronic kidney disease, or end stage renal disease: Secondary | ICD-10-CM | POA: Diagnosis present

## 2015-05-02 DIAGNOSIS — I482 Chronic atrial fibrillation: Secondary | ICD-10-CM | POA: Diagnosis not present

## 2015-05-02 LAB — CBC WITH DIFFERENTIAL/PLATELET
Basophils Absolute: 0 10*3/uL (ref 0.0–0.1)
Basophils Relative: 0 %
EOS PCT: 1 %
Eosinophils Absolute: 0.1 10*3/uL (ref 0.0–0.7)
HEMATOCRIT: 34.3 % — AB (ref 39.0–52.0)
Hemoglobin: 10.3 g/dL — ABNORMAL LOW (ref 13.0–17.0)
Lymphocytes Relative: 13 %
Lymphs Abs: 1.6 10*3/uL (ref 0.7–4.0)
MCH: 30 pg (ref 26.0–34.0)
MCHC: 30 g/dL (ref 30.0–36.0)
MCV: 100 fL (ref 78.0–100.0)
MONO ABS: 0.1 10*3/uL (ref 0.1–1.0)
MONOS PCT: 1 %
NEUTROS PCT: 85 %
Neutro Abs: 10.3 10*3/uL — ABNORMAL HIGH (ref 1.7–7.7)
PLATELETS: 230 10*3/uL (ref 150–400)
RBC: 3.43 MIL/uL — ABNORMAL LOW (ref 4.22–5.81)
RDW: 19.2 % — AB (ref 11.5–15.5)
WBC: 12.1 10*3/uL — AB (ref 4.0–10.5)

## 2015-05-02 LAB — BASIC METABOLIC PANEL
Anion gap: 16 — ABNORMAL HIGH (ref 5–15)
BUN: 54 mg/dL — AB (ref 6–20)
CALCIUM: 7.7 mg/dL — AB (ref 8.9–10.3)
CO2: 24 mmol/L (ref 22–32)
CREATININE: 5.66 mg/dL — AB (ref 0.61–1.24)
Chloride: 101 mmol/L (ref 101–111)
GFR calc Af Amer: 10 mL/min — ABNORMAL LOW (ref >60)
GFR, EST NON AFRICAN AMERICAN: 9 mL/min — AB (ref >60)
GLUCOSE: 99 mg/dL (ref 65–99)
Potassium: 3.6 mmol/L (ref 3.5–5.1)
Sodium: 141 mmol/L (ref 135–145)

## 2015-05-02 LAB — C DIFFICILE QUICK SCREEN W PCR REFLEX
C DIFFICILE (CDIFF) TOXIN: NEGATIVE
C DIFFICLE (CDIFF) ANTIGEN: NEGATIVE
C Diff interpretation: NEGATIVE

## 2015-05-02 LAB — I-STAT TROPONIN, ED: TROPONIN I, POC: 0.05 ng/mL (ref 0.00–0.08)

## 2015-05-02 LAB — I-STAT CG4 LACTIC ACID, ED: Lactic Acid, Venous: 1.81 mmol/L (ref 0.5–2.0)

## 2015-05-02 MED ORDER — ONDANSETRON HCL 4 MG/2ML IJ SOLN
4.0000 mg | Freq: Four times a day (QID) | INTRAMUSCULAR | Status: DC | PRN
  Administered 2015-05-08: 4 mg via INTRAVENOUS
  Filled 2015-05-02: qty 2

## 2015-05-02 MED ORDER — ASPIRIN EC 81 MG PO TBEC
81.0000 mg | DELAYED_RELEASE_TABLET | Freq: Every day | ORAL | Status: DC
  Administered 2015-05-02 – 2015-05-06 (×5): 81 mg via ORAL
  Filled 2015-05-02 (×6): qty 1

## 2015-05-02 MED ORDER — ONDANSETRON HCL 4 MG PO TABS: 4.0000 mg | ORAL_TABLET | Freq: Four times a day (QID) | ORAL | Status: DC | PRN

## 2015-05-02 MED ORDER — SILDENAFIL CITRATE 20 MG PO TABS
10.0000 mg | ORAL_TABLET | Freq: Three times a day (TID) | ORAL | Status: DC
  Administered 2015-05-02 – 2015-05-06 (×11): 10 mg via ORAL
  Filled 2015-05-02 (×13): qty 1

## 2015-05-02 MED ORDER — ENSURE ENLIVE PO LIQD
237.0000 mL | Freq: Two times a day (BID) | ORAL | Status: DC
  Administered 2015-05-04: 237 mL via ORAL

## 2015-05-02 MED ORDER — ACETAMINOPHEN 325 MG PO TABS
650.0000 mg | ORAL_TABLET | Freq: Four times a day (QID) | ORAL | Status: DC | PRN
  Administered 2015-05-04 – 2015-05-05 (×2): 650 mg via ORAL
  Filled 2015-05-02 (×2): qty 2

## 2015-05-02 MED ORDER — SODIUM CHLORIDE 0.9% FLUSH
3.0000 mL | Freq: Two times a day (BID) | INTRAVENOUS | Status: DC
  Administered 2015-05-02 – 2015-05-08 (×9): 3 mL via INTRAVENOUS

## 2015-05-02 MED ORDER — LEVOTHYROXINE SODIUM 50 MCG PO TABS
50.0000 ug | ORAL_TABLET | Freq: Every day | ORAL | Status: DC
  Administered 2015-05-03 – 2015-05-07 (×5): 50 ug via ORAL
  Filled 2015-05-02 (×6): qty 1

## 2015-05-02 MED ORDER — ACETAMINOPHEN 650 MG RE SUPP: 650.0000 mg | Freq: Four times a day (QID) | RECTAL | Status: DC | PRN

## 2015-05-02 MED ORDER — SODIUM CHLORIDE 0.9 % IV BOLUS (SEPSIS)
500.0000 mL | Freq: Once | INTRAVENOUS | Status: AC
  Administered 2015-05-02: 500 mL via INTRAVENOUS

## 2015-05-02 MED ORDER — MIDODRINE HCL 5 MG PO TABS
20.0000 mg | ORAL_TABLET | Freq: Three times a day (TID) | ORAL | Status: DC
  Administered 2015-05-03 – 2015-05-08 (×14): 20 mg via ORAL
  Filled 2015-05-02 (×14): qty 4

## 2015-05-02 MED ORDER — SODIUM CHLORIDE 0.9 % IV SOLN
INTRAVENOUS | Status: AC
  Administered 2015-05-02: 21:00:00 via INTRAVENOUS

## 2015-05-02 NOTE — ED Notes (Signed)
Portable at bedside 

## 2015-05-02 NOTE — Progress Notes (Signed)
Patients admitted to 3s07 with all questions answered. Admitting MD paged to update on new room no call yet.

## 2015-05-02 NOTE — ED Notes (Signed)
EKG Given to Dr. Tamera Punt

## 2015-05-02 NOTE — ED Notes (Addendum)
Per EMS, PT is coming from Dialysis. Facility reports hypotension before dialysis with systolic of 70. Pt received a full treatment and then was given 800 cc of fluid to increase BP. Pt continued to have systolic BP of [id number]. Facility called EMS. EMS gave pt 500 cc of NS. Vitals per EMS: 90/60, 90 HR, 16 RR, 112 CBG. Per PT, Pt was in the hospital two weeks ago for hypotension. Pt reports having four or more episodes of diarrhea per day since leaving the hospital. One recorded fall in the last week with healing skin tears noted to the left elbow. Denies hitting head. Reports being weak since he left the hospital. Pt ambulated to the bed with assistant. Alert and Oriented x4 Upon arrival.

## 2015-05-02 NOTE — ED Provider Notes (Addendum)
CSN: [id number]     Arrival date & time 04/14/2015  1253 History   First MD Initiated Contact with Patient 04/09/2015 1327     Chief Complaint  Patient presents with  . Hypotension     (Consider location/radiation/quality/duration/timing/severity/associated sxs/prior Treatment) HPI Comments: Patient with a history of hypertension, end-stage renal disease on dialysis, CHF, A. fib and pulmonary hypertension presents with hypotension. He was receiving his dialysis today and was noted to have a blood pressure of 70. He completed dialysis and was given an extra 800 cc of fluid to stabilize his blood pressure.  He was given an additional 500 cc of normal saline by EMS. EMS blood pressure was 90/60. He denies any symptoms. He does say that he's had some ongoing diarrhea for the last 2 weeks or so. He states that anytime he eats or drinks anything he has watery diarrhea. He typically has about 4-5 episodes a day. Because of this he states he hasn't really had a good appetite and hasn't been eating or drinking well. He was admitted for similar episode in February, one month ago. At that time he was hypotensive and initially required pressors. He had a heart catheterization which showed RV failure which was felt to be the etiology for his hypotension. He was started on Revatio 20 mg 3 times a day. He denies any dizziness. No chest pain or shortness of breath other than baseline dyspnea.   Past Medical History  Diagnosis Date  . Hypertension   . Renal disorder   . ET (essential thrombocythemia) ([place])   . Hypothyroid   . CHF (congestive heart failure) ([place])   . A-fib ([place])   . Hyperlipidemia   . Pulmonary hypertension ([place])   . Nephrolithiasis   . History of leukocytosis 2014    Chronic leukocytosis, baseline 25K approx dating back to 2014   Past Surgical History  Procedure Laterality Date  . Amputation  05/14/2011    Procedure: AMPUTATION DIGIT;  Surgeon: Dennie Bible, MD;  Location: [facility];   Service: Plastics;  Laterality: Right;  right index finger attempted revascularization  . Cardiac catheterization N/A 04/08/2015    Procedure: Right Heart Cath;  Surgeon: Larey Dresser, MD;  Location: [facility];  Service: Cardiovascular;  Laterality: N/A;   Family History  Problem Relation Age of Onset  . Cancer Mother   . Cancer Brother    Social History  Substance Use Topics  . Smoking status: Never Smoker   . Smokeless tobacco: Former Systems developer  . Alcohol Use: No    Review of Systems  Constitutional: Positive for fatigue. Negative for fever, chills and diaphoresis.  HENT: Negative for congestion, rhinorrhea and sneezing.   Eyes: Negative.   Respiratory: Positive for shortness of breath (At baseline). Negative for cough and chest tightness.   Cardiovascular: Negative for chest pain and leg swelling.  Gastrointestinal: Positive for diarrhea. Negative for nausea, vomiting, abdominal pain and blood in stool.  Genitourinary: Negative for frequency, hematuria, flank pain and difficulty urinating.  Musculoskeletal: Negative for back pain and arthralgias.  Skin: Negative for rash.  Neurological: Negative for dizziness, speech difficulty, weakness, numbness and headaches.      Allergies  Review of patient's allergies indicates no known allergies.  Home Medications   Prior to Admission medications   Medication Sig Start Date End Date Taking? Authorizing Provider  aspirin EC 81 MG tablet Take 1 tablet (81 mg total) by mouth daily. 04/16/15   Shanker Kristeen Mans, MD  levothyroxine (SYNTHROID) 50 MCG tablet Take 50 mcg by mouth daily before breakfast.    Historical Provider, MD  midodrine (PROAMATINE) 10 MG tablet Take 2 tablets (20 mg total) by mouth 3 (three) times daily with meals. 04/16/15   Shanker Kristeen Mans, MD  Nutritional Supplements (FEEDING SUPPLEMENT, NEPRO CARB STEADY,) LIQD Take 237 mLs by mouth 3 (three) times daily between meals. 04/16/15   Shanker Kristeen Mans, MD  sildenafil  (REVATIO) 20 MG tablet Take 1 tablet (20 mg total) by mouth 3 (three) times daily. 04/16/15   Shanker Kristeen Mans, MD   BP 89/70 mmHg  Pulse 62  Temp(Src) 97.4 F (36.3 C) (Oral)  Resp 15  SpO2 99% Physical Exam  Constitutional: He is oriented to person, place, and time. He appears well-developed and well-nourished.  HENT:  Head: Normocephalic and atraumatic.  Eyes: Pupils are equal, round, and reactive to light.  Neck: Normal range of motion. Neck supple.  Cardiovascular: Normal rate, regular rhythm and normal heart sounds.   Pulmonary/Chest: Effort normal and breath sounds normal. No respiratory distress. He has no wheezes. He has no rales. He exhibits no tenderness.  Abdominal: Soft. Bowel sounds are normal. There is no tenderness. There is no rebound and no guarding.  Musculoskeletal: Normal range of motion. He exhibits no edema.  Lymphadenopathy:    He has no cervical adenopathy.  Neurological: He is alert and oriented to person, place, and time.  Skin: Skin is warm and dry. No rash noted.  Psychiatric: He has a normal mood and affect.    ED Course  Procedures (including critical care time) Labs Review Labs Reviewed  CBC WITH DIFFERENTIAL/PLATELET - Abnormal; Notable for the following:    WBC 12.1 (*)    RBC 3.43 (*)    Hemoglobin 10.3 (*)    HCT 34.3 (*)    RDW 19.2 (*)    Neutro Abs 10.3 (*)    All other components within normal limits  BASIC METABOLIC PANEL - Abnormal; Notable for the following:    BUN 54 (*)    Creatinine, Ser 5.66 (*)    Calcium 7.7 (*)    GFR calc non Af Amer 9 (*)    GFR calc Af Amer 10 (*)    Anion gap 16 (*)    All other components within normal limits  C DIFFICILE QUICK SCREEN W PCR REFLEX  I-STAT TROPOININ, ED  I-STAT CG4 LACTIC ACID, ED    Imaging Review Dg Chest Port 1 View  04/27/2015  CLINICAL DATA:  Per EMS, PT is coming from Dialysis. Facility reports hypotension before dialysis with systolic of 70. Pt received a full treatment  and then was given 800 cc of fluid to increase BP. Pt continued to have systolic BP of [id number]. Facility called EMS. EMS gave pt 500 cc of NS. Pt was in the hospital two weeks ago for hypotension. Pt reports having four or more episodes of diarrhea per day since leaving the hospital as well as weakness. Hx HTN, CHF, afib EXAM: PORTABLE CHEST 1 VIEW COMPARISON:  04/14/2015 FINDINGS: Bilateral pleural effusions obscuring hemidiaphragm similar to the prior study. There is lung base opacity that is likely due to atelectasis, also similar. There is prominence of the bronchovascular markings, increased from the prior study, although this difference may be due to lower lung volumes on current exam. There is no other evidence of a change. No pneumothorax. Cardiac silhouette is normal in size.  The aorta is mildly uncoiled. IMPRESSION: 1. Similar  appearance to the previous chest radiograph when allowing for lower lung volumes on the current exam. 2. There are moderate bilateral pleural effusions with associated atelectasis. There is some prominence of the bronchovascular markings, but no overt pulmonary edema. Electronically Signed   By: Lajean Manes M.D.   On: 04/17/2015 14:53   I have personally reviewed and evaluated these images and lab results as part of my medical decision-making.   EKG Interpretation   Date/Time:  Saturday May 02 2015 15:08:21 EDT Ventricular Rate:  60 PR Interval:    QRS Duration: 112 QT Interval:  529 QTC Calculation: 529 R Axis:   115 Text Interpretation:  Right and left arm electrode reversal,  interpretation assumes no reversal Atrial fibrillation Incomplete right  bundle branch block Nonspecific T abnormalities, lateral leads Prolonged  QT interval since last tracing no significant change Confirmed by Josetta Wigal   MD, Yaakov Saindon ([id number]) on 04/19/2015 3:13:31 PM      MDM   Final diagnoses:  Hypotension, unspecified hypotension type    Patient presents with hypotension. He's been  given 800 cc of fluid by dialysis, 500 cc of fluid by EMS and an additional 500 cc of fluid by [place]. He recently had an extensive evaluation for similar presentation. He doesn't have any indication of sepsis. His lactate is normal. He doesn't have any other suggestions of infection. He does have ongoing diarrhea which likely is contributing to the hypotension. He's had episodes of watery stool in the ED. This was sent for C. difficile testing. His blood pressure initially improved to 101/61 however now it's dropped back down to 89/70. Given this I feel that we should keep him for observation and hydration. I will consult the hospitalist service for admission.  I spoke with Dr. Eulas Post who will admit the pt to stepdown.    Malvin Johns, MD 05/01/2015 [place], MD 06/12/15 1012

## 2015-05-02 NOTE — ED Notes (Signed)
IV team at bedside for removal of dialysis access

## 2015-05-02 NOTE — H&P (Signed)
[facility] History and Physical  [name] [id number] DOB: Dec 19, 1940 DOA: 05/01/2015  PCP: Raquel James   Chief Complaint: Sent to ED at the end of HD session for hypotension  HPI: Erik Ochoa is a 75 y.o. gentleman with a history of HTN, hypothyroidism, pulmonary HTN and right ventricular heart failure who was admitted earlier this month for shock.  He had an extensive evaluation that revealed his right heart failure.  He reports that he had had watery diarrhea since that admission.  He denies BRBPR.   Stool described as dark brown and watery.  He denies recent antibiotics.  He has had food aversion and decreased PO intake.  He has lost 40 lbs in the past month.  He denies loss of consciousness.  Apparently systolic BP has been in the 90's since last admission.  Today, when it dropped to the 70's after HD, he denies any associated chest tightness, shortness of breath, or lightheadedness.  No nausea or vomiting.  No known history of peptic ulcer disease or acid reflux.  BP improved with cautious IV fluid boluses in the ED.  Hospitalist asked to admit to the stepdown unit.  ED provider did not feel that cental access was warranted at this time.  Review of Systems: 12 systems reviewed and negative except as stated in the HPI.  Past Medical History  Diagnosis Date  . Hypertension   . Renal disorder   . ET (essential thrombocythemia) ([place])   . Hypothyroid   . CHF (congestive heart failure) ([place])   . A-fib ([place])   . Hyperlipidemia   . Pulmonary hypertension ([place])   . Nephrolithiasis   . History of leukocytosis 2014    Chronic leukocytosis, baseline 25K approx dating back to 2014   Past Surgical History  Procedure Laterality Date  . Amputation  05/14/2011    Procedure: AMPUTATION DIGIT;  Surgeon: Dennie Bible, MD;  Location: [facility];  Service: Plastics;  Laterality: Right;  right index finger attempted revascularization  . Cardiac catheterization N/A  04/08/2015    Procedure: Right Heart Cath;  Surgeon: Larey Dresser, MD;  Location: [facility];  Service: Cardiovascular;  Laterality: N/A;   Social History:  Social History   Social History Narrative  No tobacco, EtOH, or illicit drug use.  He is not married.  He has two adult children.  His son Erik, Ochoa, lives in [place] and is next of kin [phone]  No Known Allergies  Family History  Problem Relation Age of Onset  . Cancer Mother   . Cancer Brother   Denies any known family history of GI malignancy  Prior to Admission medications   Medication Sig Start Date End Date Taking? Authorizing Provider  levothyroxine (SYNTHROID) 50 MCG tablet Take 50 mcg by mouth daily before breakfast.   Yes Historical Provider, MD  midodrine (PROAMATINE) 5 MG tablet Take 20 mg by mouth 3 (three) times daily with meals.   Yes Historical Provider, MD  Multiple Vitamin (MULTIVITAMIN WITH MINERALS) TABS tablet Take 1 tablet by mouth daily.   Yes Historical Provider, MD  Omega 3 1000 MG CAPS Take 1,000 mg by mouth every evening.   Yes Historical Provider, MD  sildenafil (REVATIO) 20 MG tablet Take 1 tablet (20 mg total) by mouth 3 (three) times daily. Patient taking differently: Take 10 mg by mouth 3 (three) times daily.  04/16/15  Yes Shanker Kristeen Mans, MD  aspirin EC 81 MG tablet Take 1 tablet (81 mg  total) by mouth daily. Patient not taking: Reported on 04/17/2015 04/16/15   Jonetta Osgood, MD   Physical Exam: Filed Vitals:   04/13/2015 1800 05/03/2015 1849 05/06/2015 1900 04/22/2015 1925  BP: 89/58 96/60 95/58    Pulse: 55 80 75 78  Temp:  98 F (36.7 C)  97.4 F (36.3 C)  TempSrc:  Oral  Oral  Resp: 16 17 15 17   SpO2: 98% 95% 97% 95%     General:  Awake and alert.  Oriented to person, place, time and situation.  NAD.  Pale and chronically ill appearing.  ENT: Mucous membranes slightly dry.  Neck: Supple.   Cardiovascular: irregular but normal rate.  No LE edema.  Respiratory: CTA  bilaterally.  Abdomen: Soft/NT/ND.  Bowel sounds are present.  No guarding.  Skin: Warm and dry.  Musculoskeletal: Moves all four extremities spontaneously.  Psychiatric: Normal affect.  Neurologic: No focal deficits.  Labs on Admission:  Basic Metabolic Panel:  Recent Labs Lab 04/14/2015 1315  NA 141  K 3.6  CL 101  CO2 24  GLUCOSE 99  BUN 54*  CREATININE 5.66*  CALCIUM 7.7*   CBC:  Recent Labs Lab 04/21/2015 1315  WBC 12.1*  NEUTROABS 10.3*  HGB 10.3*  HCT 34.3*  MCV 100.0  PLT 230    Radiological Exams on Admission: Dg Chest Port 1 View  05/01/2015  CLINICAL DATA:  Per EMS, PT is coming from Dialysis. Facility reports hypotension before dialysis with systolic of 70. Pt received a full treatment and then was given 800 cc of fluid to increase BP. Pt continued to have systolic BP of [id number]. Facility called EMS. EMS gave pt 500 cc of NS. Pt was in the hospital two weeks ago for hypotension. Pt reports having four or more episodes of diarrhea per day since leaving the hospital as well as weakness. Hx HTN, CHF, afib EXAM: PORTABLE CHEST 1 VIEW COMPARISON:  04/14/2015 FINDINGS: Bilateral pleural effusions obscuring hemidiaphragm similar to the prior study. There is lung base opacity that is likely due to atelectasis, also similar. There is prominence of the bronchovascular markings, increased from the prior study, although this difference may be due to lower lung volumes on current exam. There is no other evidence of a change. No pneumothorax. Cardiac silhouette is normal in size.  The aorta is mildly uncoiled. IMPRESSION: 1. Similar appearance to the previous chest radiograph when allowing for lower lung volumes on the current exam. 2. There are moderate bilateral pleural effusions with associated atelectasis. There is some prominence of the bronchovascular markings, but no overt pulmonary edema. Electronically Signed   By: Lajean Manes M.D.   On: 04/15/2015 14:53    EKG:  Independently reviewed. No acute ST segment changes. Rate controlled atrial fibrillation.  Assessment/Plan Active Problems:   Shock ([place])   Hypotension   Admit to stepdown unit, telemetry  Hypovolemic shock, history of diarrhea and weight loss with poor PO intake --Cautious IV fluid resuscitation; patient is responding to IV fluid --Check orthostatics --GI pathogen panel, including C diff, pending --Dr. Michail Sermon accepted GI consult, he will see in the morning --Nutrition consult --Ensure dietary supplements between meals --Daily weights  ESRD on HD T/Th/Sat --Nephrology notified of admission but patient should not need HD until next week; will need to call back if he is still here  Code Status: FULL Family Communication: Son, Bran, Lyu, at bedside; number listed above Disposition Plan: Expect he will be here at least two midnights  Time spent:  60 minutes  [employer] [facility]  04/18/2015, 8:03 PM

## 2015-05-03 DIAGNOSIS — I959 Hypotension, unspecified: Secondary | ICD-10-CM

## 2015-05-03 DIAGNOSIS — I2729 Other secondary pulmonary hypertension: Secondary | ICD-10-CM | POA: Diagnosis present

## 2015-05-03 DIAGNOSIS — I50810 Right heart failure, unspecified: Secondary | ICD-10-CM

## 2015-05-03 DIAGNOSIS — I272 Other secondary pulmonary hypertension: Secondary | ICD-10-CM

## 2015-05-03 DIAGNOSIS — R197 Diarrhea, unspecified: Secondary | ICD-10-CM | POA: Diagnosis present

## 2015-05-03 DIAGNOSIS — E43 Unspecified severe protein-calorie malnutrition: Secondary | ICD-10-CM

## 2015-05-03 LAB — CBC
HCT: 30.6 % — ABNORMAL LOW (ref 39.0–52.0)
Hemoglobin: 9.7 g/dL — ABNORMAL LOW (ref 13.0–17.0)
MCH: 31.7 pg (ref 26.0–34.0)
MCHC: 31.7 g/dL (ref 30.0–36.0)
MCV: 100 fL (ref 78.0–100.0)
PLATELETS: 198 10*3/uL (ref 150–400)
RBC: 3.06 MIL/uL — ABNORMAL LOW (ref 4.22–5.81)
RDW: 19.5 % — AB (ref 11.5–15.5)
WBC: 10 10*3/uL (ref 4.0–10.5)

## 2015-05-03 LAB — GASTROINTESTINAL PANEL BY PCR, STOOL (REPLACES STOOL CULTURE)
ASTROVIRUS: NOT DETECTED
Adenovirus F40/41: NOT DETECTED
Campylobacter species: NOT DETECTED
Cryptosporidium: NOT DETECTED
Cyclospora cayetanensis: NOT DETECTED
E. COLI O157: NOT DETECTED
ENTAMOEBA HISTOLYTICA: NOT DETECTED
ENTEROTOXIGENIC E COLI (ETEC): NOT DETECTED
Enteroaggregative E coli (EAEC): NOT DETECTED
Enteropathogenic E coli (EPEC): NOT DETECTED
Giardia lamblia: NOT DETECTED
NOROVIRUS GI/GII: NOT DETECTED
Plesimonas shigelloides: NOT DETECTED
Rotavirus A: NOT DETECTED
SAPOVIRUS (I, II, IV, AND V): NOT DETECTED
SHIGA LIKE TOXIN PRODUCING E COLI (STEC): NOT DETECTED
Salmonella species: NOT DETECTED
Shigella/Enteroinvasive E coli (EIEC): NOT DETECTED
VIBRIO CHOLERAE: NOT DETECTED
Vibrio species: NOT DETECTED
Yersinia enterocolitica: NOT DETECTED

## 2015-05-03 LAB — BASIC METABOLIC PANEL
ANION GAP: 15 (ref 5–15)
BUN: 61 mg/dL — ABNORMAL HIGH (ref 6–20)
CALCIUM: 7.5 mg/dL — AB (ref 8.9–10.3)
CO2: 20 mmol/L — ABNORMAL LOW (ref 22–32)
Chloride: 106 mmol/L (ref 101–111)
Creatinine, Ser: 6.32 mg/dL — ABNORMAL HIGH (ref 0.61–1.24)
GFR, EST AFRICAN AMERICAN: 9 mL/min — AB (ref >60)
GFR, EST NON AFRICAN AMERICAN: 8 mL/min — AB (ref >60)
GLUCOSE: 70 mg/dL (ref 65–99)
Potassium: 4.3 mmol/L (ref 3.5–5.1)
SODIUM: 141 mmol/L (ref 135–145)

## 2015-05-03 NOTE — Progress Notes (Signed)
Utilization review completed.  

## 2015-05-03 NOTE — Consult Note (Addendum)
Referring Provider: Dr. Lily Kocher Primary Care Physician:  Raquel James Primary Gastroenterologist:  Althia Forts  Reason for Consultation:  Diarrhea; Weight loss  HPI: Erik Ochoa is a 75 y.o. male with several weeks of nonbloody loose stools that he reports is thickening up now. Denies hematochezia, melena, or rectal bleeding. Has lost over 40 pounds in the past month, and he reports that some of the weight loss was fluid weight that was intentional by his doctors. He is on dialysis and had hypotension following dialysis and sent to the ER. BP dropped into the 70's after HD and has reportedly been in the [id number] systolic. Denies chest pain, SOB, abdominal pain. Has never had a colonoscopy or EGD. C. Diff negative. GI pathogen panel pending. Hgb 9.7. Sitting in bedside chair and feels ok.   Past Medical History  Diagnosis Date  . Hypertension   . Renal disorder   . ET (essential thrombocythemia) ([place])   . Hypothyroid   . CHF (congestive heart failure) ([place])   . A-fib ([place])   . Hyperlipidemia   . Pulmonary hypertension ([place])   . Nephrolithiasis   . History of leukocytosis 2014    Chronic leukocytosis, baseline 25K approx dating back to 2014    Past Surgical History  Procedure Laterality Date  . Amputation  05/14/2011    Procedure: AMPUTATION DIGIT;  Surgeon: Dennie Bible, MD;  Location: [facility];  Service: Plastics;  Laterality: Right;  right index finger attempted revascularization  . Cardiac catheterization N/A 04/08/2015    Procedure: Right Heart Cath;  Surgeon: Larey Dresser, MD;  Location: [facility];  Service: Cardiovascular;  Laterality: N/A;    Prior to Admission medications   Medication Sig Start Date End Date Taking? Authorizing Provider  levothyroxine (SYNTHROID) 50 MCG tablet Take 50 mcg by mouth daily before breakfast.   Yes Historical Provider, MD  midodrine (PROAMATINE) 5 MG tablet Take 20 mg by mouth 3 (three) times daily with meals.   Yes  Historical Provider, MD  Multiple Vitamin (MULTIVITAMIN WITH MINERALS) TABS tablet Take 1 tablet by mouth daily.   Yes Historical Provider, MD  Omega 3 1000 MG CAPS Take 1,000 mg by mouth every evening.   Yes Historical Provider, MD  sildenafil (REVATIO) 20 MG tablet Take 1 tablet (20 mg total) by mouth 3 (three) times daily. Patient taking differently: Take 10 mg by mouth 3 (three) times daily.  04/16/15  Yes Shanker Kristeen Mans, MD  aspirin EC 81 MG tablet Take 1 tablet (81 mg total) by mouth daily. Patient not taking: Reported on 04/17/2015 04/16/15   Jonetta Osgood, MD    Scheduled Meds: . aspirin EC  81 mg Oral Daily  . feeding supplement (ENSURE ENLIVE)  237 mL Oral BID BM  . levothyroxine  50 mcg Oral QAC breakfast  . midodrine  20 mg Oral TID WC  . sildenafil  10 mg Oral 3 times per day  . sodium chloride flush  3 mL Intravenous Q12H   Continuous Infusions:  PRN Meds:.acetaminophen **OR** acetaminophen, ondansetron **OR** ondansetron (ZOFRAN) IV  Allergies as of 04/14/2015  . (No Known Allergies)    Family History  Problem Relation Age of Onset  . Cancer Mother   . Cancer Brother     Social History   Social History  . Marital Status: Unknown    Spouse Name: N/A  . Number of Children: N/A  . Years of Education: N/A   Occupational History  . Not  on file.   Social History Main Topics  . Smoking status: Never Smoker   . Smokeless tobacco: Former Systems developer  . Alcohol Use: No  . Drug Use: No  . Sexual Activity: No   Other Topics Concern  . Not on file   Social History Narrative    Review of Systems: All negative except as stated above in HPI.  Physical Exam: Vital signs: Filed Vitals:   05/03/15 0803 05/03/15 1139  BP:  94/58  Pulse:  78  Temp: 99.2 F (37.3 C) 97.5 F (36.4 C)  Resp:  29   Last BM Date: 05/03/15 General:   Lethargic, elderly, cachetic, no acute distress Head: atraumatic Eyes: anicteric sclera ENT: oropharynx clear Neck: supple,  nontender Lungs:  Clear throughout to auscultation.   No wheezes, crackles, or rhonchi. No acute distress. Heart:  Regular rate and rhythm; no murmurs, clicks, rubs,  or gallops. Abdomen: soft, nontender, nondistended, small umbilical hernia, +BS  Rectal:  Deferred Ext: no edema  GI:  Lab Results:  Recent Labs  04/11/2015 1315 05/03/15 0404  WBC 12.1* 10.0  HGB 10.3* 9.7*  HCT 34.3* 30.6*  PLT 230 198   BMET  Recent Labs  04/15/2015 1315 05/03/15 0404  NA 141 141  K 3.6 4.3  CL 101 106  CO2 24 20*  GLUCOSE 99 70  BUN 54* 61*  CREATININE 5.66* 6.32*  CALCIUM 7.7* 7.5*   LFT No results for input(s): PROT, ALBUMIN, AST, ALT, ALKPHOS, BILITOT, BILIDIR, IBILI in the last 72 hours. PT/INR No results for input(s): LABPROT, INR in the last 72 hours.   Studies/Results: Dg Chest Port 1 View  04/30/2015  CLINICAL DATA:  Per EMS, PT is coming from Dialysis. Facility reports hypotension before dialysis with systolic of 70. Pt received a full treatment and then was given 800 cc of fluid to increase BP. Pt continued to have systolic BP of [id number]. Facility called EMS. EMS gave pt 500 cc of NS. Pt was in the hospital two weeks ago for hypotension. Pt reports having four or more episodes of diarrhea per day since leaving the hospital as well as weakness. Hx HTN, CHF, afib EXAM: PORTABLE CHEST 1 VIEW COMPARISON:  04/14/2015 FINDINGS: Bilateral pleural effusions obscuring hemidiaphragm similar to the prior study. There is lung base opacity that is likely due to atelectasis, also similar. There is prominence of the bronchovascular markings, increased from the prior study, although this difference may be due to lower lung volumes on current exam. There is no other evidence of a change. No pneumothorax. Cardiac silhouette is normal in size.  The aorta is mildly uncoiled. IMPRESSION: 1. Similar appearance to the previous chest radiograph when allowing for lower lung volumes on the current exam. 2. There  are moderate bilateral pleural effusions with associated atelectasis. There is some prominence of the bronchovascular markings, but no overt pulmonary edema. Electronically Signed   By: Lajean Manes M.D.   On: 04/16/2015 14:53    Impression/Plan: 75 yo with chronic diarrhea and weight loss with anemia. No overt bleeding to suggest an active GI bleed but his history is concerning for a GI malignancy. Would recommend an EGD/colonoscopy prior to discharge and he is agreeable to proceed. Will tentatively plan to do these procedures on Wednesday 04/14/2015 and prep on 05/05/15 depending on his BPs. Start diet today and change to clear liquid diet on 05/05/15.     LOS: 1 day   [place] C.  05/03/2015, 11:55 AM  Pager [phone]  If no answer or after 5 PM call [phone]

## 2015-05-03 NOTE — Progress Notes (Signed)
[facility] PROGRESS NOTE  Erik Ochoa [id number] DOB: 1940-10-05 DOA: 04/25/2015 PCP: Raquel James  Brief narrative 75 year old male with history of  end-stage renal disease on dialysis (Tu, th, sat) ,  hypertension, A. fib not on anticoagulation, hypothyroidism, pulmonary hypertension with right ventricular heart failure was admitted with back for cardiogenic shock due to right ventricular failure. He underwent right heart cath. VQ scan showed low probability for PE. He was started on Revatio  and discharged home. Patient sent from dialysis with low systolic blood pressure in the 80s. He is also complaining of ongoing diarrhea for several weeks and significant weight loss. Admitted to stepdown unit for further monitoring.  Assessment/Plan: Hypovolemic shock Continue stepdown monitoring. Receiving IV normal saline at 100 mL per hour. blood pressure slowly improving. MAP  remains >65 -C. difficile negative. GI pathogen panel pending. -Continue midodrine.  Chronic diarrhea Stool for C. difficile negative. GI pathogen panel pending. Patient has significant weight loss (almost 40 pounds). Seen by GI and recommends EGD/colonoscopy on 3/29 once his blood pressure remains stable.  ESRD on hemodialysis Tu, th, sat Follows with Dr. Arty Baumgartner. Renal notified.  Pulmonary artery hypertension with right heart failure. Recent workup done with right heart cath swelling severely dilated RV and severe RV systolic dysfunction. Autoimmune workup was negative. VQ scan showed low probability.  A. fib Not on anticoagulation as per patient he bruises too much. Was discussed during recent hospitalization and patient wish to stay away from anticoagulation.  Chronic ascites Suspect this is due to right ventricle failure. Reportedly gets paracenteses twice a month.   Severe protein calorie calorie malnutrition  continue supplement  Hypothyroidism Continue Synthroid.   Code Status:  Full code Family Communication: None at bedside Disposition Plan: Pending clinical improvement. Will address goals of care with patient and his son. Given his severe deconditioning with underlying comorbidities he may need palliative care consult.   Consultants:  [facility]  Nephrology  Procedures:  None  Antibiotics:  None  HPI/Subjective: Seen and examined. Still having frequent diarrhea. Blood pressure improved.  Objective: Filed Vitals:   05/03/15 1139 05/03/15 1140  BP:  94/58  Pulse:  80  Temp: 97.5 F (36.4 C)   Resp:  29    Intake/Output Summary (Last 24 hours) at 05/03/15 1253 Last data filed at 05/03/15 [id number]  Gross per 24 hour  Intake   2360 ml  Output      2 ml  Net   2358 ml   Filed Weights   05/03/15 0255  Weight: 71.2 kg (156 lb 15.5 oz)    Exam:   General:  Elderly thin built and appears fatigued, not in distress  HEENT: No pallor, moist mucosa, temporal wasting  Chest: Diminished bibasilar breath sounds  Cardiovascular: Normal S1 and S2, systolic murmur 2/6  GI: Protuberant abdomen with umbilical hernia., nontender, bowel sounds present  Musculoskeletal: Warm, no edema  CNS: Alert and oriented  Data Reviewed: Basic Metabolic Panel:  Recent Labs Lab 05/04/2015 1315 05/03/15 0404  NA 141 141  K 3.6 4.3  CL 101 106  CO2 24 20*  GLUCOSE 99 70  BUN 54* 61*  CREATININE 5.66* 6.32*  CALCIUM 7.7* 7.5*   Liver Function Tests: No results for input(s): AST, ALT, ALKPHOS, BILITOT, PROT, ALBUMIN in the last 168 hours. No results for input(s): LIPASE, AMYLASE in the last 168 hours. No results for input(s): AMMONIA in the last 168 hours. CBC:  Recent Labs Lab 04/16/2015 1315 05/03/15 0404  WBC  12.1* 10.0  NEUTROABS 10.3*  --   HGB 10.3* 9.7*  HCT 34.3* 30.6*  MCV 100.0 100.0  PLT 230 198   Cardiac Enzymes: No results for input(s): CKTOTAL, CKMB, CKMBINDEX, TROPONINI in the last 168 hours. BNP (last 3 results) No results for  input(s): BNP in the last 8760 hours.  ProBNP (last 3 results) No results for input(s): PROBNP in the last 8760 hours.  CBG: No results for input(s): GLUCAP in the last 168 hours.  Recent Results (from the past 240 hour(s))  C difficile quick scan w PCR reflex     Status: None   Collection Time: 04/25/2015  3:00 PM  Result Value Ref Range Status   C Diff antigen NEGATIVE NEGATIVE Final   C Diff toxin NEGATIVE NEGATIVE Final   C Diff interpretation Negative for toxigenic C. difficile  Final     Studies: Dg Chest Port 1 View  04/12/2015  CLINICAL DATA:  Per EMS, PT is coming from Dialysis. Facility reports hypotension before dialysis with systolic of 70. Pt received a full treatment and then was given 800 cc of fluid to increase BP. Pt continued to have systolic BP of [id number]. Facility called EMS. EMS gave pt 500 cc of NS. Pt was in the hospital two weeks ago for hypotension. Pt reports having four or more episodes of diarrhea per day since leaving the hospital as well as weakness. Hx HTN, CHF, afib EXAM: PORTABLE CHEST 1 VIEW COMPARISON:  04/14/2015 FINDINGS: Bilateral pleural effusions obscuring hemidiaphragm similar to the prior study. There is lung base opacity that is likely due to atelectasis, also similar. There is prominence of the bronchovascular markings, increased from the prior study, although this difference may be due to lower lung volumes on current exam. There is no other evidence of a change. No pneumothorax. Cardiac silhouette is normal in size.  The aorta is mildly uncoiled. IMPRESSION: 1. Similar appearance to the previous chest radiograph when allowing for lower lung volumes on the current exam. 2. There are moderate bilateral pleural effusions with associated atelectasis. There is some prominence of the bronchovascular markings, but no overt pulmonary edema. Electronically Signed   By: Lajean Manes M.D.   On: 04/25/2015 14:53    Scheduled Meds: . aspirin EC  81 mg Oral Daily  .  feeding supplement (ENSURE ENLIVE)  237 mL Oral BID BM  . levothyroxine  50 mcg Oral QAC breakfast  . midodrine  20 mg Oral TID WC  . sildenafil  10 mg Oral 3 times per day  . sodium chloride flush  3 mL Intravenous Q12H   Continuous Infusions:    Time spent:35 minutes    Shonnie Poudrier, [place]  [facility] Pager [phone]. If 7PM-7AM, please contact night-coverage at [email or web address], password [facility] 05/03/2015, 12:53 PM  LOS: 1 day

## 2015-05-03 NOTE — Consult Note (Signed)
Renal Service Consult Note [facility]  Erik Ochoa 05/03/2015 [place] Requesting Physician:  Dr Clementeen Graham  Reason for Consult:  ESRD pt with chest pain HPI: The patient is a 75 y.o. year-old with hx of HTN, CHD, afib, pulm HTN and RHF and ESRD presenting sent from dialysis for hypotension.  Pt had HD yesterday rec'd full Rx and required IVF for low BP's per ED notes. Got 500 cc per EMS, in ED BP 90/60.  Ini hospital 2 wks ago for low BP.  Having diarrhea at home > 4 episodes per day. Admitted for diarrhea and hypotension.   Last admit was Feb 2017 for cardiogneic shock due to RHF.  Has known PAH, ESRD.   Patient w/o complaitns except for diarrhea, no sig abd pain.  No SOB, CP, no dialysis issues.     ROS  denies CP  no joint pain   no HA  no blurry vision  no rash  no diarrhea  no nausea/ vomiting  no dysuria  no difficulty voiding  no change in urine color    Past Medical History  Past Medical History  Diagnosis Date  . Hypertension   . Renal disorder   . ET (essential thrombocythemia) ([place])   . Hypothyroid   . CHF (congestive heart failure) ([place])   . A-fib ([place])   . Hyperlipidemia   . Pulmonary hypertension ([place])   . Nephrolithiasis   . History of leukocytosis 2014    Chronic leukocytosis, baseline 25K approx dating back to 2014   Past Surgical History  Past Surgical History  Procedure Laterality Date  . Amputation  05/14/2011    Procedure: AMPUTATION DIGIT;  Surgeon: Dennie Bible, MD;  Location: [facility];  Service: Plastics;  Laterality: Right;  right index finger attempted revascularization  . Cardiac catheterization N/A 04/08/2015    Procedure: Right Heart Cath;  Surgeon: Larey Dresser, MD;  Location: [facility];  Service: Cardiovascular;  Laterality: N/A;   Family History  Family History  Problem Relation Age of Onset  . Cancer Mother   . Cancer Brother    Social History  reports that he has never smoked. He  has quit using smokeless tobacco. He reports that he does not drink alcohol or use illicit drugs. Allergies No Known Allergies Home medications Prior to Admission medications   Medication Sig Start Date End Date Taking? Authorizing Provider  levothyroxine (SYNTHROID) 50 MCG tablet Take 50 mcg by mouth daily before breakfast.   Yes Historical Provider, MD  midodrine (PROAMATINE) 5 MG tablet Take 20 mg by mouth 3 (three) times daily with meals.   Yes Historical Provider, MD  Multiple Vitamin (MULTIVITAMIN WITH MINERALS) TABS tablet Take 1 tablet by mouth daily.   Yes Historical Provider, MD  Omega 3 1000 MG CAPS Take 1,000 mg by mouth every evening.   Yes Historical Provider, MD  sildenafil (REVATIO) 20 MG tablet Take 1 tablet (20 mg total) by mouth 3 (three) times daily. Patient taking differently: Take 10 mg by mouth 3 (three) times daily.  04/16/15  Yes Shanker Kristeen Mans, MD  aspirin EC 81 MG tablet Take 1 tablet (81 mg total) by mouth daily. Patient not taking: Reported on 04/15/2015 04/16/15   Jonetta Osgood, MD   Liver Function Tests No results for input(s): AST, ALT, ALKPHOS, BILITOT, PROT, ALBUMIN in the last 168 hours. No results for input(s): LIPASE, AMYLASE in the last 168 hours. CBC  Recent Labs Lab 04/20/2015 1315  05/03/15 0404  WBC 12.1* 10.0  NEUTROABS 10.3*  --   HGB 10.3* 9.7*  HCT 34.3* 30.6*  MCV 100.0 100.0  PLT 230 [id number]   Basic Metabolic Panel  Recent Labs Lab 04/15/2015 1315 05/03/15 0404  NA 141 141  K 3.6 4.3  CL 101 106  CO2 24 20*  GLUCOSE 99 70  BUN 54* 61*  CREATININE 5.66* 6.32*  CALCIUM 7.7* 7.5*    Filed Vitals:   05/03/15 1139 05/03/15 1140 05/03/15 1630 05/03/15 1632  BP:  94/58 93/62   Pulse:  80 65   Temp: 97.5 F (36.4 C)   97.6 F (36.4 C)  TempSrc: Oral   Oral  Resp:  29 23   Weight:      SpO2:  96% 96%    Exam Frail adult male, no distress, calm No rash, cyanosis or gangrene Sclera anicteric, throat clear  No jvd or  bruits Chest clear bilat no rales or wheezing RRR no RG loud 3/6 SEM Abd soft ntnd no mass or ascites +bs GU normal male MS no joint effusions or deformity Ext no LE or UE edema / no wounds or ulcers Neuro is alert, Ox 3 , nf LFA AV fistula +faint bruit   Dialysis: [place] [facility]  4h 33min  2/2.25 bath  LFA AVF   Hep 8400 from last admit   Assessment: 1. Hypotension - acute on chronic. On midodrine 2. Acute / chron diarrhea 3. ESRD TTS HD 4. Chronic ascites 5. R sided CHF/ pulm HTN 6. AFib , chronic 7. Anemia Hb 9.7 8. Volume -looks a bit vol depleted   Plan -  Midodrine, HD Tuesday, get records   Kelly Splinter MD [place] pager [phone]    cell [phone] 05/03/2015, 6:01 PM

## 2015-05-04 LAB — GLUCOSE, CAPILLARY: GLUCOSE-CAPILLARY: 100 mg/dL — AB (ref 65–99)

## 2015-05-04 LAB — MRSA PCR SCREENING: MRSA by PCR: NEGATIVE

## 2015-05-04 MED ORDER — ZOLPIDEM TARTRATE 5 MG PO TABS
5.0000 mg | ORAL_TABLET | Freq: Once | ORAL | Status: AC
Start: 2015-05-04 — End: 2015-05-04
  Administered 2015-05-04: 5 mg via ORAL
  Filled 2015-05-04: qty 1

## 2015-05-04 MED ORDER — SODIUM CHLORIDE 0.9 % IV SOLN
INTRAVENOUS | Status: DC
  Administered 2015-05-04: 11:00:00 via INTRAVENOUS

## 2015-05-04 MED ORDER — PEG 3350-KCL-NA BICARB-NACL 420 G PO SOLR
4000.0000 mL | Freq: Once | ORAL | Status: DC
  Filled 2015-05-04: qty 4000

## 2015-05-04 MED ORDER — NEPRO/CARBSTEADY PO LIQD
237.0000 mL | Freq: Three times a day (TID) | ORAL | Status: DC
  Administered 2015-05-04 – 2015-05-07 (×8): 237 mL via ORAL
  Filled 2015-05-04 (×4): qty 237

## 2015-05-04 NOTE — Progress Notes (Signed)
Report called to [facility] RN pt transferring to [phone] via bed with cellphone and belongings.

## 2015-05-04 NOTE — Progress Notes (Signed)
Patient ID: Erik Ochoa, male   DOB: February 29, 1940, 75 y.o.   MRN: [id number]  [facility] Progress Note    Subjective:   Feels better today   Objective:   BP 94/62 mmHg  Pulse 84  Temp(Src) 97.9 F (36.6 C) (Oral)  Resp 25  Ht 6\' 4"  (1.93 m)  Wt 73.1 kg (161 lb 2.5 oz)  BMI 19.62 kg/m2  SpO2 95%  Intake/Output: I/O last 3 completed shifts: In: 1780 [P.O.:780; I.V.:1000] Out: 2 [Stool:2]   Intake/Output this shift:  Total I/O In: 240 [P.O.:240] Out: 2 [Stool:2] Weight change: 1.9 kg (4 lb 3 oz)  Physical Exam: [id number], frail WM in NAD but appears fatigued CVS:no rub Resp:decreased BS at bases [id number], soft, +fluid wave Ext: trace pedal edema, L avf +T/B  Labs: BMET  Recent Labs Lab 05/07/2015 1315 05/03/15 0404  NA 141 141  K 3.6 4.3  CL 101 106  CO2 24 20*  GLUCOSE 99 70  BUN 54* 61*  CREATININE 5.66* 6.32*  CALCIUM 7.7* 7.5*   CBC  Recent Labs Lab 04/21/2015 1315 05/03/15 0404  WBC 12.1* 10.0  NEUTROABS 10.3*  --   HGB 10.3* 9.7*  HCT 34.3* 30.6*  MCV 100.0 100.0  PLT 230 198    @IMGRELPRIORS @ Medications:    . aspirin EC  81 mg Oral Daily  . feeding supplement (ENSURE ENLIVE)  237 mL Oral BID BM  . levothyroxine  50 mcg Oral QAC breakfast  . midodrine  20 mg Oral TID WC  . sildenafil  10 mg Oral 3 times per day  . sodium chloride flush  3 mL Intravenous Q12H   Dialysis: [place] [facility] 4h 92min 2/2.25 bath LFA AVF Hep 8400 from last admit Echo: LV EF 60-65%, D-shaped interventricular septum suggestive of RV pressure/volume overload, severely dilated RV with severe RV systolic dysfunction, PASP 59 mmHg. Moderate AS, mild MR.     Assessment/ Plan:   1. Hypotension- acute on chronic.  Multiple issues with pulmonary HTN, RV failure, and ESRD.  On large dose of midodrine but still have ascites and hypotension with UF on HD.  Feels better today.  With RV failure, very dependent upon preload so will have to be  careful with UF. 2. ESRD- continue TTS schedule, but will not pull much fluid. 3. 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 CKD-MBD:stable 5. Nutrition: protein and caloric malnutrition with bitemporal wasting.  Encourage protein intake 6. Diarrhea- chronic issue 7. Vascular access- left AVF faint thrill/bruit (presumably due to low BP). 8. Disposition- poor overall prognosis.  Would recommend palliative care to help set goals/limits of care as he is a full code at the present time.  Donetta Potts, MD [place] Pager [phone] 05/04/2015, 10:40 AM

## 2015-05-04 NOTE — Progress Notes (Signed)
Initial Nutrition Assessment  DOCUMENTATION CODES:   Severe malnutrition in context of chronic illness  INTERVENTION:    Nepro Shake po TID, each supplement provides 425 kcal and 19 grams protein  Recommend add renal MVI daily  NUTRITION DIAGNOSIS:   Malnutrition related to chronic illness as evidenced by severe depletion of body fat, severe depletion of muscle mass.  GOAL:   Patient will meet greater than or equal to 90% of their needs  MONITOR:   PO intake, Supplement acceptance, Labs, Weight trends, Skin, I & O's  REASON FOR ASSESSMENT:   Malnutrition Screening Tool, Consult Assessment of nutrition requirement/status  ASSESSMENT:   75 y.o. gentleman with a history of HTN, hypothyroidism, pulmonary HTN and right ventricular heart failure who was admitted earlier this month for shock. He had an extensive evaluation that revealed his right heart failure. He reports that he had had watery diarrhea since that admission. He denies BRBPR. Stool described as dark brown and watery. He denies recent antibiotics. He has had food aversion and decreased PO intake. He has lost 40 lbs in the past month.  Patient reports that some of his weight loss is due to fluids, "water" that was removed during last admission. Appetite is poor and intake is just okay. Consuming 25-100% of renal diet meals per documentation. Weight is currently above weight from last admission. Nutrition-Focused physical exam completed. Findings are severe fat depletion and severe muscle depletion. Patient with severe PCM. He likes Nepro supplements, but does not care for [facility].   Per chart review, GI team is concerned about potential GI malignancy. Plans for EGD/colonoscopy Wednesday. Patient to be on clear liquids starting Tuesday.   Diet Order:  Diet renal with fluid restriction Fluid restriction:: 1200 mL Fluid; Room service appropriate?: Yes; Fluid consistency:: Thin Diet clear liquid Room service  appropriate?: Yes; Fluid consistency:: Thin  Skin:  Wound (see comment) (unstageable pressure ulcer to foot)  Last BM:  3/26  Height:   Ht Readings from Last 1 Encounters:  05/04/15 6\' 4"  (1.93 m)    Weight:   Wt Readings from Last 1 Encounters:  05/04/15 161 lb 2.5 oz (73.1 kg)    Ideal Body Weight:  91.8 kg  BMI:  Body mass index is 19.62 kg/(m^2).  Estimated Nutritional Needs:   Kcal:  2200-2400  Protein:  100-120 gm  Fluid:  1.2 L  EDUCATION NEEDS:   No education needs identified at this time  Molli Barrows, [place], [place], [place] Pager [phone] After Hours Pager [phone]

## 2015-05-04 NOTE — Progress Notes (Signed)
 [facility] PROGRESS NOTE  Holly Mauch [id number] DOB: Jul 01, 1940 DOA: 05/03/2015 PCP: Raquel James  Brief narrative 75 year old male with history of  end-stage renal disease on dialysis (Tu, th, sat) ,  hypertension, A. fib not on anticoagulation, hypothyroidism, pulmonary hypertension with right ventricular heart failure was admitted with back for cardiogenic shock due to right ventricular failure. He underwent right heart cath. VQ scan showed low probability for PE. He was started on Revatio  and discharged home. Patient sent from dialysis with low systolic blood pressure in the 80s. He is also complaining of ongoing diarrhea for several weeks and significant weight loss. Admitted to stepdown unit for further monitoring.  Assessment/Plan: Hypovolemic shock Received maintenance fluids and blood pressure currently stable with MAP> 65.. -C. difficile and GI pathogen panel negative -Continue midodrine. -Transfer  to telemetry.  Chronic diarrhea Stool for C. difficile negative. GI pathogen panel pending. Patient has significant weight loss (almost 40 pounds). Seen by GI and recommends EGD/colonoscopy on 3/29 once his blood pressure remains stable.  ESRD on hemodialysis Tu, th, sat Follows with Dr. Arty Baumgartner. Given his pulmonary hypertension, RV failure and persistent hypertension with ascites with difficulty with dialysis due to hypotension, but occluded and not recommends that he has overall poor prognosis and consult palliative care for goals of care discussion.  Pulmonary artery hypertension with right heart failure. Recent workup done with right heart cath swelling severely dilated RV and severe RV systolic dysfunction. Autoimmune workup was negative. VQ scan showed low probability.  A. fib Not on anticoagulation as per patient he bruises too much. Was discussed during recent hospitalization and patient wish to stay away from anticoagulation.  Chronic  ascites Suspect this is due to right ventricle failure. Reportedly gets paracenteses twice a month.   Severe protein calorie calorie malnutrition  continue supplement  Hypothyroidism Continue Synthroid.   Code Status: Full code Family Communication: Discussed with son Disposition Plan: Transfer to telemetry. Palliative care consulted for goals of care discussion. Likely need placement.   Consultants:  [facility]ph[facility]  Procedures:  None  Antibiotics:  None  HPI/Subjective: Seen and examined. Diarrhea resolved. Blood pressure low but improved.  Objective: Filed Vitals:   05/04/15 0844 05/04/15 1100  BP:  81/67  Pulse:  80  Temp: 97.9 F (36.6 C) 97.6 F (36.4 C)  Resp:  23    Intake/Output Summary (Last 24 hours) at 05/04/15 1158 Last data filed at 05/04/15 1145  Gross per 24 hour  Intake 904.67 ml  Output      2 ml  Net 902.67 ml   Filed Weights   05/03/15 0255 05/04/15 0500 05/04/15 0800  Weight: 71.2 kg (156 lb 15.5 oz) 73.1 kg (161 lb 2.5 oz) 73.1 kg (161 lb 2.5 oz)    Exam:   General:  Elderly thin built and appears fatigued, not in distress  HEENT:  temporal wasting  Chest: Diminished bibasilar breath sounds  Cardiovascular: Normal S1 and S2, systolic murmur 2/6  GI: Protuberant abdomen with umbilical hernia., nontender, bowel sounds present  Musculoskeletal: Warm, no edema  CNS: Alert and oriented  Data Reviewed: Basic Metabolic Panel:  Recent Labs Lab 05/01/2015 1315 05/03/15 0404  NA 141 141  K 3.6 4.3  CL 101 106  CO2 24 20*  GLUCOSE 99 70  BUN 54* 61*  CREATININE 5.66* 6.32*  CALCIUM 7.7* 7.5*   Liver Function Tests: No results for input(s): AST, ALT, ALKPHOS, BILITOT, PROT, ALBUMIN in the last 168 hours. No  results for input(s): LIPASE, AMYLASE in the last 168 hours. No results for input(s): AMMONIA in the last 168 hours. CBC:  Recent Labs Lab 05/06/2015 1315 05/03/15 0404  WBC 12.1* 10.0  NEUTROABS 10.3*   --   HGB 10.3* 9.7*  HCT 34.3* 30.6*  MCV 100.0 100.0  PLT 230 198   Cardiac Enzymes: No results for input(s): CKTOTAL, CKMB, CKMBINDEX, TROPONINI in the last 168 hours. BNP (last 3 results) No results for input(s): BNP in the last 8760 hours.  ProBNP (last 3 results) No results for input(s): PROBNP in the last 8760 hours.  CBG: No results for input(s): GLUCAP in the last 168 hours.  Recent Results (from the past 240 hour(s))  C difficile quick scan w PCR reflex     Status: None   Collection Time:   3:00 PM  Result Value Ref Range Status   C Diff antigen NEGATIVE NEGATIVE Final   C Diff toxin NEGATIVE NEGATIVE Final   C Diff interpretation Negative for toxigenic C. difficile  Final  Gastrointestinal Panel by PCR , Stool     Status: None   Collection Time: 05/03/15 12:28 AM  Result Value Ref Range Status   Campylobacter species NOT DETECTED NOT DETECTED Final   Plesimonas shigelloides NOT DETECTED NOT DETECTED Final   Salmonella species NOT DETECTED NOT DETECTED Final   Yersinia enterocolitica NOT DETECTED NOT DETECTED Final   Vibrio species NOT DETECTED NOT DETECTED Final   Vibrio cholerae NOT DETECTED NOT DETECTED Final   Enteroaggregative E coli (EAEC) NOT DETECTED NOT DETECTED Final   Enteropathogenic E coli (EPEC) NOT DETECTED NOT DETECTED Final   Enterotoxigenic E coli (ETEC) NOT DETECTED NOT DETECTED Final   Shiga like toxin producing E coli (STEC) NOT DETECTED NOT DETECTED Final   E. coli O157 NOT DETECTED NOT DETECTED Final   Shigella/Enteroinvasive E coli (EIEC) NOT DETECTED NOT DETECTED Final   Cryptosporidium NOT DETECTED NOT DETECTED Final   Cyclospora cayetanensis NOT DETECTED NOT DETECTED Final   Entamoeba histolytica NOT DETECTED NOT DETECTED Final   Giardia lamblia NOT DETECTED NOT DETECTED Final   Adenovirus F40/41 NOT DETECTED NOT DETECTED Final   Astrovirus NOT DETECTED NOT DETECTED Final   Norovirus GI/GII NOT DETECTED NOT DETECTED Final    Rotavirus A NOT DETECTED NOT DETECTED Final   Sapovirus (I, II, IV, and V) NOT DETECTED NOT DETECTED Final  MRSA PCR Screening     Status: None   Collection Time: 05/04/15  7:40 AM  Result Value Ref Range Status   MRSA by PCR NEGATIVE NEGATIVE Final    Comment:        The GeneXpert MRSA Assay (FDA approved for NASAL specimens only), is one component of a comprehensive MRSA colonization surveillance program. It is not intended to diagnose MRSA infection nor to guide or monitor treatment for MRSA infections.      Studies: Dg Chest Port 1 View  05/01/2015  CLINICAL DATA:  Per EMS, PT is coming from Dialysis. Facility reports hypotension before dialysis with systolic of 70. Pt received a full treatment and then was given 800 cc of fluid to increase BP. Pt continued to have systolic BP of [id number]. Facility called EMS. EMS gave pt 500 cc of NS. Pt was in the hospital two weeks ago for hypotension. Pt reports having four or more episodes of diarrhea per day since leaving the hospital as well as weakness. Hx HTN, CHF, afib EXAM: PORTABLE CHEST 1 VIEW COMPARISON:  04/14/2015 FINDINGS: Bilateral  pleural effusions obscuring hemidiaphragm similar to the prior study. There is lung base opacity that is likely due to atelectasis, also similar. There is prominence of the bronchovascular markings, increased from the prior study, although this difference may be due to lower lung volumes on current exam. There is no other evidence of a change. No pneumothorax. Cardiac silhouette is normal in size.  The aorta is mildly uncoiled. IMPRESSION: 1. Similar appearance to the previous chest radiograph when allowing for lower lung volumes on the current exam. 2. There are moderate bilateral pleural effusions with associated atelectasis. There is some prominence of the bronchovascular markings, but no overt pulmonary edema. Electronically Signed   By: Lajean Manes M.D.   On: 05/04/2015 14:53    Scheduled Meds: . aspirin EC   81 mg Oral Daily  . feeding supplement (NEPRO CARB STEADY)  237 mL Oral TID BM  . levothyroxine  50 mcg Oral QAC breakfast  . midodrine  20 mg Oral TID WC  . [START ON 05/05/2015] polyethylene glycol-electrolytes  4,000 mL Oral Once  . sildenafil  10 mg Oral 3 times per day  . sodium chloride flush  3 mL Intravenous Q12H   Continuous Infusions: . sodium chloride 10 mL/hr at 05/04/15 1117     Time spent:25 minutes    Abran Gavigan, [place]  [facility] Pager [phone]. If 7PM-7AM, please contact night-coverage at [email or web address], password [facility] 05/04/2015, 11:58 AM  LOS: 2 days

## 2015-05-05 ENCOUNTER — Inpatient Hospital Stay (HOSPITAL_COMMUNITY): Payer: Medicare Other

## 2015-05-05 DIAGNOSIS — Z7189 Other specified counseling: Secondary | ICD-10-CM | POA: Insufficient documentation

## 2015-05-05 DIAGNOSIS — Z515 Encounter for palliative care: Secondary | ICD-10-CM | POA: Insufficient documentation

## 2015-05-05 LAB — RENAL FUNCTION PANEL
Albumin: 2.9 g/dL — ABNORMAL LOW (ref 3.5–5.0)
Anion gap: 18 — ABNORMAL HIGH (ref 5–15)
BUN: 80 mg/dL — AB (ref 6–20)
CHLORIDE: 105 mmol/L (ref 101–111)
CO2: 16 mmol/L — AB (ref 22–32)
CREATININE: 7.86 mg/dL — AB (ref 0.61–1.24)
Calcium: 7.9 mg/dL — ABNORMAL LOW (ref 8.9–10.3)
GFR calc Af Amer: 7 mL/min — ABNORMAL LOW (ref >60)
GFR, EST NON AFRICAN AMERICAN: 6 mL/min — AB (ref >60)
GLUCOSE: 66 mg/dL (ref 65–99)
Phosphorus: 7.7 mg/dL — ABNORMAL HIGH (ref 2.5–4.6)
Potassium: 4.7 mmol/L (ref 3.5–5.1)
Sodium: 139 mmol/L (ref 135–145)

## 2015-05-05 LAB — HEPATIC FUNCTION PANEL
ALT: 14 U/L — ABNORMAL LOW (ref 17–63)
AST: 18 U/L (ref 15–41)
Albumin: 2.8 g/dL — ABNORMAL LOW (ref 3.5–5.0)
Alkaline Phosphatase: 79 U/L (ref 38–126)
BILIRUBIN DIRECT: 0.2 mg/dL (ref 0.1–0.5)
BILIRUBIN INDIRECT: 0.9 mg/dL (ref 0.3–0.9)
Total Bilirubin: 1.1 mg/dL (ref 0.3–1.2)
Total Protein: 5.9 g/dL — ABNORMAL LOW (ref 6.5–8.1)

## 2015-05-05 LAB — CBC
HCT: 32.4 % — ABNORMAL LOW (ref 39.0–52.0)
Hemoglobin: 9.9 g/dL — ABNORMAL LOW (ref 13.0–17.0)
MCH: 30.4 pg (ref 26.0–34.0)
MCHC: 30.6 g/dL (ref 30.0–36.0)
MCV: 99.4 fL (ref 78.0–100.0)
PLATELETS: 230 10*3/uL (ref 150–400)
RBC: 3.26 MIL/uL — ABNORMAL LOW (ref 4.22–5.81)
RDW: 19 % — AB (ref 11.5–15.5)
WBC: 13.1 10*3/uL — AB (ref 4.0–10.5)

## 2015-05-05 LAB — BODY FLUID CELL COUNT WITH DIFFERENTIAL
Eos, Fluid: 0 %
LYMPHS FL: 56 %
MONOCYTE-MACROPHAGE-SEROUS FLUID: 40 % — AB (ref 50–90)
NEUTROPHIL FLUID: 4 % (ref 0–25)
OTHER CELLS FL: 0 %
Total Nucleated Cell Count, Fluid: 183 cu mm (ref 0–1000)

## 2015-05-05 LAB — PROTEIN, BODY FLUID: TOTAL PROTEIN, FLUID: 3.1 g/dL

## 2015-05-05 LAB — ALBUMIN, FLUID (OTHER): Albumin, Fluid: 1.9 g/dL

## 2015-05-05 MED ORDER — HEPARIN SODIUM (PORCINE) 1000 UNIT/ML DIALYSIS: 20.0000 [IU]/kg | INTRAMUSCULAR | Status: DC | PRN

## 2015-05-05 MED ORDER — PENTAFLUOROPROP-TETRAFLUOROETH EX AERO: 1.0000 "application " | INHALATION_SPRAY | CUTANEOUS | Status: DC | PRN

## 2015-05-05 MED ORDER — LIDOCAINE HCL (PF) 1 % IJ SOLN: 5.0000 mL | INTRAMUSCULAR | Status: DC | PRN

## 2015-05-05 MED ORDER — SODIUM CHLORIDE 0.9 % IV SOLN: 100.0000 mL | INTRAVENOUS | Status: DC | PRN

## 2015-05-05 MED ORDER — LIDOCAINE HCL (PF) 1 % IJ SOLN
INTRAMUSCULAR | Status: AC
  Filled 2015-05-05: qty 10

## 2015-05-05 MED ORDER — LIDOCAINE-PRILOCAINE 2.5-2.5 % EX CREA: 1.0000 "application " | TOPICAL_CREAM | CUTANEOUS | Status: DC | PRN

## 2015-05-05 MED ORDER — ZOLPIDEM TARTRATE 5 MG PO TABS
5.0000 mg | ORAL_TABLET | Freq: Once | ORAL | Status: AC
  Administered 2015-05-05: 5 mg via ORAL
  Filled 2015-05-05: qty 1

## 2015-05-05 MED ORDER — HEPARIN SODIUM (PORCINE) 1000 UNIT/ML DIALYSIS: 1000.0000 [IU] | INTRAMUSCULAR | Status: DC | PRN

## 2015-05-05 MED ORDER — ALTEPLASE 2 MG IJ SOLR: 2.0000 mg | Freq: Once | INTRAMUSCULAR | Status: DC | PRN

## 2015-05-05 NOTE — Care Management Important Message (Signed)
Important Message  Patient Details  Name: Erik Ochoa MRN: [id number] Date of Birth: May 26, 1940   Medicare Important Message Given:  Yes    Ponce Skillman P Avery 05/05/2015, 12:54 PM

## 2015-05-05 NOTE — Progress Notes (Signed)
 [facility] PROGRESS NOTE  Erik Ochoa [id number] DOB: 08-Jan-1941 DOA: 05/03/2015 PCP: Raquel James  Brief narrative 75 year old male with history of  end-stage renal disease on dialysis (Tu, th, sat) ,  hypertension, A. fib not on anticoagulation, hypothyroidism, pulmonary hypertension with right ventricular heart failure was admitted with back for cardiogenic shock due to right ventricular failure. He underwent right heart cath. VQ scan showed low probability for PE. He was started on Revatio  and discharged home. Patient sent from dialysis with low systolic blood pressure in the 80s. He is also complaining of ongoing diarrhea for several weeks and significant weight loss. Admitted to stepdown unit , then transferred to telemetry.  Assessment/Plan: Hypovolemic shock Received maintenance fluids , pressure remains low. -C. difficile and GI pathogen panel negative -Continue midodrine.   Chronic diarrhea Stool for C. difficile and GI pathogen panel negative. Reports significant weight loss (almost 40 pounds). Seen by GI who recommended EGD/colonoscopy on 3/29 . Patient does not want to have a procedure now. Procedure canceled.  Chronic ascites Suspected due to right ventricular failure. Gets paracenteses twice a month. Order for therapeutic paracentesis.  ESRD on hemodialysis Tu, th, sat Follows with Dr. Arty Baumgartner. Does not have a good overall prognosis. Given his severe pulmonary hypertension with RV failure with severe hypotension limiting dialysis, with both recommend palliative care discussion.  Pulmonary artery hypertension with right heart failure. Recent workup done with right heart cath swelling severely dilated RV and severe RV systolic dysfunction. Autoimmune workup was negative. VQ scan showed low probability.  A. fib Not on anticoagulation as per patient he bruises too much. Was discussed during recent hospitalization and patient wish to stay away from  anticoagulation.   Severe protein calorie calorie malnutrition  continue supplement  Hypothyroidism Continue Synthroid.   Code Status: Full code Family Communication: Son at bedside Disposition Plan: Palliative care consulted for goals of care discussion. Overall prognosis is very poor. Patient is in denial and once aggressive care for now. Son understands patient's overall poor prognosis and is supportive.  Consultants:  [facility]  Procedures:  None  Antibiotics:  None  HPI/Subjective: Seen and examined. Dialysis was interrupted as the venous needle infiltrated. Blood pressure remains low.  Objective: Filed Vitals:   05/05/15 1422 05/05/15 1426  BP: 88/52 85/54  Pulse:    Temp:    Resp:      Intake/Output Summary (Last 24 hours) at 05/05/15 1454 Last data filed at 05/05/15 1012  Gross per 24 hour  Intake 398.33 ml  Output      1 ml  Net 397.33 ml   Filed Weights   05/04/15 2039 05/05/15 0708 05/05/15 0805  Weight: 58.5 kg (128 lb 15.5 oz) 72.2 kg (159 lb 2.8 oz) 72.2 kg (159 lb 2.8 oz)    Exam:   General:   fatigued, not in distress  HEENT:  temporal wasting  Chest: Diminished bibasilar breath sounds  Cardiovascular: Normal S1 and S2, systolic murmur 2/6  GI: Abdominal distended with ascites, umbilical hernia, nontender, bowel sounds present  Musculoskeletal: Warm, no edema  CNS: Alert and oriented  Data Reviewed: Basic Metabolic Panel:  Recent Labs Lab 04/28/2015 1315 05/03/15 0404 05/05/15 0821  NA 141 141 139  K 3.6 4.3 4.7  CL 101 106 105  CO2 24 20* 16*  GLUCOSE 99 70 66  BUN 54* 61* 80*  CREATININE 5.66* 6.32* 7.86*  CALCIUM 7.7* 7.5* 7.9*  PHOS  --   --  7.7*  Liver Function Tests:  Recent Labs Lab 05/05/15 0821 05/05/15 1058  AST  --  18  ALT  --  14*  ALKPHOS  --  79  BILITOT  --  1.1  PROT  --  5.9*  ALBUMIN 2.9* 2.8*   No results for input(s): LIPASE, AMYLASE in the last 168 hours. No results  for input(s): AMMONIA in the last 168 hours. CBC:  Recent Labs Lab 04/18/2015 1315 05/03/15 0404 05/05/15 0821  WBC 12.1* 10.0 13.1*  NEUTROABS 10.3*  --   --   HGB 10.3* 9.7* 9.9*  HCT 34.3* 30.6* 32.4*  MCV 100.0 100.0 99.4  PLT 230 198 230   Cardiac Enzymes: No results for input(s): CKTOTAL, CKMB, CKMBINDEX, TROPONINI in the last 168 hours. BNP (last 3 results) No results for input(s): BNP in the last 8760 hours.  ProBNP (last 3 results) No results for input(s): PROBNP in the last 8760 hours.  CBG:  Recent Labs Lab 05/04/15 1706  GLUCAP 100*    Recent Results (from the past 240 hour(s))  C difficile quick scan w PCR reflex     Status: None   Collection Time:   3:00 PM  Result Value Ref Range Status   C Diff antigen NEGATIVE NEGATIVE Final   C Diff toxin NEGATIVE NEGATIVE Final   C Diff interpretation Negative for toxigenic C. difficile  Final  Gastrointestinal Panel by PCR , Stool     Status: None   Collection Time: 05/03/15 12:28 AM  Result Value Ref Range Status   Campylobacter species NOT DETECTED NOT DETECTED Final   Plesimonas shigelloides NOT DETECTED NOT DETECTED Final   Salmonella species NOT DETECTED NOT DETECTED Final   Yersinia enterocolitica NOT DETECTED NOT DETECTED Final   Vibrio species NOT DETECTED NOT DETECTED Final   Vibrio cholerae NOT DETECTED NOT DETECTED Final   Enteroaggregative E coli (EAEC) NOT DETECTED NOT DETECTED Final   Enteropathogenic E coli (EPEC) NOT DETECTED NOT DETECTED Final   Enterotoxigenic E coli (ETEC) NOT DETECTED NOT DETECTED Final   Shiga like toxin producing E coli (STEC) NOT DETECTED NOT DETECTED Final   E. coli O157 NOT DETECTED NOT DETECTED Final   Shigella/Enteroinvasive E coli (EIEC) NOT DETECTED NOT DETECTED Final   Cryptosporidium NOT DETECTED NOT DETECTED Final   Cyclospora cayetanensis NOT DETECTED NOT DETECTED Final   Entamoeba histolytica NOT DETECTED NOT DETECTED Final   Giardia lamblia NOT  DETECTED NOT DETECTED Final   Adenovirus F40/41 NOT DETECTED NOT DETECTED Final   Astrovirus NOT DETECTED NOT DETECTED Final   Norovirus GI/GII NOT DETECTED NOT DETECTED Final   Rotavirus A NOT DETECTED NOT DETECTED Final   Sapovirus (I, II, IV, and V) NOT DETECTED NOT DETECTED Final  MRSA PCR Screening     Status: None   Collection Time: 05/04/15  7:40 AM  Result Value Ref Range Status   MRSA by PCR NEGATIVE NEGATIVE Final    Comment:        The GeneXpert MRSA Assay (FDA approved for NASAL specimens only), is one component of a comprehensive MRSA colonization surveillance program. It is not intended to diagnose MRSA infection nor to guide or monitor treatment for MRSA infections.      Studies: No results found.  Scheduled Meds: . aspirin EC  81 mg Oral Daily  . feeding supplement (NEPRO CARB STEADY)  237 mL Oral TID BM  . levothyroxine  50 mcg Oral QAC breakfast  . lidocaine (PF)      .  midodrine  20 mg Oral TID WC  . sildenafil  10 mg Oral 3 times per day  . sodium chloride flush  3 mL Intravenous Q12H   Continuous Infusions: . sodium chloride 10 mL/hr at 05/04/15 1117     Time spent:25 minutes    Jaymes Revels, [place]  [facility] Pager [phone]. If 7PM-7AM, please contact night-coverage at [email or web address], password [facility] 05/05/2015, 2:54 PM  LOS: 3 days

## 2015-05-05 NOTE — Progress Notes (Signed)
Patient ID: Isabelle Hedding, male   DOB: 04-03-1940, 75 y.o.   MRN: [id number] [facility] Progress Note  Randal Wiesehan 75 y.o. 1940/08/18   Subjective: Complains of abdominal distention and pain. Does not feel up to drinking colon prep because of his ascites. Feels he needs a paracentesis. Denies N/V.  Objective: Vital signs in last 24 hours: Filed Vitals:   05/05/15 0708 05/05/15 0800  BP: 95/57 84/54  Pulse: 96 78  Temp: 97 F (36.1 C)   Resp: 19     Physical Exam: Gen: lethargic, elderly, thin HEENT: anicteric sclera CV: RRR Chest: CTA B Abd: distended without tenderness, umbilical hernia, decreased bowel sounds  Lab Results:  Recent Labs  04/26/2015 1315 05/03/15 0404  NA 141 141  K 3.6 4.3  CL 101 106  CO2 24 20*  GLUCOSE 99 70  BUN 54* 61*  CREATININE 5.66* 6.32*  CALCIUM 7.7* 7.5*   No results for input(s): AST, ALT, ALKPHOS, BILITOT, PROT, ALBUMIN in the last 72 hours.  Recent Labs  04/12/2015 1315 05/03/15 0404 05/05/15 0821  WBC 12.1* 10.0 13.1*  NEUTROABS 10.3*  --   --   HGB 10.3* 9.7* 9.9*  HCT 34.3* 30.6* 32.4*  MCV 100.0 100.0 99.4  PLT 230 198 230   No results for input(s): LABPROT, INR in the last 72 hours.    Assessment/Plan: Chronic diarrhea and weight loss with anemia with worsening ascites - will order a diagnostic paracentesis. Cancel colon/EGD for now (patient not willing to do prep at this time). Supportive care. Will follow.   [place] C. 05/05/2015, 10:46 AM  Pager [phone]  If no answer or after 5 PM call [phone]

## 2015-05-05 NOTE — Consult Note (Signed)
Consultation Note Date: 05/05/2015   Patient Name: Erik Ochoa  DOB: 11-Dec-1940  MRN: [id number]  Age / Sex: 75 y.o., male  PCP: Erik Ochoa Referring Physician: Louellen Molder, MD  Reason for Consultation: Establishing goals of care  74 yo with ESRD, afib (no on anticoagulation) and PHTN with right heart failure, who receives paracentesis 2x per month was readmitted on 3/25 with hypotension and diarrhea.  He underwent right heart cath on 04/08/15 afterward he developed hypotension and was subsequently admitted with cardiogenic shock.  Epic notes indicate the patient has lost 40 lbs in the past month.  He is being evaluated by gastroenterology for weight loss and chronic diarrhea.  Clinical Assessment/Narrative: Mr. Erik Ochoa served in the [employer] and had several active duty posts including [employer], [place].  He is also an Education administrator.  He lives alone.  His son and chosen Cooper, Erik Ochoa, lives in [place].  Mr. Erik Ochoa is an independent proud man.  He tells me he understands his medical condition but has faith that he will receive a miracle and his health will improve.    Mr. Erik Ochoa tells me that he will not die because he has not fulfilled his purpose on Earth yet.  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  I told Mr. Erik Ochoa that I supported him and hoped that he would have that time and energy.  In the mean time however we should make plans for the immediate future.  Mr. Erik Ochoa plans to discharge to an SNF for rehab.  We discussed the possibility of engaging Hospice services when rehab was finished or when it became no longer an option.  We talked about a time when his blood pressure would prohibit him from hemo dialysis - that his time would be very short - likely a maximum of two weeks.  He would like to engage residential Hospice [facility]) at that time.    Including scope of care and  planning for his death.  After some conversation with both the patient and his son it becomes clear the patient will need Residential Hospice during his final days.   The patient admitted that he struggles with loneliness.  It is clear that Mr. Erik Ochoa will to live is very strong.  However, his nutritional intake and his functional status is very poor and he is progressively becoming weaker.  I fear his time is very short.  I asked him to prioritize what he needed to get done so that we could focus on the things that are most important to him.  Erik Ochoa (Son) understands his fathers status and is realistic about it.  Erik Ochoa is concerned that his father is unwilling to accept his fate. Privately,  I counsel Erik Ochoa not to fight that battle as it would take care of itself, rather he should spend his efforts loving and caring for his father.   Contacts/Participants in Discussion: Patient and son Magazine features editor: patient HCPOA:  Yes, Erik Ochoa (Son)   SUMMARY OF RECOMMENDATIONS  Code Status/Advance Care Planning: DNR  = I will continue to have conversations with Mr. Tetter regarding code status on 3/29   Palliative Prophylaxis:   Frequent Pain Assessment  Additional Recommendations (Limitations, Scope, Preferences):  Full Scope Treatment    Psycho-social/Spiritual:  Support System: Poor Desire for further Chaplaincy support: No.   Additional Recommendations: Caregiving  Support/Resources and Education on Hospice  Prognosis:  Less than 6 weeks  Discharge Planning: To  be determined.   Chief Complaint/ Primary Diagnoses: Present on Admission:  . Hypotension . Shock ([place]) . ESRD (end stage renal disease) ([place]) . Protein-calorie malnutrition, severe . Diarrhea . 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 The following aspects are pertinent.  Past Medical History    Diagnosis Date  . Hypertension   . Renal disorder   . ET (essential thrombocythemia) ([place])   . Hypothyroid   . CHF (congestive heart failure) ([place])   . A-fib ([place])   . Hyperlipidemia   . Pulmonary hypertension ([place])   . Nephrolithiasis   . History of leukocytosis 2014    Chronic leukocytosis, baseline 25K approx dating back to 2014   Social History   Social History  . Marital Status: Unknown    Spouse Name: N/A  . Number of Children: N/A  . Years of Education: N/A   Social History Main Topics  . Smoking status: Never Smoker   . Smokeless tobacco: Former Systems developer  . Alcohol Use: No  . Drug Use: No  . Sexual Activity: No   Other Topics Concern  . None   Social History Narrative   Family History  Problem Relation Age of Onset  . Cancer Mother   . Cancer Brother    Scheduled Meds: . aspirin EC  81 mg Oral Daily  . feeding supplement (NEPRO CARB STEADY)  237 mL Oral TID BM  . levothyroxine  50 mcg Oral QAC breakfast  . lidocaine (PF)      . midodrine  20 mg Oral TID WC  . sildenafil  10 mg Oral 3 times per day  . sodium chloride flush  3 mL Intravenous Q12H   Continuous Infusions: . sodium chloride 10 mL/hr at 05/04/15 1117   PRN Meds:.sodium chloride, sodium chloride, acetaminophen **OR** acetaminophen, alteplase, heparin, heparin, lidocaine (PF), lidocaine-prilocaine, ondansetron **OR** ondansetron (ZOFRAN) IV, pentafluoroprop-tetrafluoroeth Medications Prior to Admission:  Prior to Admission medications   Medication Sig Start Date End Date Taking? Authorizing Provider  levothyroxine (SYNTHROID) 50 MCG tablet Take 50 mcg by mouth daily before breakfast.   Yes Historical Provider, MD  midodrine (PROAMATINE) 5 MG tablet Take 20 mg by mouth 3 (three) times daily with meals.   Yes Historical Provider, MD  Multiple Vitamin (MULTIVITAMIN WITH MINERALS) TABS tablet Take 1 tablet by mouth daily.   Yes Historical Provider, MD  Omega 3 1000 MG CAPS Take 1,000 mg by mouth  every evening.   Yes Historical Provider, MD  sildenafil (REVATIO) 20 MG tablet Take 1 tablet (20 mg total) by mouth 3 (three) times daily. Patient taking differently: Take 10 mg by mouth 3 (three) times daily.  04/16/15  Yes Shanker Kristeen Mans, MD  aspirin EC 81 MG tablet Take 1 tablet (81 mg total) by mouth daily. Patient not taking: Reported on 05/05/2015 04/16/15   Jonetta Osgood, MD   No Known Allergies  Review of Systems  Reports loneliness, loss of appetite, abdominal pain, weakness  Physical Exam  Wd, cachectic, chronically ill appear male, lying in bed, appears weak CV:  irreg ++ systolic murmur Resp:  No increased work of breathing Abd:  Disproportionately larger than the rest of his frame, +Fluid shift Skin:  Multiple bruises  Vital Signs: BP 86/51 mmHg  Pulse 91  Temp(Src) 98.3 F (36.8 C) (Oral)  Resp 16  Ht 6\' 4"  (1.93 m)  Wt 72.2 kg (159 lb  2.8 oz)  BMI 19.38 kg/m2  SpO2 92%  SpO2: SpO2: 92 % O2 Device:SpO2: 92 % O2 Flow Rate: .   IO: Intake/output summary:  Intake/Output Summary (Last 24 hours) at 05/05/15 1924 Last data filed at 05/05/15 1903  Gross per 24 hour  Intake 278.33 ml  Output      1 ml  Net 277.33 ml    LBM: Last BM Date: 05/05/15 Baseline Weight: Weight: 71.2 kg (156 lb 15.5 oz) Most recent weight: Weight: 72.2 kg (159 lb 2.8 oz)      Palliative Assessment/Data:  Flowsheet Rows        Most Recent Value   Intake Tab    Referral Department  Hospitalist   Unit at Time of Referral  Med/Surg Unit   Palliative Care Primary Diagnosis  Cardiac   Date Notified  05/04/15   Palliative Care Type  New Palliative care   Reason for referral  Clarify Goals of Care, Non-pain Symptom   Date of Admission  04/28/2015   Date first seen by Palliative Care  05/05/15   # of days Palliative referral response time  1 Day(s)   # of days IP prior to Palliative referral  2   Clinical Assessment    Palliative Performance Scale Score  30%   Pain Max last 24 hours   4   Pain Min Last 24 hours  1   Psychosocial & Spiritual Assessment    Palliative Care Outcomes    Patient/Family meeting held?  Yes   Who was at the meeting?  Patient and son Erik Ochoa   Palliative Care Outcomes  Clarified goals of care      Additional Data Reviewed:  CBC:    Component Value Date/Time   WBC 13.1* 05/05/2015 0821   WBC 13.3* 05/18/2011 1404   HGB 9.9* 05/05/2015 0821   HGB 14.1 05/18/2011 1404   HCT 32.4* 05/05/2015 0821   HCT 44.4 05/18/2011 1404   PLT 230 05/05/2015 0821   PLT 361 05/18/2011 1404   MCV 99.4 05/05/2015 0821   MCV 85.1 05/18/2011 1404   NEUTROABS 10.3* 04/08/2015 1315   NEUTROABS 11.4* 05/18/2011 1404   LYMPHSABS 1.6 04/10/2015 1315   LYMPHSABS 1.2 05/18/2011 1404   MONOABS 0.1 04/17/2015 1315   MONOABS 0.4 05/18/2011 1404   EOSABS 0.1 04/22/2015 1315   EOSABS 0.2 05/18/2011 1404   BASOSABS 0.0 04/28/2015 1315   BASOSABS 0.1 05/18/2011 1404   Comprehensive Metabolic Panel:    Component Value Date/Time   NA 139 05/05/2015 0821   K 4.7 05/05/2015 0821   CL 105 05/05/2015 0821   CO2 16* 05/05/2015 0821   BUN 80* 05/05/2015 0821   CREATININE 7.86* 05/05/2015 0821   GLUCOSE 66 05/05/2015 0821   CALCIUM 7.9* 05/05/2015 0821   AST 18 05/05/2015 1058   ALT 14* 05/05/2015 1058   ALKPHOS 79 05/05/2015 1058   BILITOT 1.1 05/05/2015 1058   PROT 5.9* 05/05/2015 1058   ALBUMIN 2.8* 05/05/2015 1058     Time In: 4:00 Time Out: 5:10 Time Total: 70 min Greater than 50%  of this time was spent counseling and coordinating care related to the above assessment and plan.  Signed by:  Imogene Burn, PA-C Palliative Medicine Pager: [phone]  05/05/2015, 7:24 PM  Please contact Palliative Medicine Team phone at [phone] for questions and concerns.

## 2015-05-05 NOTE — Procedures (Signed)
Successful US guided paracentesis from LLQ.  Yielded 3L of clear yellow fluid.  No immediate complications.  Pt tolerated well.   Specimen was sent for labs.  Ascencion Dike PA-C 05/05/2015 2:46 PM

## 2015-05-05 NOTE — Procedures (Signed)
Patient was seen on dialysis and the procedure was supervised. BFR 200 Via LAVF BP is 91/55, however his venous needle infiltrated and new system is being set up as the system clotted while readjusting the venous needle.  New system being set up.  Patient appears to be tolerating treatment well although we are not challenging his edw due to issues with hypotension.  He is not in respiratory distress but has a very small window of fluid removal and symptomatic hypotension.

## 2015-05-06 ENCOUNTER — Encounter (HOSPITAL_COMMUNITY): Admission: EM | Disposition: E | Payer: Self-pay | Source: Home / Self Care | Attending: Internal Medicine

## 2015-05-06 DIAGNOSIS — R197 Diarrhea, unspecified: Secondary | ICD-10-CM

## 2015-05-06 DIAGNOSIS — Z66 Do not resuscitate: Secondary | ICD-10-CM | POA: Insufficient documentation

## 2015-05-06 DIAGNOSIS — R188 Other ascites: Secondary | ICD-10-CM

## 2015-05-06 DIAGNOSIS — I9589 Other hypotension: Secondary | ICD-10-CM

## 2015-05-06 DIAGNOSIS — N186 End stage renal disease: Secondary | ICD-10-CM

## 2015-05-06 LAB — RENAL FUNCTION PANEL
Albumin: 2.6 g/dL — ABNORMAL LOW (ref 3.5–5.0)
Anion gap: 18 — ABNORMAL HIGH (ref 5–15)
BUN: 94 mg/dL — AB (ref 6–20)
CHLORIDE: 104 mmol/L (ref 101–111)
CO2: 19 mmol/L — AB (ref 22–32)
CREATININE: 8.91 mg/dL — AB (ref 0.61–1.24)
Calcium: 7.7 mg/dL — ABNORMAL LOW (ref 8.9–10.3)
GFR calc Af Amer: 6 mL/min — ABNORMAL LOW (ref >60)
GFR, EST NON AFRICAN AMERICAN: 5 mL/min — AB (ref >60)
Glucose, Bld: 83 mg/dL (ref 65–99)
Phosphorus: 7.9 mg/dL — ABNORMAL HIGH (ref 2.5–4.6)
Potassium: 4.6 mmol/L (ref 3.5–5.1)
Sodium: 141 mmol/L (ref 135–145)

## 2015-05-06 LAB — CBC
HCT: 28.4 % — ABNORMAL LOW (ref 39.0–52.0)
Hemoglobin: 8.7 g/dL — ABNORMAL LOW (ref 13.0–17.0)
MCH: 30.5 pg (ref 26.0–34.0)
MCHC: 30.6 g/dL (ref 30.0–36.0)
MCV: 99.6 fL (ref 78.0–100.0)
PLATELETS: 254 10*3/uL (ref 150–400)
RBC: 2.85 MIL/uL — ABNORMAL LOW (ref 4.22–5.81)
RDW: 18.8 % — AB (ref 11.5–15.5)
WBC: 10.7 10*3/uL — AB (ref 4.0–10.5)

## 2015-05-06 SURGERY — ESOPHAGOGASTRODUODENOSCOPY (EGD) WITH PROPOFOL
Anesthesia: Monitor Anesthesia Care | Laterality: Left

## 2015-05-06 NOTE — Progress Notes (Signed)
Patient ID: Erik Ochoa, male   DOB: 01/18/1941, 75 y.o.   MRN: [id number] [facility] Gastroenterology Progress Note  Cyris Lecher 75 y.o. 20-Nov-1940   Subjective: Lethargic. Resting in bed but arousable. Oriented X 3.  Objective: Vital signs in last 24 hours: Filed Vitals:   04/30/2015 0730 04/28/2015 0804  BP: 82/62 87/58  Pulse: 83 75  Temp:  97.2  Resp: 13 14    Physical Exam: Gen: lethargic, no acute distress HEENT: anicteric CV: RRR Chest: CTA B Abd: distended, nontender, +BS  Lab Results:  Recent Labs  05/05/15 0821 04/30/2015 0811  NA 139 141  K 4.7 4.6  CL 105 104  CO2 16* 19*  GLUCOSE 66 83  BUN 80* 94*  CREATININE 7.86* 8.91*  CALCIUM 7.9* 7.7*  PHOS 7.7* 7.9*    Recent Labs  05/05/15 1058 05/05/2015 0811  AST 18  --   ALT 14*  --   ALKPHOS 79  --   BILITOT 1.1  --   PROT 5.9*  --   ALBUMIN 2.8* 2.6*    Recent Labs  05/05/15 0821 04/28/2015 0811  WBC 13.1* 10.7*  HGB 9.9* 8.7*  HCT 32.4* 28.4*  MCV 99.4 99.6  PLT 230 254   No results for input(s): LABPROT, INR in the last 72 hours.    Assessment/Plan: Ascites with recent paracentesis and ascitic fluid consistent with heart failure source. Would not recommend an EGD/colon due to his debilitated condition. Suspect his anemia is multi-factorial. Advance diet. Will sign off. F/U with GI not needed.   [place] C. 04/24/2015, 12:04 PM  Pager [phone]  If no answer or after 5 PM call [phone]

## 2015-05-06 NOTE — Progress Notes (Signed)
Daily Progress Note   Patient Name: Erik Ochoa       Date: 04/14/2015 DOB: Oct 26, 1940  Age: 75 y.o. MRN#: [id number] Attending Physician: Robbie Lis, MD Primary Care Physician: Raquel James Admit Date: 04/14/2015  Reason for Consultation/Follow-up: Establishing goals of care  75 yo male with right heart failure and pulmonary HTN, as well as ESRD.  He has had a 58 lb weight loss in the last year.   Subjective: Erik Ochoa reports feeling better today.    Interval Events:  After a discussion about code blue with Erik Ochoa and his son, the patient comments that her would never want to be put thru a code blue.  He had a friend that was resuscitated only to die again 3 days later, and he would never want to go thru that pain and expense.   We discussed Advanced Care Directives.  The patient wants to formally name his son Erik Ochoa as his HCPOA.  I have left the paper work for them to complete and have requested that a Chaplain assist them and coordinate the notarization.  Erik Ochoa plan is to go to skilled rehab after discharge to work on getting stronger.  We have discussed taking advantage of hospice services when he is no longer in acute rehab.  Erik Ochoa (Son) believes that his father would be best served going directly into Hospice, but the patient is not mentally prepared to make that move.    I have expressed (hopefully accurately) to Erik Ochoa that he could qualify for hospice services at home due to his heart failure and still be allowed to receive hemo dialysis.  I plan to review the MOST form with him tomorrow - specifically the option for comfort measures only without return to the hospital.    Length of Stay: 4 days  Current Medications: Scheduled Meds:  . aspirin  EC  81 mg Oral Daily  . feeding supplement (NEPRO CARB STEADY)  237 mL Oral TID BM  . levothyroxine  50 mcg Oral QAC breakfast  . midodrine  20 mg Oral TID WC  . sildenafil  10 mg Oral 3 times per day  . sodium chloride flush  3 mL Intravenous Q12H    Continuous Infusions: . sodium chloride 10 mL/hr at 05/04/15 1117    PRN Meds:  acetaminophen **OR** acetaminophen, ondansetron **OR** ondansetron (ZOFRAN) IV   Physical Exam   Wd, cachectic, chronically ill appear male, lying in bed, appears weak, multiple missing teeth CV: irreg ++ systolic murmur Resp: No increased work of breathing Abd: Disproportionately larger than the rest of his frame, +Fluid shift Skin: Multiple bruises             Vital Signs: BP 87/58 mmHg  Pulse 75  Temp(Src) 97.2 F (36.2 C) (Oral)  Resp 14  Ht 6\' 4"  (1.93 m)  Wt 68.9 kg (151 lb 14.4 oz)  BMI 18.50 kg/m2  SpO2 95% SpO2: SpO2: 95 % O2 Device: O2 Device: Not Delivered O2 Flow Rate:    Intake/output summary:  Intake/Output Summary (Last 24 hours) at 04/13/2015 1339 Last data filed at 04/09/2015 1037  Gross per 24 hour  Intake    360 ml  Output      0 ml  Net    360 ml   LBM: Last BM Date: 04/25/2015 Baseline Weight: Weight: 71.2 kg (156 lb 15.5 oz) Most recent weight: Weight: 68.9 kg (151 lb 14.4 oz)       Palliative Assessment/Data: Flowsheet Rows        Most Recent Value   Intake Tab    Referral Department  Hospitalist   Unit at Time of Referral  Med/Surg Unit   Palliative Care Primary Diagnosis  Cardiac   Date Notified  05/04/15   Palliative Care Type  New Palliative care   Reason for referral  Clarify Goals of Care, Non-pain Symptom   Date of Admission  04/22/2015   Date first seen by Palliative Care  05/05/15   # of days Palliative referral response time  1 Day(s)   # of days IP prior to Palliative referral  2   Clinical Assessment    Palliative Performance Scale Score  30%   Pain Max last 24 hours  4   Pain Min Last 24 hours   1   Psychosocial & Spiritual Assessment    Palliative Care Outcomes    Patient/Family meeting held?  Yes   Who was at the meeting?  patient and son   Palliative Care Outcomes  Completed durable DNR, Changed CPR status, Provided advance care planning      Additional Data Reviewed: CBC    Component Value Date/Time   WBC 10.7* 04/14/2015 0811   WBC 13.3* 05/18/2011 1404   RBC 2.85* 04/24/2015 0811   RBC 5.22 05/18/2011 1404   HGB 8.7* 04/08/2015 0811   HGB 14.1 05/18/2011 1404   HCT 28.4* 05/05/2015 0811   HCT 44.4 05/18/2011 1404   PLT 254 05/05/2015 0811   PLT 361 05/18/2011 1404   MCV 99.6 04/21/2015 0811   MCV 85.1 05/18/2011 1404   MCH 30.5 05/01/2015 0811   MCH 27.0* 05/18/2011 1404   MCHC 30.6 04/08/2015 0811   MCHC 31.8* 05/18/2011 1404   RDW 18.8* 04/30/2015 0811   RDW 16.0* 05/18/2011 1404   LYMPHSABS 1.6 05/05/2015 1315   LYMPHSABS 1.2 05/18/2011 1404   MONOABS 0.1 04/29/2015 1315   MONOABS 0.4 05/18/2011 1404   EOSABS 0.1 04/13/2015 1315   EOSABS 0.2 05/18/2011 1404   BASOSABS 0.0 05/07/2015 1315   BASOSABS 0.1 05/18/2011 1404    CMP     Component Value Date/Time   NA 141 04/26/2015 0811   K 4.6 04/27/2015 0811   CL 104 05/04/2015 0811   CO2 19* 04/11/2015 0811   GLUCOSE 83 04/12/2015 [id number]  BUN 94* 04/08/2015 0811   CREATININE 8.91* 04/27/2015 0811   CALCIUM 7.7* 04/28/2015 0811   PROT 5.9* 05/05/2015 1058   ALBUMIN 2.6* 04/17/2015 0811   AST 18 05/05/2015 1058   ALT 14* 05/05/2015 1058   ALKPHOS 79 05/05/2015 1058   BILITOT 1.1 05/05/2015 1058   GFRNONAA 5* 04/20/2015 0811   GFRAA 6* 05/04/2015 0811       Problem List:  Patient Active Problem List   Diagnosis Date Noted  . Palliative care encounter   . Goals of care, counseling/discussion   . Encounter for hospice care discussion   . Arterial hypotension 05/03/2015  . Diarrhea 05/03/2015  . 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 Hypotension 05/05/2015    . Acute respiratory failure ([place])   . Ascites   . Adrenal insufficiency ([place]) 04/12/2015  . Liver cirrhosis ([place]) 04/12/2015  . Protein-calorie malnutrition, severe 04/11/2015  . Encounter for central line placement   . Pleural effusion   . Acute pulmonary edema ([facility])   . Left upper extremity swelling   . Pressure ulcer 04/04/2015  . Sepsis ([place]) 04/03/2015  . ESRD (end stage renal disease) ([place])   . Shock ([place])   . Gout flare 05/20/2012  . Chronic kidney disease (CKD), stage IV (severe) ([place]) 05/19/2012  . Polyarthralgia 05/18/2012  . Volume overload 05/18/2012  . Hyponatremia 05/17/2012  . Hypokalemia 05/17/2012  . Leukocytosis 05/17/2012  . Pulmonary hypertension ([place])   . Hyperlipidemia   . CHF (congestive heart failure) ([place])   . Hypothyroid   . ET (essential thrombocythemia) ([place])   . Hypertension      Palliative Care Assessment & Plan    1.Code Status: DNR 2. Goals of Care/Additional Recommendations:  Recommending Hospice Services and Comfort Measures.  Need to continue discussions with the patient.   5. Prognosis:  Less than 6 weeks if he continues HD.  Less than 1 week if he stopped HD.  6. Discharge Planning:  [place] for rehab with Palliative care service follow-up.  To be followed by Hospice Care.   Care plan was discussed with son Erik Ochoa and Dr. Marval Regal.  Thank you for allowing the Palliative Medicine Team to assist in the care of this patient.   Time In: 1:00 Time Out: 2:00 Total Time 60 min Prolonged Time Billed no      Imogene Burn, [place] Palliative Medicine Pager: [phone]   04/26/2015, 1:39 PM  Please contact Palliative Medicine Team phone at [phone] for questions and concerns.

## 2015-05-06 NOTE — Progress Notes (Signed)
Patient ID: Erik Ochoa, male   DOB: 1940/04/20, 75 y.o.   MRN: [id number]  [facility] Progress Note    Subjective:   No new complaints   Objective:   BP 89/58 mmHg  Pulse 92  Temp(Src) 97.2 F (36.2 C) (Oral)  Resp 13  Ht 6\' 4"  (1.93 m)  Wt 68.9 kg (151 lb 14.4 oz)  BMI 18.50 kg/m2  SpO2 95%  Intake/Output: I/O last 3 completed shifts: In: 278.3 [P.O.:120; I.V.:158.3] Out: 1 [Stool:1]   Intake/Output this shift:    Weight change: -0.9 kg (-1 lb 15.8 oz)  Physical Exam: [id number], cachectic, WM in NAD, flat affect  CVS:no rub Resp:decreased BS at bases [id number], NT/ND Ext:no edema, LAVF with bruit at arterial anastamosis but no thrill/bruit along venous drainage.  Labs: BMET  Recent Labs Lab 04/17/2015 1315 05/03/15 0404 05/05/15 0821 05/05/15 1058  NA 141 141 139  --   K 3.6 4.3 4.7  --   CL 101 106 105  --   CO2 24 20* 16*  --   GLUCOSE 99 70 66  --   BUN 54* 61* 80*  --   CREATININE 5.66* 6.32* 7.86*  --   ALBUMIN  --   --  2.9* 2.8*  CALCIUM 7.7* 7.5* 7.9*  --   PHOS  --   --  7.7*  --    CBC  Recent Labs Lab 04/28/2015 1315 05/03/15 0404 05/05/15 0821  WBC 12.1* 10.0 13.1*  NEUTROABS 10.3*  --   --   HGB 10.3* 9.7* 9.9*  HCT 34.3* 30.6* 32.4*  MCV 100.0 100.0 99.4  PLT 230 198 230    @IMGRELPRIORS @ Medications:    . aspirin EC  81 mg Oral Daily  . feeding supplement (NEPRO CARB STEADY)  237 mL Oral TID BM  . levothyroxine  50 mcg Oral QAC breakfast  . midodrine  20 mg Oral TID WC  . sildenafil  10 mg Oral 3 times per day  . sodium chloride flush  3 mL Intravenous Q12H   Dialysis: [facility] TTS 4h 51min 2/2.25 bath LFA AVF Hep 8400 from last admit Echo: LV EF 60-65%, D-shaped interventricular septum suggestive of RV pressure/volume overload, severely dilated RV with severe RV systolic dysfunction, PASP 59 mmHg. Moderate AS, mild MR.   Assessment/ Plan:   1. Hypotension- acute on chronic. Multiple issues  with pulmonary HTN, RV failure, and ESRD. On large dose of midodrine but still have ascites and hypotension with UF on HD. Feels better today. With RV failure, very dependent upon preload so will have to be careful with UF. 2. ESRD- continue TTS schedule, but will not pull much fluid. 3. 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 CKD-MBD:stable 5. Nutrition: protein and caloric malnutrition with bitemporal wasting. Encourage protein intake 6. Diarrhea- chronic issue 7. Vascular access- left AVF with proximal thrill and bruit but not along venous drainage, likely with clot (has h/o stensosis requiring PTA).  Will consult IR for declot/angioplasty. 8. Ascites- s/p paracentesis yesterday and feels better. 9. Disposition- poor overall prognosis. Would recommend palliative care to help set goals/limits of care as he is a full code at the present time.  Donetta Potts, MD [place] Pager [phone] 04/18/2015, 8:24 AM

## 2015-05-06 NOTE — Progress Notes (Signed)
 Patient ID: Erik Ochoa, male   DOB: 17-Nov-1940, 75 y.o.   MRN: [id number] [facility] PROGRESS NOTE  Erik Ochoa [id number] DOB: 1940/10/26 DOA: 04/25/2015 PCP: Raquel James  Brief narrative:    75 year old male with history of end-stage renal disease on dialysis (Tu, th, sat) , hypertension, A. 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  Assessment/Plan:    Hypovolemic shock - Hypotensive while on maintenance fluids - Continue midodrine  - PCT for goals of care consulted, appreciate their team following  - PT eval pending   Chronic diarrhea - Stool for C. difficile and GI pathogen panel negative.  - Seen by GI who recommended EGD/colonoscopy on 3/29 but pt decline procedures   Chronic ascites - Gets paracenteses twice a month. Order for therapeutic paracentesis. - Recent paracentesis 3/28 with 3 L fluid removed   ESRD on hemodialysis Tu, th, sat - Per renal   Pulmonary artery hypertension with right heart failure. - Recent workup done with right heart cath swelling severely dilated RV and severe RV systolic dysfunction.  A. Fib - CHADS vasc score 4 - Not on AC due to overall easy bruising  Severe protein calorie calorie malnutrition  - In the context of chronic illness - Diet as tolerated   Hypothyroidism - Continue Synthroid.  DVT Prophylaxis  - SCD's bilaterally   Code Status: DNR/DNI  Family Communication:  plan of care discussed with the patient Disposition Plan: possibly by 3/31 if remains stable   IV access:  Peripheral IV  Procedures and diagnostic studies:    US Paracentesis 05/05/2015  Successful ultrasound-guided paracentesis yielding 3 liters of peritoneal fluid. Read by: Ascencion Dike PA-C Electronically Signed   By: Jacqulynn Cadet M.D.   On: 05/05/2015 14:48   Dg Chest Port 1 View 04/15/2015  1.  Similar appearance to the previous chest radiograph when allowing for lower lung volumes on the current exam. 2. There are moderate bilateral pleural effusions with associated atelectasis. There is some prominence of the bronchovascular markings, but no overt pulmonary edema. Electronically Signed   By: Lajean Manes M.D.   On: 04/17/2015 14:53   Medical Consultants:  GI Nephrology PCT  IAnti-Infectives:   None   Leisa Lenz, MD  [facility] Pager [phone]  Time spent in minutes: 25 minutes  If 7PM-7AM, please contact night-coverage [email or web address] Password [email or web address] 04/10/2015, 7:00 PM   LOS: 4 days    HPI/Subjective: No acute overnight events. Patient reports he feels tired.  Objective: Filed Vitals:   04/10/2015 0705 05/07/2015 0730 04/08/2015 0804 04/26/2015 1741  BP: 80/60 82/62 87/58  91/55  Pulse: 84 83 75 82  Temp:    97.7 F (36.5 C)  TempSrc:    Oral  Resp: 14 13 14 16   Height:      Weight:      SpO2:    93%    Intake/Output Summary (Last 24 hours) at 05/03/2015 1900 Last data filed at 04/18/2015 1037  Gross per 24 hour  Intake    360 ml  Output      0 ml  Net    360 ml    Exam:   General:  Pt is alert, follows commands appropriately, not in acute distress  Cardiovascular: Regular rate and rhythm, S1/S2 appreciated   Respiratory: diminished bilaterally, no wheezing, no crackles, no rhonchi  Abdomen: Soft, non tender, non distended, bowel sounds present  Extremities: No cyanosis,  pulses palpable bilaterally  Neuro: Grossly nonfocal  Data Reviewed: Basic Metabolic Panel:  Recent Labs Lab 04/18/2015 1315 05/03/15 0404 05/05/15 0821 04/15/2015 0811  NA 141 141 139 141  K 3.6 4.3 4.7 4.6  CL 101 106 105 104  CO2 24 20* 16* 19*  GLUCOSE 99 70 66 83  BUN 54* 61* 80* 94*  CREATININE 5.66* 6.32* 7.86* 8.91*  CALCIUM 7.7* 7.5* 7.9* 7.7*  PHOS  --   --  7.7* 7.9*   Liver Function Tests:  Recent Labs Lab 05/05/15 0821 05/05/15 1058 04/28/2015 0811   AST  --  18  --   ALT  --  14*  --   ALKPHOS  --  79  --   BILITOT  --  1.1  --   PROT  --  5.9*  --   ALBUMIN 2.9* 2.8* 2.6*   No results for input(s): LIPASE, AMYLASE in the last 168 hours. No results for input(s): AMMONIA in the last 168 hours. CBC:  Recent Labs Lab 04/09/2015 1315 05/03/15 0404 05/05/15 0821  0811  WBC 12.1* 10.0 13.1* 10.7*  NEUTROABS 10.3*  --   --   --   HGB 10.3* 9.7* 9.9* 8.7*  HCT 34.3* 30.6* 32.4* 28.4*  MCV 100.0 100.0 99.4 99.6  PLT 230 198 230 254   Cardiac Enzymes: No results for input(s): CKTOTAL, CKMB, CKMBINDEX, TROPONINI in the last 168 hours. BNP: Invalid input(s): POCBNP CBG:  Recent Labs Lab 05/04/15 1706  GLUCAP 100*    Recent Results (from the past 240 hour(s))  C difficile quick scan w PCR reflex     Status: None   Collection Time: 04/30/2015  3:00 PM  Result Value Ref Range Status   C Diff antigen NEGATIVE NEGATIVE Final   C Diff toxin NEGATIVE NEGATIVE Final   C Diff interpretation Negative for toxigenic C. difficile  Final  Gastrointestinal Panel by PCR , Stool     Status: None   Collection Time: 05/03/15 12:28 AM  Result Value Ref Range Status   Campylobacter species NOT DETECTED NOT DETECTED Final   Plesimonas shigelloides NOT DETECTED NOT DETECTED Final   Salmonella species NOT DETECTED NOT DETECTED Final   Yersinia enterocolitica NOT DETECTED NOT DETECTED Final   Vibrio species NOT DETECTED NOT DETECTED Final   Vibrio cholerae NOT DETECTED NOT DETECTED Final   Enteroaggregative E coli (EAEC) NOT DETECTED NOT DETECTED Final   Enteropathogenic E coli (EPEC) NOT DETECTED NOT DETECTED Final   Enterotoxigenic E coli (ETEC) NOT DETECTED NOT DETECTED Final   Shiga like toxin producing E coli (STEC) NOT DETECTED NOT DETECTED Final   E. coli O157 NOT DETECTED NOT DETECTED Final   Shigella/Enteroinvasive E coli (EIEC) NOT DETECTED NOT DETECTED Final   Cryptosporidium NOT DETECTED NOT DETECTED Final   Cyclospora  cayetanensis NOT DETECTED NOT DETECTED Final   Entamoeba histolytica NOT DETECTED NOT DETECTED Final   Giardia lamblia NOT DETECTED NOT DETECTED Final   Adenovirus F40/41 NOT DETECTED NOT DETECTED Final   Astrovirus NOT DETECTED NOT DETECTED Final   Norovirus GI/GII NOT DETECTED NOT DETECTED Final   Rotavirus A NOT DETECTED NOT DETECTED Final   Sapovirus (I, II, IV, and V) NOT DETECTED NOT DETECTED Final  MRSA PCR Screening     Status: None   Collection Time: 05/04/15  7:40 AM  Result Value Ref Range Status   MRSA by PCR NEGATIVE NEGATIVE Final    Comment:        The  GeneXpert MRSA Assay (FDA approved for NASAL specimens only), is one component of a comprehensive MRSA colonization surveillance program. It is not intended to diagnose MRSA infection nor to guide or monitor treatment for MRSA infections.      Scheduled Meds: . aspirin EC  81 mg Oral Daily  . feeding supplement (NEPRO CARB STEADY)  237 mL Oral TID BM  . levothyroxine  50 mcg Oral QAC breakfast  . midodrine  20 mg Oral TID WC  . sildenafil  10 mg Oral 3 times per day  . sodium chloride flush  3 mL Intravenous Q12H   Continuous Infusions: . sodium chloride 10 mL/hr at 05/04/15 1117

## 2015-05-07 DIAGNOSIS — I482 Chronic atrial fibrillation: Secondary | ICD-10-CM

## 2015-05-07 DIAGNOSIS — E875 Hyperkalemia: Secondary | ICD-10-CM

## 2015-05-07 LAB — CBC
HCT: 21.6 % — ABNORMAL LOW (ref 39.0–52.0)
Hemoglobin: 6.7 g/dL — CL (ref 13.0–17.0)
MCH: 30.6 pg (ref 26.0–34.0)
MCHC: 31 g/dL (ref 30.0–36.0)
MCV: 98.6 fL (ref 78.0–100.0)
PLATELETS: 383 10*3/uL (ref 150–400)
RBC: 2.19 MIL/uL — ABNORMAL LOW (ref 4.22–5.81)
RDW: 19.2 % — AB (ref 11.5–15.5)
WBC: 14.5 10*3/uL — AB (ref 4.0–10.5)

## 2015-05-07 LAB — RENAL FUNCTION PANEL
ALBUMIN: 2 g/dL — AB (ref 3.5–5.0)
ANION GAP: 17 — AB (ref 5–15)
BUN: 113 mg/dL — ABNORMAL HIGH (ref 6–20)
CO2: 18 mmol/L — ABNORMAL LOW (ref 22–32)
Calcium: 7.3 mg/dL — ABNORMAL LOW (ref 8.9–10.3)
Chloride: 107 mmol/L (ref 101–111)
Creatinine, Ser: 9.35 mg/dL — ABNORMAL HIGH (ref 0.61–1.24)
GFR calc non Af Amer: 5 mL/min — ABNORMAL LOW (ref >60)
GFR, EST AFRICAN AMERICAN: 6 mL/min — AB (ref >60)
GLUCOSE: 86 mg/dL (ref 65–99)
PHOSPHORUS: 8 mg/dL — AB (ref 2.5–4.6)
POTASSIUM: 5.4 mmol/L — AB (ref 3.5–5.1)
Sodium: 142 mmol/L (ref 135–145)

## 2015-05-07 LAB — PREPARE RBC (CROSSMATCH)

## 2015-05-07 MED ORDER — CALCIUM ACETATE (PHOS BINDER) 667 MG PO CAPS
667.0000 mg | ORAL_CAPSULE | Freq: Three times a day (TID) | ORAL | Status: DC
  Administered 2015-05-07: 667 mg via ORAL
  Filled 2015-05-07 (×2): qty 1

## 2015-05-07 MED ORDER — PANTOPRAZOLE SODIUM 40 MG IV SOLR
40.0000 mg | Freq: Two times a day (BID) | INTRAVENOUS | Status: DC
  Administered 2015-05-07 – 2015-05-08 (×2): 40 mg via INTRAVENOUS
  Filled 2015-05-07 (×2): qty 40

## 2015-05-07 MED ORDER — SODIUM CHLORIDE 0.9 % IV SOLN: Freq: Once | INTRAVENOUS | Status: DC

## 2015-05-07 MED ORDER — SILDENAFIL CITRATE 20 MG PO TABS: 10.0000 mg | ORAL_TABLET | Freq: Every day | ORAL | Status: DC

## 2015-05-07 MED ORDER — DARBEPOETIN ALFA 150 MCG/0.3ML IJ SOSY
150.0000 ug | PREFILLED_SYRINGE | INTRAMUSCULAR | Status: DC
  Filled 2015-05-07: qty 0.3

## 2015-05-07 MED ORDER — SODIUM POLYSTYRENE SULFONATE 15 GM/60ML PO SUSP: 15.0000 g | Freq: Once | ORAL | Status: DC

## 2015-05-07 NOTE — Clinical Social Work Note (Signed)
Clinical Social Work Assessment  Patient Details  Name: Erik Ochoa MRN: [id number] Date of Birth: [birth year]  Date of referral:  05/07/15               Reason for consult:  Facility Placement, Discharge Planning                Permission sought to share information with:  Facility Art therapist granted to share information::  Yes, Verbal Permission Granted  Name::     Erik Ochoa::     Relationship::  Friend   Contact Information:  [phone]  Housing/Transportation Living arrangements for the past 2 months:  Single Family Home Source of Information:  Patient Patient Interpreter Needed:  None Criminal Activity/Legal Involvement Pertinent to Current Situation/Hospitalization:  No - Comment as needed Significant Relationships:  Friend Lives with:  Self Do you feel safe going back to the place where you live?  Yes Need for family participation in patient care:  Yes (Comment)  Care giving concerns:  Not discussed   Social Worker assessment / plan:  Patient is oriented x 4, and was alert during room visit. CSW spoke with patient regarding his discharge plans. CSW explained to patient that his MD was recommending him receive short term rehab at a SNF. Patient was in full agreement. CSW explained the process of sending out pt's information to facilities. Erik Ochoa had no preferences for facilities, so a list of facilities were provided to the patient in the room. At this time there are no further questions or concerns at this time.   Employment status:  Retired Forensic scientist:  Medicare PT Recommendations:  Not assessed at this time Information / Referral to community resources:  [place]  Patient/Family's Response to care:  Not discussed   Patient/Family's Understanding of and Emotional Response to Diagnosis, Current Treatment, and Prognosis:  Not discussed   Emotional Assessment Appearance:  Appears stated  age Attitude/Demeanor/Rapport:    Affect (typically observed):  Calm, Accepting, Appropriate Orientation:  Oriented to Self, Oriented to Place, Oriented to  Time, Oriented to Situation Alcohol / Substance use:  Never Used Psych involvement (Current and /or in the community):  No (Comment)  Discharge Needs  Concerns to be addressed:  Discharge Planning Concerns Readmission within the last 30 days:  No Current discharge risk:  Other (Deconditioned ) Barriers to Discharge:  No Barriers Identified   Erik Ochoa, Student-SW 05/07/2015, 1:56 PM

## 2015-05-07 NOTE — Progress Notes (Signed)
Patient ID: Erik Ochoa, male   DOB: 1940/11/01, 75 y.o.   MRN: [id number] Patient evaluated for clotted dialysis fistula.  The left wrist fistula appears to be occluded a few cm beyond the arterial anastomosis.  Patient is hypotensive with GI bleeding.  Hypotension has likely contributed to the occluded fistula.  Based on patient's GI bleeding, anemia and hypotension, he is not a good candidate for declot procedure at this time.  Discussed with Dr. Marval Regal and plan for a HD catheter on 3/31.

## 2015-05-07 NOTE — Progress Notes (Signed)
PT Cancellation Note  Patient Details Name: Erik Ochoa MRN: [id number] DOB: 07/07/1940   Cancelled Treatment:    Reason Eval/Treat Not Completed: Medical issues which prohibited therapy RN reports pt with bloody stools this AM and downward trending BP (most recent recording 80/45). Will hold comprehensive PT evaluation at this time. We will follow-up likely tomorrow AM.  [name] 05/07/2015, 9:40 AM [name], [place]

## 2015-05-07 NOTE — Progress Notes (Signed)
Patient ID: Erik Ochoa, male   DOB: [birth year], 75 y.o.   MRN: [id number] [facility] PROGRESS NOTE  Erik Ochoa [id number] DOB: 22-Jul-1940 DOA: 04/14/2015 PCP: Raquel James  Brief narrative:    75 year old male with history of end-stage renal disease on dialysis (Tu, th, sat) , hypertension, A. 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  Assessment/Plan:    Hypovolemic shock - Hypotensive while on maintenance fluids - Continue midodrine  - Will decrease sildenafil to once a day instead of TID due to hypotension - PCT following, appreciate their assistance   Acute blood loss anemia - Hgb 6.7 this am - Lower GI bleed - Will get 2 U PRBC, with HD - Check CBC in am  Chronic diarrhea - Stool for C. difficile and GI pathogen panel negative.  - Seen by GI who recommended EGD/colonoscopy on 3/29 but pt decline procedures   Chronic ascites - Gets paracenteses twice a month. Order for therapeutic paracentesis. - Recent paracentesis 3/28 with 3 L fluid removed   ESRD on hemodialysis Tu, th, sat / Hyperkalemia  - Per renal  - Will get HD  Pulmonary artery hypertension with right heart failure. - Recent workup done with right heart cath swelling severely dilated RV and severe RV systolic dysfunction.  A. Fib - CHADS vasc score 4 - Not on AC due to overall easy bruising  Severe protein calorie calorie malnutrition  - In the context of chronic illness - Diet as tolerated   Hypothyroidism - Continue Synthroid.  DVT Prophylaxis  - SCD's bilaterally due to risk of bleeding   Code Status: DNR/DNI  Family Communication:  plan of care discussed with the patient Disposition Plan: possibly by 4/1.  IV access:  Peripheral IV  Procedures and diagnostic studies:    US Paracentesis 05/05/2015  Successful ultrasound-guided paracentesis  yielding 3 liters of peritoneal fluid. Read by: Ascencion Dike PA-C Electronically Signed   By: Jacqulynn Cadet M.D.   On: 05/05/2015 14:48   Dg Chest Port 1 View 05/03/2015  1. Similar appearance to the previous chest radiograph when allowing for lower lung volumes on the current exam. 2. There are moderate bilateral pleural effusions with associated atelectasis. There is some prominence of the bronchovascular markings, but no overt pulmonary edema. Electronically Signed   By: Lajean Manes M.D.   On: 04/15/2015 14:53   Medical Consultants:  GI Nephrology PCT  IAnti-Infectives:   None   Leisa Lenz, MD  [facility] Pager [phone]  Time spent in minutes: 25 minutes  If 7PM-7AM, please contact night-coverage [email or web address] Password [facility] 05/07/2015, 3:55 PM   LOS: 5 days    HPI/Subjective: Had blood in stool yesterday and this am.  Objective: Filed Vitals:   05/07/15 0400 05/07/15 0807 05/07/15 1430 05/07/15 1500  BP: 91/56 80/45 64/40  62/40  Pulse: 91 85 63 86  Temp: 98 F (36.7 C) 98.3 F (36.8 C) 97.6 F (36.4 C) 97.6 F (36.4 C)  TempSrc: Oral Oral Oral Oral  Resp:  14 16 16   Height:      Weight: 70 kg (154 lb 5.2 oz)     SpO2: 92% 94% 94% 96%    Intake/Output Summary (Last 24 hours) at 05/07/15 1555 Last data filed at 05/07/15 1503  Gross per 24 hour  Intake     18 ml  Output      1 ml  Net  17 ml    Exam:   General:  Pt is alert, not in acute distress  Cardiovascular: RRR, S1/S2 (+)  Respiratory: No wheezing, no crackles, no rhonchi  Abdomen: (+) BS, non tender   Extremities: No edema, palpable pulses   Neuro: Nonfocal  Data Reviewed: Basic Metabolic Panel:  Recent Labs Lab 04/18/2015 1315 05/03/15 0404 05/05/15 0821 04/10/2015 0811 05/07/15 1221  NA 141 141 139 141 142  K 3.6 4.3 4.7 4.6 5.4*  CL 101 106 105 104 107  CO2 24 20* 16* 19* 18*  GLUCOSE 99 70 66 83 86  BUN 54* 61* 80* 94* 113*  CREATININE 5.66* 6.32* 7.86*  8.91* 9.35*  CALCIUM 7.7* 7.5* 7.9* 7.7* 7.3*  PHOS  --   --  7.7* 7.9* 8.0*   Liver Function Tests:  Recent Labs Lab 05/05/15 0821 05/05/15 1058 04/23/2015 0811 05/07/15 1221  AST  --  18  --   --   ALT  --  14*  --   --   ALKPHOS  --  79  --   --   BILITOT  --  1.1  --   --   PROT  --  5.9*  --   --   ALBUMIN 2.9* 2.8* 2.6* 2.0*   No results for input(s): LIPASE, AMYLASE in the last 168 hours. No results for input(s): AMMONIA in the last 168 hours. CBC:  Recent Labs Lab 04/08/2015 1315 05/03/15 0404 05/05/15 0821 04/19/2015 0811 05/07/15 1030  WBC 12.1* 10.0 13.1* 10.7* 14.5*  NEUTROABS 10.3*  --   --   --   --   HGB 10.3* 9.7* 9.9* 8.7* 6.7*  HCT 34.3* 30.6* 32.4* 28.4* 21.6*  MCV 100.0 100.0 99.4 99.6 98.6  PLT 230 198 230 254 383   Cardiac Enzymes: No results for input(s): CKTOTAL, CKMB, CKMBINDEX, TROPONINI in the last 168 hours. BNP: Invalid input(s): POCBNP CBG:  Recent Labs Lab 05/04/15 1706  GLUCAP 100*    Recent Results (from the past 240 hour(s))  C difficile quick scan w PCR reflex     Status: None   Collection Time: 04/09/2015  3:00 PM  Result Value Ref Range Status   C Diff antigen NEGATIVE NEGATIVE Final   C Diff toxin NEGATIVE NEGATIVE Final   C Diff interpretation Negative for toxigenic C. difficile  Final  Gastrointestinal Panel by PCR , Stool     Status: None   Collection Time: 05/03/15 12:28 AM  Result Value Ref Range Status   Campylobacter species NOT DETECTED NOT DETECTED Final   Plesimonas shigelloides NOT DETECTED NOT DETECTED Final   Salmonella species NOT DETECTED NOT DETECTED Final   Yersinia enterocolitica NOT DETECTED NOT DETECTED Final   Vibrio species NOT DETECTED NOT DETECTED Final   Vibrio cholerae NOT DETECTED NOT DETECTED Final   Enteroaggregative E coli (EAEC) NOT DETECTED NOT DETECTED Final   Enteropathogenic E coli (EPEC) NOT DETECTED NOT DETECTED Final   Enterotoxigenic E coli (ETEC) NOT DETECTED NOT DETECTED Final    Shiga like toxin producing E coli (STEC) NOT DETECTED NOT DETECTED Final   E. coli O157 NOT DETECTED NOT DETECTED Final   Shigella/Enteroinvasive E coli (EIEC) NOT DETECTED NOT DETECTED Final   Cryptosporidium NOT DETECTED NOT DETECTED Final   Cyclospora cayetanensis NOT DETECTED NOT DETECTED Final   Entamoeba histolytica NOT DETECTED NOT DETECTED Final   Giardia lamblia NOT DETECTED NOT DETECTED Final   Adenovirus F40/41 NOT DETECTED NOT DETECTED Final   Astrovirus NOT  DETECTED NOT DETECTED Final   Norovirus GI/GII NOT DETECTED NOT DETECTED Final   Rotavirus A NOT DETECTED NOT DETECTED Final   Sapovirus (I, II, IV, and V) NOT DETECTED NOT DETECTED Final  MRSA PCR Screening     Status: None   Collection Time: 05/04/15  7:40 AM  Result Value Ref Range Status   MRSA by PCR NEGATIVE NEGATIVE Final    Comment:        The GeneXpert MRSA Assay (FDA approved for NASAL specimens only), is one component of a comprehensive MRSA colonization surveillance program. It is not intended to diagnose MRSA infection nor to guide or monitor treatment for MRSA infections.      Scheduled Meds: . sodium chloride   Intravenous Once  . aspirin EC  81 mg Oral Daily  . calcium acetate  667 mg Oral TID WC  . darbepoetin (ARANESP) injection - DIALYSIS  150 mcg Intravenous Q Thu-HD  . feeding supplement (NEPRO CARB STEADY)  237 mL Oral TID BM  . levothyroxine  50 mcg Oral QAC breakfast  . midodrine  20 mg Oral TID WC  . pantoprazole (PROTONIX) IV  40 mg Intravenous Q12H  . sildenafil  10 mg Oral 3 times per day  . sodium chloride flush  3 mL Intravenous Q12H  . sodium polystyrene  15 g Oral Once   Continuous Infusions: . sodium chloride 10 mL/hr at 05/04/15 1117

## 2015-05-07 NOTE — Progress Notes (Signed)
05/07/2015 9:32 AM  Patient has has two bloody stools this AM. Sample saved for MD viewing. MD Charlies Silvers was made aware at bedside. New orders written. Will continue to assess and monitor the patient.   [employer], RN-BC, [facility] [facility] [address] Phone [phone]

## 2015-05-07 NOTE — Progress Notes (Signed)
 Subjective:  No cos/ Per IR dept "lunch time declott eval of AVF" Vs perm cath   Objective Vital signs in last 24 hours: Filed Vitals:   04/16/2015 1741  2029 05/07/15 0400 05/07/15 0807  BP: 91/55 90/55 91/56  80/45  Pulse: 82 99 91 85  Temp: 97.7 F (36.5 C) 97.9 F (36.6 C) 98 F (36.7 C) 98.3 F (36.8 C)  TempSrc: Oral Oral Oral Oral  Resp: 16 16  14   Height:      Weight:   70 kg (154 lb 5.2 oz)   SpO2: 93% 92% 92% 94%   Weight change: -2.2 kg (-4 lb 13.6 oz)  Physical Exam: General: alert / thin /Frail elderly WM  NAD Heart: RRR. No rub or gallop Lungs: CTA except decr at bases Abdomen: soft , Nt, ND Extremities:no pedal edema  Dialysis Access: LFA AVF bruit only at Arterial Anastomosis otherwise no bruit  Down avf   OP Dialysis: [facility] 4h 79min 2/2.25 bath LFA AVF Hep 8400 from last admit Echo: LV EF 60-65%, D-shaped interventricular septum suggestive of RV pressure/volume overload, severely dilated RV with severe RV systolic dysfunction, PASP 59 mmHg. Moderate AS, mild MR  Problem/Plan: 1. Hypotension- acute on chronic with multiple issues playing role =pulmonary HTN, RV failure, and ESRD. On large dose of midodrine 20mg  tid  but still have ascites( sp IR tap3/28) and hypotension with UF on HD. Feels stable today  . With  His RV failure, very dependent upon preload so will have to be careful with UF. 2. ESRD- continue TTS schedule,today after IR declotts avf or places perm cath / will not pull much fluid. 3. Gi Bleed= noted bloody stools this am per RN notes- admit team RX 4. 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 Nutrition: protein and caloric malnutrition with bitemporal wasting. Encourage protein intake 7. Diarrhea- chronic issue 8. Vascular access- left AVF with proximal thrill and bruit but not along venous drainage,  likely with clot (has h/o stensosis requiring PTA). Will consult IR for declot/angioplasty. 9. Ascites- s/p paracentesis 3/28 and feels better. 10. Disposition- poor overall prognosis.palliative care seeing to help set goals/limits of care / NOW  Is DNR  Ernest Haber, PA-C [facility] [phone] 05/07/2015,10:56 AM  LOS: 5 days   Labs: Basic Metabolic Panel:  Recent Labs Lab 05/03/15 0404 05/05/15 0821 04/29/2015 0811  NA 141 139 141  K 4.3 4.7 4.6  CL 106 105 104  CO2 20* 16* 19*  GLUCOSE 70 66 83  BUN 61* 80* 94*  CREATININE 6.32* 7.86* 8.91*  CALCIUM 7.5* 7.9* 7.7*  PHOS  --  7.7* 7.9*   Liver Function Tests:  Recent Labs Lab 05/05/15 0821 05/05/15 1058 04/24/2015 0811  AST  --  18  --   ALT  --  14*  --   ALKPHOS  --  79  --   BILITOT  --  1.1  --   PROT  --  5.9*  --   ALBUMIN 2.9* 2.8* 2.6*   No results for input(s): LIPASE, AMYLASE in the last 168 hours. No results for input(s): AMMONIA in the last 168 hours. CBC:  Recent Labs Lab 04/25/2015 1315 05/03/15 0404 05/05/15 0821 04/18/2015 0811  WBC 12.1* 10.0 13.1* 10.7*  NEUTROABS 10.3*  --   --   --  HGB 10.3* 9.7* 9.9* 8.7*  HCT 34.3* 30.6* 32.4* 28.4*  MCV 100.0 100.0 99.4 99.6  PLT 230 198 230 254   Cardiac Enzymes: No results for input(s): CKTOTAL, CKMB, CKMBINDEX, TROPONINI in the last 168 hours. CBG:  Recent Labs Lab 05/04/15 1706  GLUCAP 100*    Medications: . sodium chloride 10 mL/hr at 05/04/15 1117   . aspirin EC  81 mg Oral Daily  . feeding supplement (NEPRO CARB STEADY)  237 mL Oral TID BM  . levothyroxine  50 mcg Oral QAC breakfast  . midodrine  20 mg Oral TID WC  . sildenafil  10 mg Oral 3 times per day  . sodium chloride flush  3 mL Intravenous Q12H      I have seen and examined this patient and agree with plan as outlined by Ernest Haber, PA-C.  Appreciate Palliative Care assistance.  Now with likely GI bleed and multiple BM's.  Prognosis is grim  and I did reiterate that we are doing everything possible, however his blood pressure remains low even without volume removal and we have nothing more to offer. [place] John A Shresta Risden,MD 05/07/2015 12:15 PM

## 2015-05-07 NOTE — Progress Notes (Signed)
[facility]  Patient Status: Active (receiving services up to time of hospitalization)  [facility] is providing the following services: RN and PT  If patient discharges after hours, please call [phone].   Erik Ochoa 05/07/2015, 12:38 PM

## 2015-05-07 NOTE — Progress Notes (Signed)
CRITICAL VALUE ALERT  Critical value received:  Hemoglobin 6.7   Date of notification:  05/07/2015  Time of notification:  11:02 AM  Critical value read back:Yes.    Nurse who received alert:  Maggie Font RN  MD notified (1st page):  A. Charlies Silvers MD  Time of first page:  11:04 AM  MD notified (2nd page):  Time of second page:  Responding MD:  A. Charlies Silvers, DM  Time MD responded:  11:23 AM  Comments: New orders written. Will continue to monitor and assess the patient.   [employer], RN-BC, [employer] [facility] [address] Phone [phone]

## 2015-05-07 NOTE — Progress Notes (Signed)
 Daily Progress Note   Patient Name: Lorie Kutzer       Date: 05/07/2015 DOB: [birth year]  Age: 75 y.o. MRN#: [id number] Attending Physician: Robbie Lis, MD Primary Care Physician: Raquel James Admit Date: 04/27/2015  Reason for Consultation/Follow-up: Establishing goals of care  Subjective:  Patient reports bowel movements every 20 minutes that are straight red blood.  He complains of LLQ pain/cramping.   He now is open to re-eval by GI.  Interval Events:  Hgb dropped overnight from 9.9 on 3/28 to 6.7 on 3/30.  No on anticoagulation.  Receiving a unit of blood.  He has had chronic diarrhea but is c-diff neg this admission.  BP is 62/40.  Patient's HD cath is clotted.  Doubt they will be able to declot cath as he is actively bleeding and has low BP.  He feels as if his ascites is re-accumulating, but will be unable to have paracentesis as his BP will not permit it.  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  He is still not accepting of hospice services.  Length of Stay: 6 days  Current Medications: Scheduled Meds:  . sodium chloride   Intravenous Once  . aspirin EC  81 mg Oral Daily  . calcium acetate  667 mg Oral TID WC  . calcium gluconate  1 g Intravenous Once  . darbepoetin (ARANESP) injection - DIALYSIS  150 mcg Intravenous Q Thu-HD  . dextrose      . feeding supplement (NEPRO CARB STEADY)  237 mL Oral TID BM  . insulin aspart  10 Units Intravenous Once  . levothyroxine  50 mcg Oral QAC breakfast  . midodrine  20 mg Oral TID WC  . pantoprazole (PROTONIX) IV  40 mg Intravenous Q12H  . sildenafil  10 mg Oral Daily  . sodium  chloride flush  3 mL Intravenous Q12H    Continuous Infusions: . sodium chloride 10 mL/hr at 05/04/15 1117  . dextrose 50 mL/hr at  0853    PRN Meds: acetaminophen **OR** acetaminophen, dextrose, ondansetron **OR** ondansetron (ZOFRAN) IV   Physical Exam         Cachectic,  lying in bed.  A&O cooperative. Lungs mildly increased work of breathing.  No w/c/r CV.  Irreg Irreg.  No m/r/g Abdomen:  Slight tenderness in LLQ, +BS, increasing abdominal girth. +Fluid Ext.  No Edema, able to move all 4      Vital Signs: BP 75/36 mmHg  Pulse 94  Temp(Src) 97.2 F (36.2 C) (Oral)  Resp 16  Ht 6\' 4"  (1.93 m)  Wt 70 kg (154 lb 5.2 oz)  BMI 18.79 kg/m2  SpO2 94% SpO2: SpO2: 94 % O2 Device: O2 Device: Nasal Cannula O2 Flow Rate:    Intake/output summary:   Intake/Output Summary (Last 24 hours) at  [id number] Last data filed at 05/07/15 1914  Gross per 24 hour  Intake    353 ml  Output      1 ml  Net    352 ml   LBM: Last BM Date: 05/07/15 Baseline Weight: Weight: 71.2 kg (156 lb 15.5 oz) Most recent weight: Weight: 70 kg (154 lb 5.2 oz)       Palliative Assessment/Data: Flowsheet Rows        Most Recent Value   Intake Tab    Referral Department  Hospitalist   Unit at Time of Referral  Med/Surg Unit   Palliative Care Primary Diagnosis  Cardiac   Date Notified  05/04/15   Palliative Care Type  New Palliative care   Reason for referral  Clarify Goals of Care, Non-pain Symptom   Date of Admission  04/14/2015   Date first seen by Palliative Care  05/05/15   # of days Palliative referral response time  1 Day(s)   # of days IP prior to Palliative referral  2   Clinical Assessment    Palliative Performance Scale Score  30%   Pain Max last 24 hours  4   Pain Min Last 24 hours  1   Psychosocial & Spiritual Assessment    Palliative Care Outcomes    Patient/Family meeting held?  Yes   Who was at the meeting?  son and patient   Palliative Care Outcomes  Provided  psychosocial or spiritual support      Additional Data Reviewed: CBC    Component Value Date/Time   WBC 24.4*  0600   WBC 13.3* 05/18/2011 1404   RBC 2.36*  0600   RBC 5.22 05/18/2011 1404   HGB 7.4*  0600   HGB 14.1 05/18/2011 1404   HCT 23.7*  0600   HCT 44.4 05/18/2011 1404   PLT 464*  0600   PLT 361 05/18/2011 1404   MCV 100.4*  0600   MCV 85.1 05/18/2011 1404   MCH 31.4  0600   MCH 27.0* 05/18/2011 1404   MCHC 31.2  0600   MCHC 31.8* 05/18/2011 1404   RDW 20.7*  0600   RDW 16.0* 05/18/2011 1404   LYMPHSABS 1.6 04/21/2015 1315   LYMPHSABS 1.2 05/18/2011 1404   MONOABS 0.1 04/16/2015 1315   MONOABS 0.4 05/18/2011 1404   EOSABS 0.1 04/21/2015 1315   EOSABS 0.2 05/18/2011 1404   BASOSABS 0.0 04/21/2015 1315   BASOSABS 0.1 05/18/2011 1404    CMP     Component Value Date/Time   NA 144  0600   K 6.5*  0600   CL 105  0600   CO2 7*  0600   GLUCOSE <20*  0600   BUN 129*  0600   CREATININE 10.68*  0600   CALCIUM 8.1*  0600   PROT 5.9* 05/05/2015  1058   ALBUMIN 2.3*  0600   AST 18 05/05/2015 1058   ALT 14* 05/05/2015 1058   ALKPHOS 79 05/05/2015 1058   BILITOT 1.1 05/05/2015 1058   GFRNONAA 4*  0600   GFRAA 5*  0600       Problem List:  Patient Active Problem List   Diagnosis Date Noted  . DNR (do not resuscitate)   . Palliative care encounter   . Goals of care, counseling/discussion   . Encounter for hospice care discussion   . Arterial hypotension 05/03/2015  . Diarrhea 05/03/2015  . 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 Hypotension 04/28/2015  . Acute respiratory failure ([place])   . Ascites   . Adrenal insufficiency ([place]) 04/12/2015  . Liver cirrhosis ([place]) 04/12/2015  . Protein-calorie malnutrition, severe 04/11/2015    . Encounter for central line placement   . Pleural effusion   . Acute pulmonary edema (HCC)   . Left upper extremity swelling   . Pressure ulcer 04/04/2015  . Sepsis ([place]) 04/03/2015  . ESRD (end stage renal disease) ([place])   . Shock ([place])   . Gout flare 05/20/2012  . Chronic kidney disease (CKD), stage IV (severe) ([place]) 05/19/2012  . Polyarthralgia 05/18/2012  . Volume overload 05/18/2012  . Hyponatremia 05/17/2012  . Hypokalemia 05/17/2012  . Leukocytosis 05/17/2012  . Pulmonary hypertension ([place])   . Hyperlipidemia   . CHF (congestive heart failure) ([place])   . Hypothyroid   . ET (essential thrombocythemia) ([place])   . Hypertension      Palliative Care Assessment & Plan    1.Code Status:  DNR    Code Status Orders        Start     Ordered   04/30/2015 1336  Do not attempt resuscitation (DNR)   Continuous    Question Answer Comment  In the event of cardiac or respiratory ARREST Do not call a "code blue"   In the event of cardiac or respiratory ARREST Do not perform Intubation, CPR, defibrillation or ACLS   In the event of cardiac or respiratory ARREST Use medication by any route, position, wound care, and other measures to relive pain and suffering. May use oxygen, suction and manual treatment of airway obstruction as needed for comfort.       1335    Code Status History    Date Active Date Inactive Code Status Order ID Comments User Context   04/19/2015  8:01 PM 04/20/2015  1:35 PM Full Code [id number]  Lily Kocher, MD Inpatient   04/03/2015  2:23 PM 04/16/2015  7:43 PM Full Code [id number]  Erick Colace, NP ED   05/17/2012 10:46 PM 05/20/2012  6:01 PM Full Code [id number]  Theodis Blaze, MD ED       2. Goals of Care/Additional Recommendations:  DNR / DNI.  Limitations on Scope of Treatment: Full Scope Treatment  Desire for further Chaplaincy support: No  Psycho-social Needs: Caregiving  Support/Resources and Grief/Bereavement Support   Patient is open  to GI re-evaluation given his current bleeding.  3. Symptom Management:      Per primary team.  Patient not complaining of pain, dyspnea, anxiety.  4. Palliative Prophylaxis:   Delirium Protocol, Oral Care and Turn Reposition  5. Prognosis: Hours - Days  6. Discharge Planning:  Hospital death anticipated.  Possible transfer to Hospice facility.   Care plan was discussed with Nephrology MD, Son, Patient  Thank you for allowing the Palliative Medicine Team  to assist in the care of this patient.   Time In: 3:00 Time Out: 3:35 Total Time 35 Prolonged Time Billed no        Melton Alar, PA-C  , 9:06 AM  Please contact Palliative Medicine Team phone at [phone] for questions and concerns.

## 2015-05-07 NOTE — Progress Notes (Signed)
Chaplain presented to the patient to provide spiritual care support and to assist in the completion of an Advance Directive.  The Directive was completed and notarized with a copy given to the patient and one placed in his medical record. The patient was very appreciative of having it completed.  Chaplain offered words of encouragement and prayer of comfort. Chaplain will continue to follow for support as needed. Chaplain Yaakov Guthrie [phone]

## 2015-05-07 NOTE — Progress Notes (Signed)
Pt was to possibly have IR procedure for clotted left arm fistula today and waited until 7 pm before finding out for sure from [place] in radiology that it would not be tonight. Pt and son updated. Pt's BP is still ranging betw [id number] systolic. No ivfs ordered.  Pt appears to not be symptomatic. Sats on RA are 96%. Pt was ordered 2 units of prbcs today-1 before IR and 1 to be in HD. Second unit therefore was not given while waiting for IR and HD-which cant be done.  Pt continues to have frequent bloody loose stools. No labs were oredered for after prbcs or for am. Dr. Joelyn Oms notified of the above info and orders received for am cbc, renal panel, t&s(already done), give 2nd unit of prbcs tonight and npo after midnight. No ivfs at this point. Will cont to monitor pt closely.

## 2015-05-08 DIAGNOSIS — R1084 Generalized abdominal pain: Secondary | ICD-10-CM

## 2015-05-08 DIAGNOSIS — K920 Hematemesis: Secondary | ICD-10-CM | POA: Insufficient documentation

## 2015-05-08 DIAGNOSIS — D62 Acute posthemorrhagic anemia: Secondary | ICD-10-CM

## 2015-05-08 DIAGNOSIS — R110 Nausea: Secondary | ICD-10-CM

## 2015-05-08 DIAGNOSIS — Z515 Encounter for palliative care: Secondary | ICD-10-CM | POA: Insufficient documentation

## 2015-05-08 LAB — CBC
HCT: 22.2 % — ABNORMAL LOW (ref 39.0–52.0)
HEMATOCRIT: 23.7 % — AB (ref 39.0–52.0)
HEMOGLOBIN: 6.9 g/dL — AB (ref 13.0–17.0)
Hemoglobin: 7.4 g/dL — ABNORMAL LOW (ref 13.0–17.0)
MCH: 31.4 pg (ref 26.0–34.0)
MCH: 31.2 pg (ref 26.0–34.0)
MCHC: 31.2 g/dL (ref 30.0–36.0)
MCHC: 31.1 g/dL (ref 30.0–36.0)
MCV: 100.4 fL — AB (ref 78.0–100.0)
MCV: 100.5 fL — ABNORMAL HIGH (ref 78.0–100.0)
PLATELETS: 464 10*3/uL — AB (ref 150–400)
PLATELETS: 466 10*3/uL — AB (ref 150–400)
RBC: 2.36 MIL/uL — AB (ref 4.22–5.81)
RBC: 2.21 MIL/uL — AB (ref 4.22–5.81)
RDW: 20.7 % — AB (ref 11.5–15.5)
RDW: 21.1 % — ABNORMAL HIGH (ref 11.5–15.5)
WBC: 24.4 10*3/uL — ABNORMAL HIGH (ref 4.0–10.5)
WBC: 28.5 10*3/uL — AB (ref 4.0–10.5)

## 2015-05-08 LAB — GLUCOSE, CAPILLARY
Glucose-Capillary: 70 mg/dL (ref 65–99)
Glucose-Capillary: <10 mg/dL — CL (ref 65–99)

## 2015-05-08 LAB — TYPE AND SCREEN
ABO/RH(D): O POS
Antibody Screen: NEGATIVE
Unit division: 0
Unit division: 0

## 2015-05-08 LAB — RENAL FUNCTION PANEL
Albumin: 2.3 g/dL — ABNORMAL LOW (ref 3.5–5.0)
Anion gap: 32 — ABNORMAL HIGH (ref 5–15)
BUN: 129 mg/dL — ABNORMAL HIGH (ref 6–20)
CHLORIDE: 105 mmol/L (ref 101–111)
CO2: 7 mmol/L — AB (ref 22–32)
CREATININE: 10.68 mg/dL — AB (ref 0.61–1.24)
Calcium: 8.1 mg/dL — ABNORMAL LOW (ref 8.9–10.3)
GFR calc non Af Amer: 4 mL/min — ABNORMAL LOW (ref >60)
GFR, EST AFRICAN AMERICAN: 5 mL/min — AB (ref >60)
Glucose, Bld: <20 mg/dL — CL (ref 65–99)
POTASSIUM: 6.5 mmol/L — AB (ref 3.5–5.1)
Phosphorus: 11.3 mg/dL — ABNORMAL HIGH (ref 2.5–4.6)
Sodium: 144 mmol/L (ref 135–145)

## 2015-05-08 LAB — APTT: APTT: 47 s — AB (ref 24–37)

## 2015-05-08 LAB — PROTIME-INR
INR: 2.23 — AB (ref 0.00–1.49)
PROTHROMBIN TIME: 24.5 s — AB (ref 11.6–15.2)

## 2015-05-08 MED ORDER — HALOPERIDOL LACTATE 5 MG/ML IJ SOLN
1.0000 mg | Freq: Four times a day (QID) | INTRAMUSCULAR | Status: DC | PRN
  Administered 2015-05-08: 1 mg via INTRAVENOUS

## 2015-05-08 MED ORDER — SODIUM CHLORIDE 0.9 % IV SOLN
1.0000 g | Freq: Once | INTRAVENOUS | Status: DC
  Administered 2015-05-08: 1 g via INTRAVENOUS
  Filled 2015-05-08: qty 10

## 2015-05-08 MED ORDER — BIOTENE DRY MOUTH MT LIQD: 15.0000 mL | OROMUCOSAL | Status: DC | PRN

## 2015-05-08 MED ORDER — HALOPERIDOL LACTATE 5 MG/ML IJ SOLN: 0.5000 mg | INTRAMUSCULAR | Status: DC | PRN

## 2015-05-08 MED ORDER — GLYCOPYRROLATE 0.2 MG/ML IJ SOLN
0.4000 mg | Freq: Four times a day (QID) | INTRAMUSCULAR | Status: DC
  Administered 2015-05-08: 0.4 mg via INTRAVENOUS
  Filled 2015-05-08 (×3): qty 2

## 2015-05-08 MED ORDER — MORPHINE SULFATE (PF) 2 MG/ML IV SOLN
INTRAVENOUS | Status: AC
  Administered 2015-05-08: 2 mg via INTRAVENOUS
  Filled 2015-05-08: qty 1

## 2015-05-08 MED ORDER — LORAZEPAM 2 MG/ML IJ SOLN
INTRAMUSCULAR | Status: AC
  Filled 2015-05-08: qty 1

## 2015-05-08 MED ORDER — MORPHINE SULFATE (PF) 2 MG/ML IV SOLN
2.0000 mg | INTRAVENOUS | Status: DC | PRN
  Administered 2015-05-08 (×3): 2 mg via INTRAVENOUS
  Filled 2015-05-08 (×3): qty 1

## 2015-05-08 MED ORDER — GLYCOPYRROLATE 1 MG PO TABS
1.0000 mg | ORAL_TABLET | ORAL | Status: DC | PRN
  Filled 2015-05-08: qty 1

## 2015-05-08 MED ORDER — DEXTROSE 50 % IV SOLN
INTRAVENOUS | Status: AC
  Administered 2015-05-08: 25 mL via INTRAVENOUS
  Filled 2015-05-08: qty 50

## 2015-05-08 MED ORDER — DEXTROSE 50 % IV SOLN
1.0000 | Freq: Once | INTRAVENOUS | Status: AC
  Administered 2015-05-08: 50 mL via INTRAVENOUS

## 2015-05-08 MED ORDER — GLYCOPYRROLATE 0.2 MG/ML IJ SOLN
0.4000 mg | Freq: Once | INTRAMUSCULAR | Status: DC
  Filled 2015-05-08: qty 2

## 2015-05-08 MED ORDER — PANTOPRAZOLE SODIUM 40 MG IV SOLR
80.0000 mg | Freq: Once | INTRAVENOUS | Status: AC
  Administered 2015-05-08: 80 mg via INTRAVENOUS
  Filled 2015-05-08: qty 80

## 2015-05-08 MED ORDER — HALOPERIDOL LACTATE 5 MG/ML IJ SOLN: 1.0000 mg | Freq: Four times a day (QID) | INTRAMUSCULAR | Status: DC | PRN

## 2015-05-08 MED ORDER — SODIUM CHLORIDE 0.9 % IV BOLUS (SEPSIS)
500.0000 mL | Freq: Once | INTRAVENOUS | Status: AC
  Administered 2015-05-08: 500 mL via INTRAVENOUS

## 2015-05-08 MED ORDER — INSULIN ASPART 100 UNIT/ML ~~LOC~~ SOLN
10.0000 [IU] | Freq: Once | SUBCUTANEOUS | Status: AC
  Administered 2015-05-08: 10 [IU] via INTRAVENOUS

## 2015-05-08 MED ORDER — DEXTROSE 50 % IV SOLN
INTRAVENOUS | Status: AC
  Filled 2015-05-08: qty 50

## 2015-05-08 MED ORDER — ONDANSETRON HCL 4 MG/2ML IJ SOLN
4.0000 mg | INTRAMUSCULAR | Status: DC | PRN
  Administered 2015-05-08 (×3): 4 mg via INTRAVENOUS
  Filled 2015-05-08 (×3): qty 2

## 2015-05-08 MED ORDER — LORAZEPAM 2 MG/ML IJ SOLN: 1.0000 mg | INTRAMUSCULAR | Status: DC | PRN

## 2015-05-08 MED ORDER — ACETAMINOPHEN 650 MG RE SUPP: 650.0000 mg | Freq: Four times a day (QID) | RECTAL | Status: DC | PRN

## 2015-05-08 MED ORDER — GLYCOPYRROLATE 0.2 MG/ML IJ SOLN
0.2000 mg | INTRAMUSCULAR | Status: DC | PRN
  Administered 2015-05-08: 0.2 mg via INTRAVENOUS
  Filled 2015-05-08 (×2): qty 1

## 2015-05-08 MED ORDER — HALOPERIDOL LACTATE 5 MG/ML IJ SOLN
INTRAMUSCULAR | Status: AC
  Filled 2015-05-08: qty 1

## 2015-05-08 MED ORDER — SODIUM CHLORIDE 0.9 % IV SOLN
8.0000 mg/h | INTRAVENOUS | Status: DC
  Administered 2015-05-08: 8 mg/h via INTRAVENOUS
  Filled 2015-05-08 (×3): qty 80

## 2015-05-08 MED ORDER — MORPHINE SULFATE (PF) 2 MG/ML IV SOLN: 2.0000 mg | INTRAVENOUS | Status: DC | PRN

## 2015-05-08 MED ORDER — HALOPERIDOL LACTATE 5 MG/ML IJ SOLN: 1.0000 mg | Freq: Once | INTRAMUSCULAR | Status: DC

## 2015-05-08 MED ORDER — ONDANSETRON HCL 4 MG PO TABS: 4.0000 mg | ORAL_TABLET | ORAL | Status: DC | PRN

## 2015-05-08 MED ORDER — HALOPERIDOL 0.5 MG PO TABS: 0.5000 mg | ORAL_TABLET | ORAL | Status: DC | PRN

## 2015-05-08 MED ORDER — DEXTROSE 10 % IV SOLN
INTRAVENOUS | Status: DC
  Administered 2015-05-08: 09:00:00 via INTRAVENOUS

## 2015-05-08 MED ORDER — HALOPERIDOL LACTATE 2 MG/ML PO CONC: 0.5000 mg | ORAL | Status: DC | PRN

## 2015-05-08 MED ORDER — LORAZEPAM 2 MG/ML IJ SOLN
1.0000 mg | Freq: Once | INTRAMUSCULAR | Status: AC
  Administered 2015-05-08: 1 mg via INTRAVENOUS

## 2015-05-08 MED ORDER — HALOPERIDOL LACTATE 5 MG/ML IJ SOLN
1.0000 mg | Freq: Three times a day (TID) | INTRAMUSCULAR | Status: DC
  Administered 2015-05-08: 1 mg via INTRAVENOUS
  Filled 2015-05-08: qty 1

## 2015-05-08 MED ORDER — SODIUM CHLORIDE 0.9 % IV SOLN
5.0000 mg/h | INTRAVENOUS | Status: DC
  Administered 2015-05-08: 2 mg/h via INTRAVENOUS
  Filled 2015-05-08: qty 10

## 2015-05-08 MED ORDER — GLYCOPYRROLATE 0.2 MG/ML IJ SOLN
0.2000 mg | INTRAMUSCULAR | Status: DC | PRN
  Filled 2015-05-08: qty 1

## 2015-05-08 MED ORDER — ACETAMINOPHEN 325 MG PO TABS: 650.0000 mg | ORAL_TABLET | Freq: Four times a day (QID) | ORAL | Status: DC | PRN

## 2015-05-08 MED ORDER — POLYVINYL ALCOHOL 1.4 % OP SOLN
1.0000 [drp] | Freq: Four times a day (QID) | OPHTHALMIC | Status: DC | PRN
  Filled 2015-05-08: qty 15

## 2015-05-08 MED ORDER — DEXTROSE 50 % IV SOLN
25.0000 mL | INTRAVENOUS | Status: DC | PRN
  Administered 2015-05-08 (×2): 25 mL via INTRAVENOUS

## 2015-05-08 NOTE — Clinical Social Work Note (Signed)
CSW consulted and monitoring patient progress. CSW will assist as needed - per [name] progress note, patient not expected to survive 24 hours.  Erik Ochoa, MSW, LCSW Licensed Clinical Social Worker [place] [phone]

## 2015-05-08 NOTE — Progress Notes (Signed)
 Daily Progress Note   Patient Name: Erik Ochoa       Date:  DOB: [birth year]  Age: 75 y.o. MRN#: [id number] Attending Physician: Robbie Lis, MD Primary Care Physician: Raquel James Admit Date: 04/24/2015  Reason for Consultation/Follow-up: Establishing goals of care  Subjective:  Complaining of abdominal pain and nausea   Interval Events:  Actively vomiting blood.  Approximate 1.5 cups of red blood is currently in his emesis bag.  BP still 60s/40s Patient is in pain.   I spoke with RNs, then the patient and then his son, Erik Ochoa.  The patient appears to be actively dying.  After speaking with Erik Ochoa. The patient said ok to comfort measures.   I d/c'd all treatments that are not directly related to his comfort.  Gave morphine and ativan.  Consulted with my attending regarding hematemesis and abdominal pain.  Haldol was recommended.  PMT will continue to check on [name], but I doubt he will survive 24 hours.  His son is aware and should arrive at the hospital around 11:30 am this morning.   Length of Stay: 6 days  Current Medications: Scheduled Meds:  . dextrose      . haloperidol lactate      . LORazepam      . pantoprazole (PROTONIX) IV  80 mg Intravenous Once  . sodium chloride flush  3 mL Intravenous Q12H    Continuous Infusions: . pantoprozole (PROTONIX) infusion      PRN Meds: acetaminophen **OR** acetaminophen, acetaminophen **OR** acetaminophen, antiseptic oral rinse, glycopyrrolate **OR** glycopyrrolate **OR** glycopyrrolate, haloperidol lactate, [DISCONTINUED] haloperidol **OR** [DISCONTINUED] haloperidol **OR** haloperidol lactate, LORazepam, morphine injection, ondansetron **OR** ondansetron (ZOFRAN) IV, polyvinyl alcohol   Physical  Exam             Frail, cachectic, actively vomiting blood. Bent over with abdominal pain. Moderately increased work of bleeding.  Vital Signs: BP 75/36 mmHg  Pulse 94  Temp(Src) 97.2 F (36.2 C) (Oral)  Resp 16  Ht 6\' 4"  (1.93 m)  Wt 70 kg (154 lb 5.2 oz)  BMI 18.79 kg/m2  SpO2 94% SpO2: SpO2: 94 % O2 Device: O2 Device: Nasal Cannula O2 Flow Rate:    Intake/output summary:   Intake/Output Summary (Last 24 hours) at  1003 Last data filed at 05/07/15 1914  Gross per 24 hour  Intake    353 ml  Output      1 ml  Net    352 ml   LBM: Last BM Date: 05/07/15 Baseline Weight: Weight: 71.2 kg (156 lb 15.5 oz) Most recent weight: Weight: 70 kg (154 lb 5.2 oz)       Palliative Assessment/Data: Flowsheet Rows        Most Recent Value   Intake Tab    Referral Department  Hospitalist   Unit at Time of Referral  Med/Surg Unit   Palliative Care Primary Diagnosis  Cardiac   Date Notified  05/04/15   Palliative Care Type  New Palliative care   Reason for referral  Clarify Goals of Care, Non-pain Symptom   Date of Admission  04/21/2015   Date first seen by Palliative Care  05/05/15   # of days Palliative referral response time  1 Day(s)   # of days IP prior to Palliative referral  2   Clinical Assessment    Palliative Performance Scale Score  20%   Pain Max last 24 hours  4   Pain Min Last 24 hours  1   Psychosocial & Spiritual Assessment    Palliative Care Outcomes    Patient/Family meeting held?  Yes   Who was at the meeting?  son and patient   Palliative Care Outcomes  Provided psychosocial or spiritual support      Additional Data Reviewed: CBC    Component Value Date/Time   WBC PENDING  0907   WBC 13.3* 05/18/2011 1404   RBC 2.21*  0907   RBC 5.22 05/18/2011 1404   HGB 6.9*  0907   HGB 14.1 05/18/2011 1404   HCT 22.2*  0907   HCT 44.4 05/18/2011 1404   PLT PENDING  0907   PLT 361 05/18/2011 1404   MCV  100.5*  0907   MCV 85.1 05/18/2011 1404   MCH 31.2  0907   MCH 27.0* 05/18/2011 1404   MCHC 31.1  0907   MCHC 31.8* 05/18/2011 1404   RDW 21.1*  0907   RDW 16.0* 05/18/2011 1404   LYMPHSABS 1.6 05/05/2015 1315   LYMPHSABS 1.2 05/18/2011 1404   MONOABS 0.1 05/05/2015 1315   MONOABS 0.4 05/18/2011 1404   EOSABS 0.1 04/24/2015 1315   EOSABS 0.2 05/18/2011 1404   BASOSABS 0.0 05/04/2015 1315   BASOSABS 0.1 05/18/2011 1404    CMP     Component Value Date/Time   NA 144  0600   K 6.5*  0600   CL 105  0600   CO2 7*  0600   GLUCOSE <20*  0600   BUN 129*  0600   CREATININE 10.68*  0600   CALCIUM 8.1*  0600   PROT 5.9* 05/05/2015 1058   ALBUMIN 2.3*  0600   AST 18 05/05/2015 1058   ALT 14* 05/05/2015 1058   ALKPHOS 79 05/05/2015 1058   BILITOT 1.1 05/05/2015 1058   GFRNONAA 4*  0600   GFRAA 5*  0600       Problem List:  Patient Active Problem List   Diagnosis Date Noted  . Abdominal pain, generalized   . DNR (do not resuscitate)   . Palliative care encounter   . Goals of care, counseling/discussion   . Encounter for hospice care discussion   . Arterial hypotension 05/03/2015  . Diarrhea 05/03/2015  . 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 Hypotension 04/26/2015  .  Acute respiratory failure ([place])   . Ascites   . Adrenal insufficiency ([place]) 04/12/2015  . Liver cirrhosis ([place]) 04/12/2015  . Protein-calorie malnutrition, severe 04/11/2015  . Encounter for central line placement   . Pleural effusion   . Acute pulmonary edema (HCC)   . Left upper extremity swelling   . Pressure ulcer 04/04/2015  . Sepsis ([place]) 04/03/2015  . ESRD (end stage renal disease) ([place])   . Shock ([place])   . Gout flare 05/20/2012  . Chronic kidney disease (CKD), stage IV (severe) ([place]) 05/19/2012  . Polyarthralgia  05/18/2012  . Volume overload 05/18/2012  . Hyponatremia 05/17/2012  . Hypokalemia 05/17/2012  . Leukocytosis 05/17/2012  . Pulmonary hypertension ([place])   . Hyperlipidemia   . CHF (congestive heart failure) ([place])   . Hypothyroid   . ET (essential thrombocythemia) ([place])   . Hypertension      Palliative Care Assessment & Plan    Code Status: DNR   Goals of Care/Additional Recommendations: Full Comfort Care  Symptom Management:  Morphine for pain.   Ativan for agitation / anxiety Haldol for vomiting / nausea Will leave protonix on board given GI bleeding - hopeful for comfort.  Prognosis: Hours - Days  Discharge Planning:  Anticipated Hospital Death.  I do not expect him to survive 24 hours.   Care plan was discussed with son Rush Landmark.  RN, My attending physician.  Thank you for allowing the Palliative Medicine Team to assist in the care of this patient.   Time In: 9:00 Time Out: 9:35 Total Time 35 Prolonged Time Billed no       Imogene Burn, [place] [facility] Pager: [phone]   , 10:03 AM  Please contact Palliative Medicine Team phone at [phone] for questions and concerns.

## 2015-05-08 NOTE — Consult Note (Signed)
 Chief Complaint: Patient was seen in consultation today for tunneled hemodialysis catheter placement Chief Complaint  Patient presents with  . Hypotension   at the request of Dr Katrine Coho  Referring Physician(s): Dr Katrine Coho  Supervising Physician: Daryll Brod  History of Present Illness: Erik Ochoa is a 75 y.o. male   ESRD Hypotension Ongoing GI bleed Clotted Left arm fistula; eval with Renal MD does feel proximal thrill and bruit Evaluated by Dr Anselm Pancoast 3/30--- Appears clotted Not good candidate for thrombolysis secondary GI bleed; hypotension Now scheduled for tunneled HD catheter placement in IR    Past Medical History  Diagnosis Date  . Hypertension   . Renal disorder   . ET (essential thrombocythemia) ([place])   . Hypothyroid   . CHF (congestive heart failure) ([place])   . A-fib ([place])   . Hyperlipidemia   . Pulmonary hypertension ([place])   . Nephrolithiasis   . History of leukocytosis 2014    Chronic leukocytosis, baseline 25K approx dating back to 2014    Past Surgical History  Procedure Laterality Date  . Amputation  05/14/2011    Procedure: AMPUTATION DIGIT;  Surgeon: Dennie Bible, MD;  Location: [facility];  Service: Plastics;  Laterality: Right;  right index finger attempted revascularization  . Cardiac catheterization N/A 04/08/2015    Procedure: Right Heart Cath;  Surgeon: Larey Dresser, MD;  Location: [facility];  Service: Cardiovascular;  Laterality: N/A;    Allergies: Review of patient's allergies indicates no known allergies.  Medications: Prior to Admission medications   Medication Sig Start Date End Date Taking? Authorizing Provider  levothyroxine (SYNTHROID) 50 MCG tablet Take 50 mcg by mouth daily before breakfast.   Yes Historical Provider, MD  midodrine (PROAMATINE) 5 MG tablet Take 20 mg by mouth 3 (three) times daily with meals.   Yes Historical Provider, MD  Multiple Vitamin (MULTIVITAMIN WITH MINERALS)  TABS tablet Take 1 tablet by mouth daily.   Yes Historical Provider, MD  Omega 3 1000 MG CAPS Take 1,000 mg by mouth every evening.   Yes Historical Provider, MD  sildenafil (REVATIO) 20 MG tablet Take 1 tablet (20 mg total) by mouth 3 (three) times daily. Patient taking differently: Take 10 mg by mouth 3 (three) times daily.  04/16/15  Yes Shanker Kristeen Mans, MD  aspirin EC 81 MG tablet Take 1 tablet (81 mg total) by mouth daily. Patient not taking: Reported on 04/08/2015 04/16/15   Jonetta Osgood, MD     Family History  Problem Relation Age of Onset  . Cancer Mother   . Cancer Brother     Social History   Social History  . Marital Status: Unknown    Spouse Name: N/A  . Number of Children: N/A  . Years of Education: N/A   Social History Main Topics  . Smoking status: Never Smoker   . Smokeless tobacco: Former Systems developer  . Alcohol Use: No  . Drug Use: No  . Sexual Activity: No   Other Topics Concern  . None   Social History Narrative    Review of Systems: A 12 point ROS discussed and pertinent positives are indicated in the HPI above.  All other systems are negative.  Review of Systems  Constitutional: Positive for activity change, appetite change and fatigue. Negative for fever.  HENT: Positive for hearing loss.   Respiratory: Positive for shortness of breath.   Cardiovascular: Negative for chest pain.  Neurological: Positive for weakness.  Psychiatric/Behavioral: Negative for behavioral problems and confusion.    Vital Signs: BP 66/30 mmHg  Pulse 69  Temp(Src) 97.2 F (36.2 C) (Oral)  Resp 16  Ht 6\' 4"  (1.93 m)  Wt 154 lb 5.2 oz (70 kg)  BMI 18.79 kg/m2  SpO2 95%  Physical Exam  Constitutional: He is oriented to person, place, and time.  Cardiovascular: Normal rate and regular rhythm.   Pulmonary/Chest: Effort normal. He has wheezes.  Abdominal: Soft. Bowel sounds are normal.  Musculoskeletal: Normal range of motion.  Follows all commands Very weak    Neurological: He is alert and oriented to person, place, and time.  Skin: Skin is warm and dry.  Psychiatric: He has a normal mood and affect. His behavior is normal. Judgment and thought content normal.  Nursing note and vitals reviewed.   Mallampati Score:  MD Evaluation Airway: WNL Heart: WNL Abdomen: WNL Chest/ Lungs: WNL ASA  Classification: 3 Mallampati/Airway Score: One  Imaging: Nm Pulmonary Perf And Vent  04/12/2015  CLINICAL DATA:  Dyspnea, chronic atrial fibrillation, end-stage renal disease on dialysis, diastolic CHF heart failure, hypotension, volume overload, pulmonary hypertension, question chronic pulmonary embolism EXAM: NUCLEAR MEDICINE VENTILATION - PERFUSION LUNG SCAN TECHNIQUE: Ventilation images were obtained in multiple projections using inhaled aerosol Tc-57m DTPA. Perfusion images were obtained in multiple projections after intravenous injection of Tc-57m MAA. RADIOPHARMACEUTICALS:  [id number] millicuries [id number] DTPA aerosol inhalation and 4.0 millicuries [id number] MAA IV COMPARISON:  None; correlation chest radiograph 04/07/2015, none more recent FINDINGS: Ventilation: Diffusely diminished ventilation in the upper lobes. Ventilation defects that LEFT lower lobe base and the posterior RIGHT lower lobe. Central airway deposition of tracer. Perfusion: Small subsegmental perfusion defect LEFT upper lobe. Diminished perfusion at the posterior aspect of the RIGHT lower lobe and at the posterior LEFT lower lobe base, sites corresponding to pleural effusions and atelectasis on chest radiograph and matching the ventilatory abnormalities. No additional segmental or subsegmental perfusion defects identified. IMPRESSION: Matching abnormal ventilation and perfusion at the lower lobes corresponding to areas of atelectasis and effusion seen on chest radiography, though the available chest radiograph is 74 days old. Observed perfusion abnormalities are less than expected based on a  degree of atelectasis and volumes of pleural effusions identified by radiography. Findings represent a low probability for pulmonary embolism. Electronically Signed   By: Lavonia Dana M.D.   On: 04/12/2015 10:19   US Abdomen Limited  04/14/2015  CLINICAL DATA:  Assess for ascites EXAM: LIMITED ABDOMEN ULTRASOUND FOR ASCITES TECHNIQUE: Limited ultrasound survey for ascites was performed in all four abdominal quadrants. COMPARISON:  None. FINDINGS: There is moderate ascites throughout abdomen. IMPRESSION: Moderate ascites throughout abdomen. Electronically Signed   By: Abelardo Diesel M.D.   On: 04/14/2015 11:15   US Paracentesis  05/05/2015  INDICATION: End-stage renal disease. Right-sided heart failure. Recurrent ascites. Request diagnostic and therapeutic paracentesis of up to 3 L max. EXAM: ULTRASOUND GUIDED LEFT LOWER QUADRANT PARACENTESIS MEDICATIONS: None. COMPLICATIONS: None immediate. PROCEDURE: Informed written consent was obtained from the patient after a discussion of the risks, benefits and alternatives to treatment. A timeout was performed prior to the initiation of the procedure. Initial ultrasound scanning demonstrates a large amount of ascites within the right lower abdominal quadrant. The right lower abdomen was prepped and draped in the usual sterile fashion. 1% lidocaine with epinephrine was used for local anesthesia. Following this, a 7 cm, 19 gauge, Yueh catheter was introduced. An ultrasound image was saved for documentation purposes. The paracentesis was performed.  The catheter was removed and a dressing was applied. The patient tolerated the procedure well without immediate post procedural complication. FINDINGS: A total of approximately 3 L of clear yellow fluid was removed. Samples were sent to the laboratory as requested by the clinical team. IMPRESSION: Successful ultrasound-guided paracentesis yielding 3 liters of peritoneal fluid. Read by: Ascencion Dike PA-C Electronically Signed   By:  Jacqulynn Cadet M.D.   On: 05/05/2015 14:48   Dg Chest Port 1 View  04/15/2015  CLINICAL DATA:  Per EMS, PT is coming from Dialysis. Facility reports hypotension before dialysis with systolic of 70. Pt received a full treatment and then was given 800 cc of fluid to increase BP. Pt continued to have systolic BP of [id number]. Facility called EMS. EMS gave pt 500 cc of NS. Pt was in the hospital two weeks ago for hypotension. Pt reports having four or more episodes of diarrhea per day since leaving the hospital as well as weakness. Hx HTN, CHF, afib EXAM: PORTABLE CHEST 1 VIEW COMPARISON:  04/14/2015 FINDINGS: Bilateral pleural effusions obscuring hemidiaphragm similar to the prior study. There is lung base opacity that is likely due to atelectasis, also similar. There is prominence of the bronchovascular markings, increased from the prior study, although this difference may be due to lower lung volumes on current exam. There is no other evidence of a change. No pneumothorax. Cardiac silhouette is normal in size.  The aorta is mildly uncoiled. IMPRESSION: 1. Similar appearance to the previous chest radiograph when allowing for lower lung volumes on the current exam. 2. There are moderate bilateral pleural effusions with associated atelectasis. There is some prominence of the bronchovascular markings, but no overt pulmonary edema. Electronically Signed   By: Lajean Manes M.D.   On: 04/30/2015 14:53   Dg Chest Port 1 View  04/14/2015  CLINICAL DATA:  Acute respiratory failure, shortness of breath, sepsis, end-stage renal disease, acute pulmonary edema, cirrhosis. EXAM: PORTABLE CHEST 1 VIEW COMPARISON:  Portable chest x-ray of April 07, 2015 FINDINGS: The lungs remain mildly hypoinflated. Small to moderate-sized bilateral pleural effusions persist. The cardiac silhouette remains enlarged. The central pulmonary vascularity is prominent. There is no significant cephalization of the vascular pattern. The right internal  jugular catheter tip projects over the midportion of the SVC. IMPRESSION: Stable hypoinflation with smaller moderate-sized bilateral pleural effusions. Cardiomegaly without frank pulmonary edema. Electronically Signed   By: David  Martinique M.D.   On: 04/14/2015 07:10    Labs:  CBC:  Recent Labs  05/05/15 0821 05/04/2015 0811 05/07/15 1030  0600  WBC 13.1* 10.7* 14.5* 24.4*  HGB 9.9* 8.7* 6.7* 7.4*  HCT 32.4* 28.4* 21.6* 23.7*  PLT 230 254 383 464*    COAGS:  Recent Labs  04/03/15 1615  04/07/15 0422 04/08/15 0425 04/09/15 0350 04/13/15 0450 04/14/15 0505  INR 1.36  --   --   --   --  1.29 1.25  APTT 32  < > 33 37 33  --  34  < > = values in this interval not displayed.  BMP:  Recent Labs  05/03/15 0404 05/05/15 0821 04/28/2015 0811 05/07/15 1221  NA 141 139 141 142  K 4.3 4.7 4.6 5.4*  CL 106 105 104 107  CO2 20* 16* 19* 18*  GLUCOSE 70 66 83 86  BUN 61* 80* 94* 113*  CALCIUM 7.5* 7.9* 7.7* 7.3*  CREATININE 6.32* 7.86* 8.91* 9.35*  GFRNONAA 8* 6* 5* 5*  GFRAA 9* 7* 6* 6*  LIVER FUNCTION TESTS:  Recent Labs  04/03/15 1059 04/03/15 1615  04/13/15 0450  05/05/15 0821 05/05/15 1058 04/23/2015 0811 05/07/15 1221  BILITOT 0.6 0.7  --  0.7  --   --  1.1  --   --   AST 17 15  --  21  --   --  18  --   --   ALT 13* 10*  --  20  --   --  14*  --   --   ALKPHOS 66 60  --  66  --   --  79  --   --   PROT 5.3* 4.4*  --  5.0*  --   --  5.9*  --   --   ALBUMIN 2.7* 2.4*  < > 2.9*  < > 2.9* 2.8* 2.6* 2.0*  < > = values in this interval not displayed.  TUMOR MARKERS: No results for input(s): AFPTM, CEA, CA199, CHROMGRNA in the last 8760 hours.  Assessment and Plan:  ESRD Clotted Left arm fistula GI bleed now--anemia Hypotension NOT candidate for thrombolysis Now scheduled for tunneled HD catheter placement Risks and Benefits discussed with the patient including, but not limited to bleeding, infection, vascular injury, pneumothorax which may require  chest tube placement, air embolism or even death All of the patient's questions were answered, patient is agreeable to proceed. Consent signed and in chart.  Recheck this am cbc Wbc 24.4 afeb  Thank you for this interesting consult.  I greatly enjoyed meeting Erik Ochoa and look forward to participating in their care.  A copy of this report was sent to the requesting provider on this date.  Electronically Signed: Monia Sabal A , 7:53 AM   I spent a total of 20 Minutes    in face to face in clinical consultation, greater than 50% of which was counseling/coordinating care for tunneled HD catheter placement    Addendum: Hypotensive and unstable this am Dr Annamaria Boots discussed with Dr Verlon Setting procedure

## 2015-05-08 NOTE — Progress Notes (Signed)
 PT Cancellation Note  Patient Details Name: Erik Ochoa MRN: [id number] DOB: 1940-03-21   Cancelled Treatment:    Reason Eval/Treat Not Completed: Medical issues which prohibited therapy Per nephrology note this AM "[name] is having coffee ground emesis with dropping blood pressure, elevated potassium and is too unstable for dialysis." Noted that palliative care is on board with likely transition to comfort care pending meeting. Will hold comprehensive PT evaluation at this time.  [name] , 9:59 AM  [name], [place]

## 2015-05-08 NOTE — Progress Notes (Signed)
Dr. Charlies Silvers called to bedside. Patient glucose less than 10, systolic 50. Patient given 50 ml d5. New orders to be placed

## 2015-05-08 NOTE — Progress Notes (Signed)
Nutrition Brief Note  Chart reviewed. Pt now transitioning to comfort care.  No nutrition interventions warranted at this time.  Please consult as needed.   Corrin Parker, MS, RD, LDN Pager # [phone] After hours/ weekend pager # [phone]

## 2015-05-08 NOTE — Discharge Summary (Addendum)
  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  Assessment/Plan:    Hypovolemic shock - Hypotensive while on maintenance fluids - Continue midodrine  - Comfort care initiated this am per pt wish - Determined high risk for fistula, catheter placement - High risk to initiate HD  Hematemesis / Coffee ground emesis / Acute blood loss anemia  - Started this am - Initiated protonix drip   Chronic diastolic CHF - Compensated - Last 2 D ECHO with preserved EF  Chronic diarrhea - Stool for C. difficile and GI pathogen panel negative.  - Seen by GI who recommended EGD/colonoscopy on 3/29 but pt decline procedures   Chronic ascites - Gets paracenteses twice a month. Order for therapeutic paracentesis. 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 - Recent workup done with right heart cath swelling severely dilated RV and severe RV systolic  dysfunction.  A. Fib - CHADS vasc score 4 - Not on AC due to hemoptysis, bleeding   Severe protein calorie calorie malnutrition  - In the context of chronic illness   DVT Prophylaxis  - SCD's bilaterally due to risk of bleeding   Code Status: DNR/DNI     IV access:  Peripheral IV  Procedures and diagnostic studies:   US Paracentesis 05/05/2015 Successful ultrasound-guided paracentesis yielding 3 liters of peritoneal fluid. Read by: Ascencion Dike PA-C Electronically Signed By: Jacqulynn Cadet M.D. On: 05/05/2015 14:48   Dg Chest Port 1 View May 25, 2015 1. Similar appearance to the previous chest radiograph when allowing for lower lung volumes on the current exam. 2. There are moderate bilateral pleural effusions with associated atelectasis. There is some prominence of the bronchovascular markings, but no overt pulmonary edema. Electronically Signed By: Lajean Manes M.D. On: 05/25/2015 14:53   Medical Consultants:  GI Nephrology PCT  IAnti-Infectives:   None    Time: 19:13 2015-05-31   Signed:  Leisa Lenz  [facility] 31-May-2015, 8:24 PM

## 2015-05-08 NOTE — Progress Notes (Signed)
 [facility] Progress Note  Assessment/Plan: 1. Hypotension- acute on chronic with multiple issues playing role =pulmonary HTN, RV failure, and ESRD. On large dose of midodrine 20mg  tid but still have ascites( sp IR tap3/28) and hypotension with UF on HD.History of RV failure.  RV failure.SBP currently 60s. Midodrine was held D/t nausea-has been given however pt just vomited approx 200cc coffee ground material.  Give NS 500 cc now.  2. Hypoglycemia: BS < 20 on BMET, now 46. Given 1 amp d50w. Started on D10w@50cc /hr. Repeat BS pending.  3. ESRD- continue TTS schedule,today after IR declotts avf or places perm cath / will not pull much fluid. HD on hold.  4. Gi Bleed= noted bloody stools this am per RN notes- admit team RX 5. Anemia of ESRD = HGB 7.4. Having coffee ground emesis.  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 Nutrition: protein and caloric malnutrition with bitemporal wasting. Encourage protein intake 8. Diarrhea- chronic issue 9. Vascular access- left AVF with proximal thrill and bruit but not along venous drainage, likely with clot (has h/o stensosis requiring PTA). IR has been consulted however pt too unstable for perm cath-possible Temp cath if pt stabilizes.  10. Ascites- s/p paracentesis 3/28 and feels better.   Disposition- poor overall prognosis.palliative care seeing to help set goals/limits of care / NOW Is DNR  Erik Ochoa. Erik Ochoa , 8:53 AM  Erik Ochoa [phone]  Subjective:  "I'm as dry as a bone". Patient awake, responding appropriately to verbal C/O nausea, is vomiting coffee ground material.   Objective Filed Vitals:   05/07/15 2357  0015  0232  0526  BP: 59/34 89/48 86/45  66/30  Pulse: 104 86 88 69  Temp: 97.6 F (36.4 C) 97.4 F (36.3 C) 97.6 F (36.4 C) 97.2 F (36.2 C)  TempSrc: Oral Oral Oral Oral  Resp:    16  Height:      Weight:      SpO2: 100%  98% 98% 95%   Physical Exam General: Chronically ill appearing male in mild distress. "I feel bad". Heart: S1, S2, II/VI M.  Lungs: Bilateral breath sounds CTA at present Abdomen: soft, nontender, ascites present Extremities: No LE edema Dialysis Access: LFA AVF no bruit  OP Dialysis: [facility] 4h 73min 2/2.25 bath LFA AVF Hep 8400 from last admit Echo: LV EF 60-65%, D-shaped interventricular septum suggestive of RV pressure/volume overload, severely dilated RV with severe RV systolic dysfunction, PASP 59 mmHg. Moderate AS, mild MR  Additional Objective Labs: Basic Metabolic Panel:  Recent Labs Lab 04/08/2015 0811 05/07/15 1221  0600  NA 141 142 144  K 4.6 5.4* 6.5*  CL 104 107 105  CO2 19* 18* 7*  GLUCOSE 83 86 <20*  BUN 94* 113* 129*  CREATININE 8.91* 9.35* 10.68*  CALCIUM 7.7* 7.3* 8.1*  PHOS 7.9* 8.0* 11.3*   Liver Function Tests:  Recent Labs Lab 05/05/15 1058 04/16/2015 0811 05/07/15 1221  0600  AST 18  --   --   --   ALT 14*  --   --   --   ALKPHOS 79  --   --   --   BILITOT 1.1  --   --   --   PROT 5.9*  --   --   --   ALBUMIN 2.8* 2.6* 2.0* 2.3*   No results for input(s): LIPASE, AMYLASE in the last 168 hours. CBC:  Recent Labs Lab 04/15/2015 1315 05/03/15  [id number] 05/05/15 0821 04/08/2015 0811 05/07/15 1030  0600  WBC 12.1* 10.0 13.1* 10.7* 14.5* 24.4*  NEUTROABS 10.3*  --   --   --   --   --   HGB 10.3* 9.7* 9.9* 8.7* 6.7* 7.4*  HCT 34.3* 30.6* 32.4* 28.4* 21.6* 23.7*  MCV 100.0 100.0 99.4 99.6 98.6 100.4*  PLT 230 198 230 254 383 464*   Blood Culture    Component Value Date/Time   SDES BLOOD RIGHT HAND 04/03/2015 1620   SPECREQUEST BOTTLES DRAWN AEROBIC AND ANAEROBIC 5CC 04/03/2015 1620   CULT NO GROWTH 5 DAYS 04/03/2015 1620   REPTSTATUS 04/09/2015 FINAL 04/03/2015 1620    Cardiac Enzymes: No results for input(s): CKTOTAL, CKMB, CKMBINDEX, TROPONINI in the last 168 hours. CBG:  Recent Labs Lab 05/04/15 1706   GLUCAP 100*   Iron Studies: No results for input(s): IRON, TIBC, TRANSFERRIN, FERRITIN in the last 72 hours. @lablastinr3 @ Studies/Results: No results found. Medications: . sodium chloride 10 mL/hr at 05/04/15 1117  . dextrose 50 mL/hr at  0853   . sodium chloride   Intravenous Once  . aspirin EC  81 mg Oral Daily  . calcium acetate  667 mg Oral TID WC  . calcium gluconate  1 g Intravenous Once  . darbepoetin (ARANESP) injection - DIALYSIS  150 mcg Intravenous Q Thu-HD  . feeding supplement (NEPRO CARB STEADY)  237 mL Oral TID BM  . insulin aspart  10 Units Intravenous Once  . levothyroxine  50 mcg Oral QAC breakfast  . midodrine  20 mg Oral TID WC  . pantoprazole (PROTONIX) IV  40 mg Intravenous Q12H  . sildenafil  10 mg Oral Daily  . sodium chloride flush  3 mL Intravenous Q12H     I have seen and examined this patient and agree with plan as outlined by Erik Fairly, Ochoa.  Erik Ochoa is having coffee ground emesis with dropping blood pressure, elevated potassium and is too unstable for dialysis even if he did have a functioning access.  I discussed the current situation and he understands the severity of his illness and is amenable to transition towards comfort measures.  I also discussed the case with Dr. Charlies Silvers and Palliative care is already involved with his care.  His son is on his way to the hospital this morning. Erik John A Gurleen Larrivee,MD  9:14 AM

## 2015-05-08 NOTE — Progress Notes (Signed)
Patient noted to be throwing up coffee ground emesis. MD paged. Nephrology at beside PPI gtt ordered.

## 2015-05-08 NOTE — Progress Notes (Signed)
  7:13 PM  Called into room by patient's family stating that they think the patient just passed.  Upon assessment, heart and lung sounds absent, no palpable pulse.  Second RN, Ulice Dash, confirmed passing.  Pt's family remains at bedside, currently calling other family members.  Compassion cart at bedside.  IV lines unhooked from patient.  [place] [facility] notified.  Post mortem checklist initiated.  Dr. Charlies Silvers notified of patient's death, upon return phone call was informed to call the night shift to have the death certificate signed.  Night shift MD paged, awaiting return phone call.  200cc of morphine wasted at sink with Candise Bowens, RN.  Night shift RN to complete post mortem checklist. Princella Pellegrini

## 2015-05-08 NOTE — Progress Notes (Signed)
Palliative Care at bedside decision made by patient and son to become comfort care.

## 2015-05-08 NOTE — Care Management Important Message (Signed)
 Important Message  Patient Details  Name: Erik Ochoa MRN: [id number] Date of Birth: [birth year]   Medicare Important Message Given:  Yes    Percy Comp P [place] , 12:22 PM

## 2015-05-09 DEATH — deceased

## 2015-06-08 DEATH — deceased

## 2017-11-04 IMAGING — DX DG CHEST 2V
2 series · 2 of 2 positions shown · non-contrast
Comparison: 05/19/2012; 05/18/2012

CLINICAL DATA: Hypotension, hypoxia

EXAM:
CHEST  2 VIEW

[x chest ap]
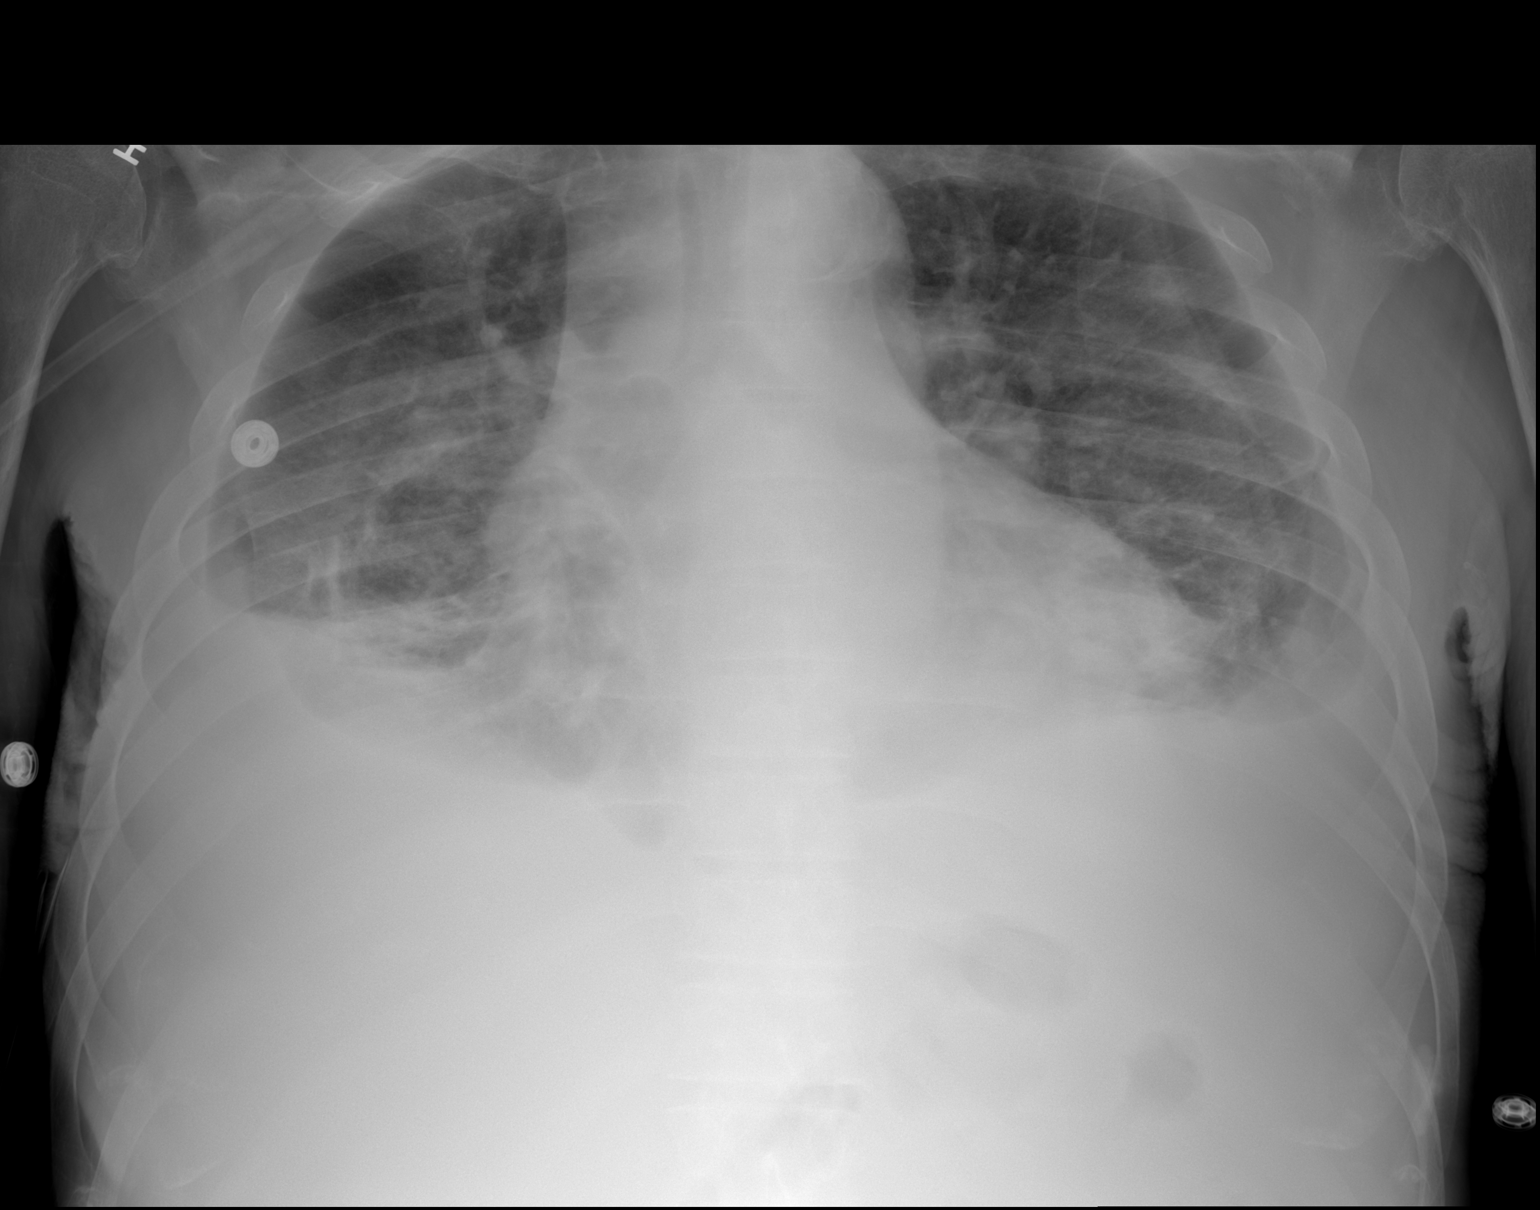

[w chest lat]
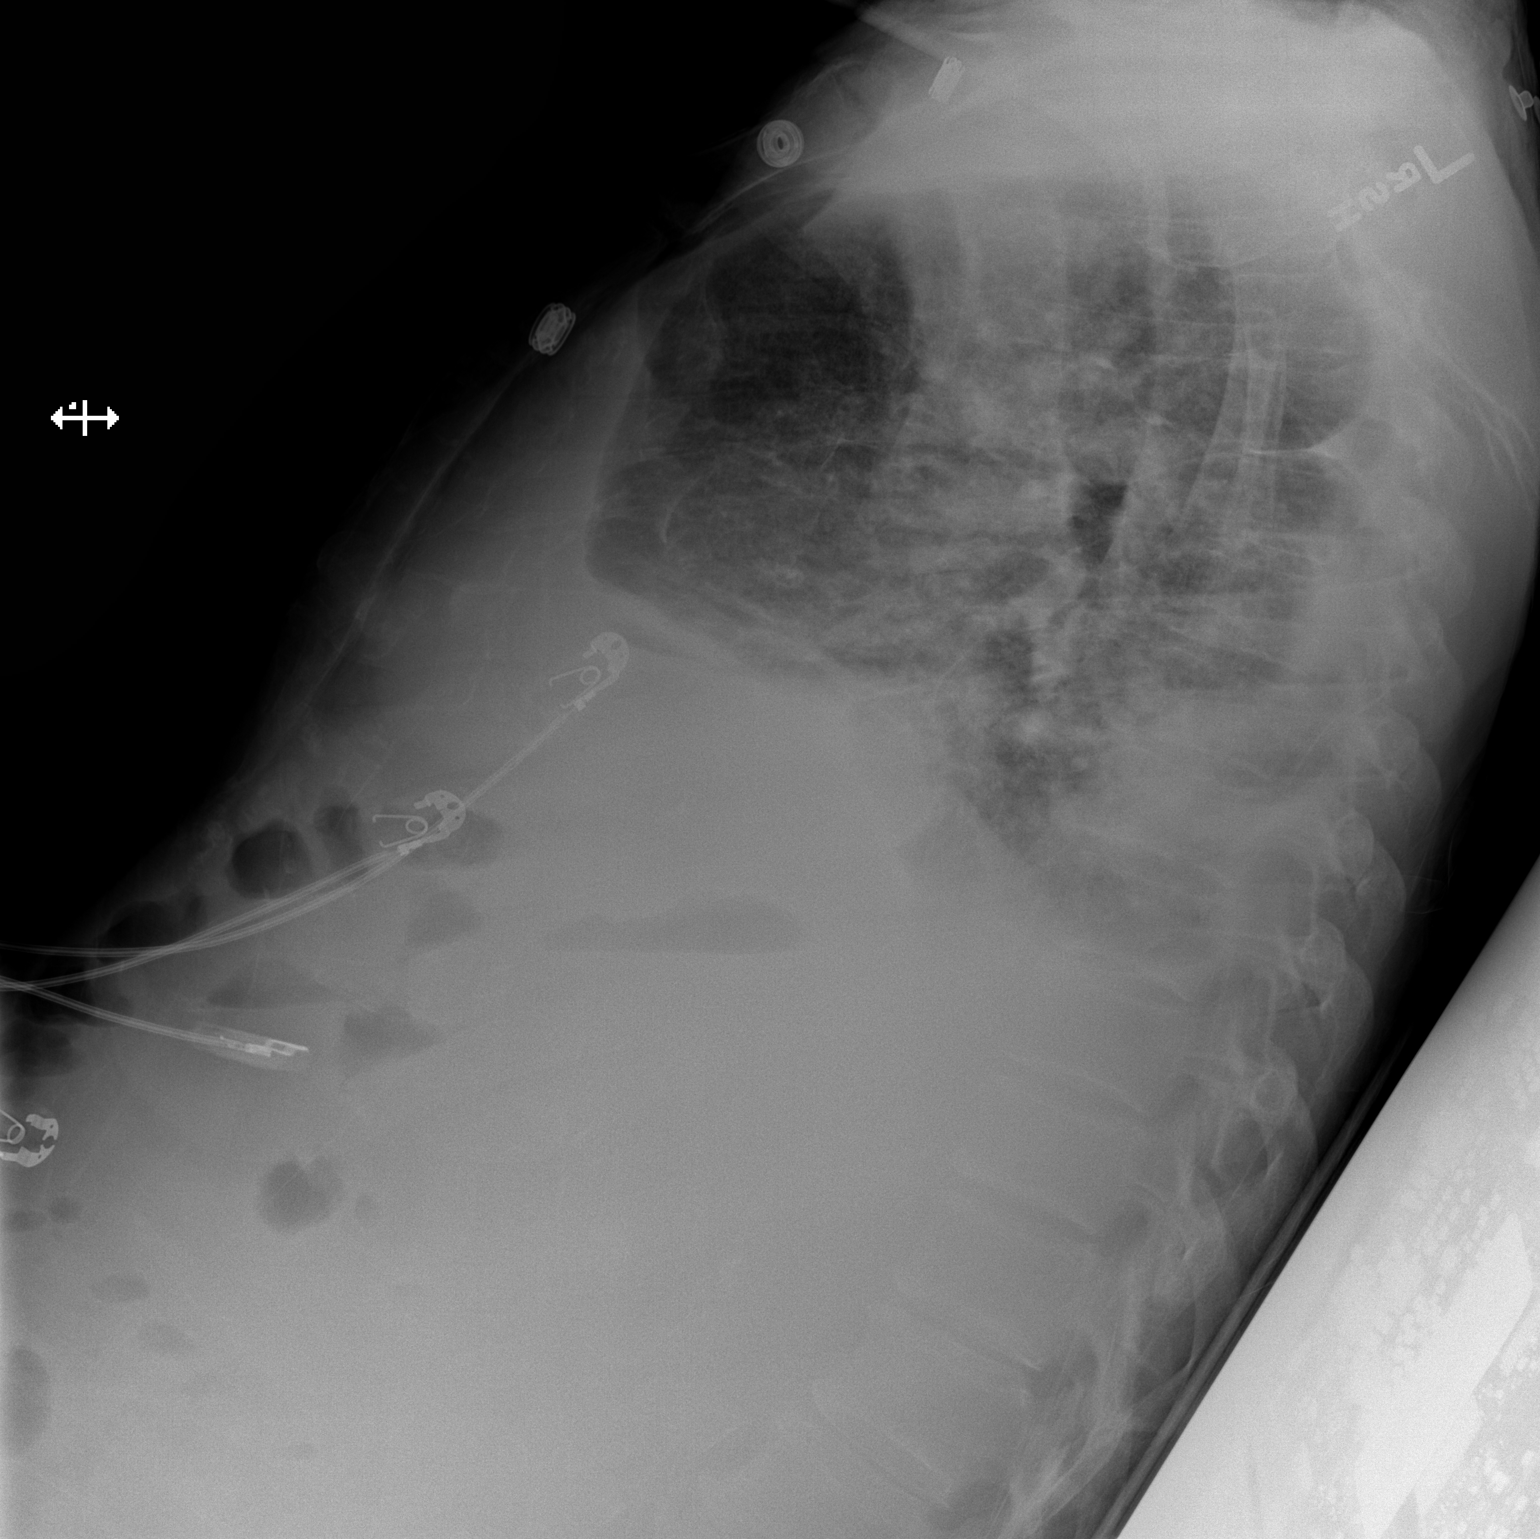

[2 of 2 positions shown; findings below may reference images not displayed]

FINDINGS: Grossly unchanged enlarged cardiac silhouette and mediastinal
contours given reduced lung volumes. The pulmonary vasculature is
indistinct with cephalization of flow. Interval development of small
to moderate-sized bilateral effusions with associated worsening
bilateral mid and lower lung heterogeneous opacities. No
pneumothorax. No acute osseus abnormalities.
IMPRESSION: Findings worrisome for pulmonary edema with small to moderate sized
bilateral effusions and associated bibasilar opacities, atelectasis
versus infiltrate.

## 2017-11-04 IMAGING — CR DG CHEST 1V PORT
1 series · 1 of 1 positions shown · non-contrast
Comparison: Two-view chest x-ray from the same day.

CLINICAL DATA: Encounter for central line placement.

EXAM:
PORTABLE CHEST - 1 VIEW

[AP]
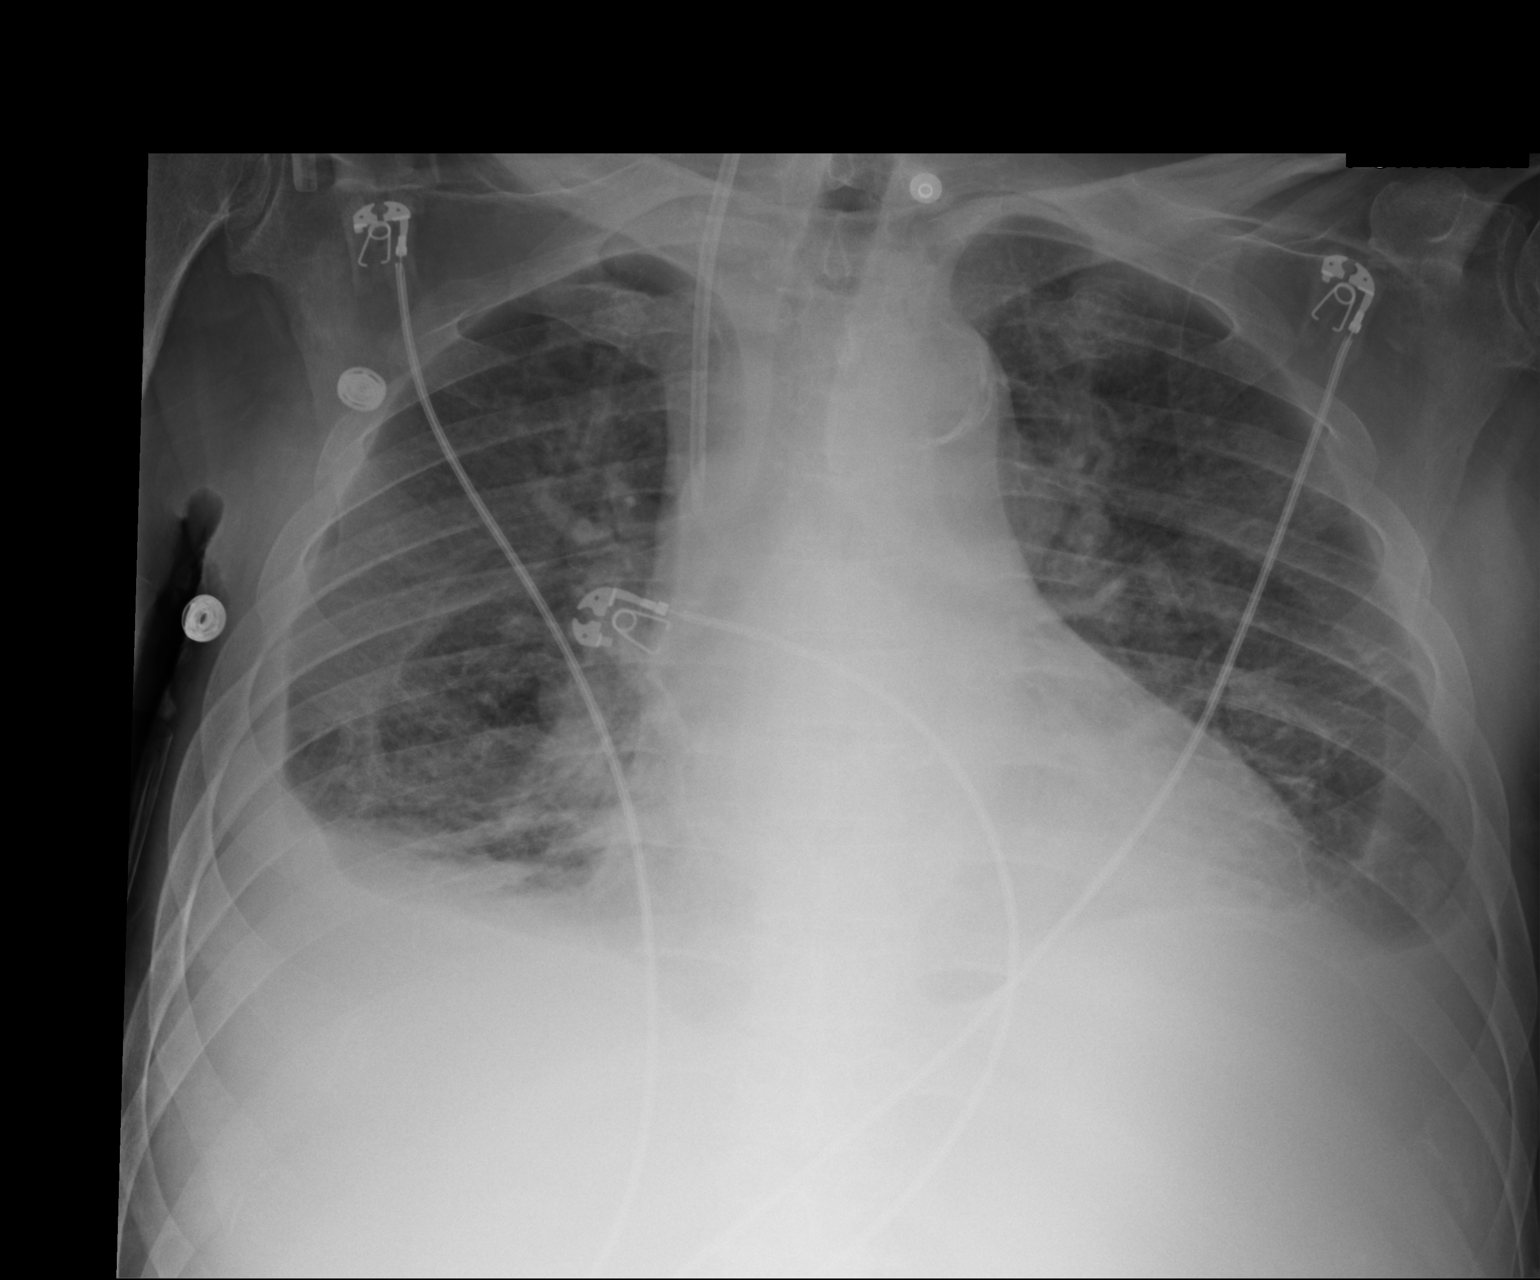

[1 of 1 positions shown; findings below may reference images not displayed]

FINDINGS: The heart is enlarged. Atherosclerotic calcifications are present at
the aortic arch. Bilateral pleural effusions are again noted.

Any right IJ line is in place. There is no pneumothorax. Lung
volumes have slightly improved.
IMPRESSION: 1. New right IJ line without radiographic evidence for complication.
2. Slightly improved lung volumes with persistent bilateral pleural
effusions and mild diffuse edema.

## 2017-11-13 IMAGING — NM NM PULMONARY VENT & PERF
16 series · 16 of 16 positions shown · non-contrast
Comparison: None; correlation chest radiograph 04/07/2015, none
more recent

CLINICAL DATA: Dyspnea, chronic atrial fibrillation, end-stage
renal disease on dialysis, diastolic CHF heart failure, hypotension,
volume overload, pulmonary hypertension, question chronic pulmonary
embolism

EXAM:
NUCLEAR MEDICINE VENTILATION - PERFUSION LUNG SCAN
TECHNIQUE: Ventilation images were obtained in multiple projections using
inhaled aerosol Vc-11m DTPA. Perfusion images were obtained in
multiple projections after intravenous injection of Vc-11m MAA.
RADIOPHARMACEUTICALS:  30.2 millicuries 9echnetium-88m DTPA aerosol
inhalation and 4.0 millicuries 9echnetium-88m MAA IV

[Series 1: ant/post vent · 4.14mm/px · 1 of 1 slices shown (1 of 2)]
[im 1/1]
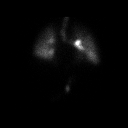

[Series 1: ant/post vent · 4.14mm/px · 1 of 1 slices shown (2 of 2)]
[im 1/1]
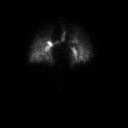

[Series 2: lao/rpo vent · 4.14mm/px · 1 of 1 slices shown (1 of 2)]
[im 1/1]
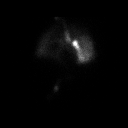

[Series 2: lao/rpo vent · 4.14mm/px · 1 of 1 slices shown (2 of 2)]
[im 1/1]
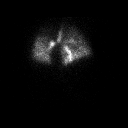

[Series 3: lpo/rao vent · 4.14mm/px · 1 of 1 slices shown (1 of 2)]
[im 1/1]
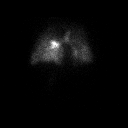

[Series 3: lpo/rao vent · 4.14mm/px · 1 of 1 slices shown (2 of 2)]
[im 1/1]
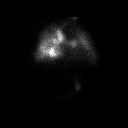

[Series 4: lt lat/rt lat vent · 4.14mm/px · 1 of 1 slices shown (1 of 2)]
[im 1/1]
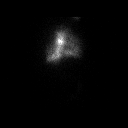

[Series 4: lt lat/rt lat vent · 4.14mm/px · 1 of 1 slices shown (2 of 2)]
[im 1/1]
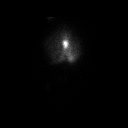

[Series 5: lt lat/rt lat perf · 4.14mm/px · 1 of 1 slices shown (1 of 2)]
[im 1/1]
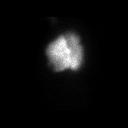

[Series 5: lt lat/rt lat perf · 4.14mm/px · 1 of 1 slices shown (2 of 2)]
[im 1/1]
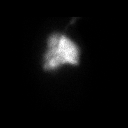

[Series 6: lpo/rao perf · 4.14mm/px · 1 of 1 slices shown (1 of 2)]
[im 1/1]
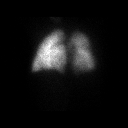

[Series 6: lpo/rao perf · 4.14mm/px · 1 of 1 slices shown (2 of 2)]
[im 1/1]
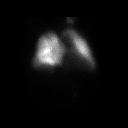

[Series 7: ant/post perf · 4.14mm/px · 1 of 1 slices shown (1 of 2)]
[im 1/1]
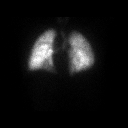

[Series 7: ant/post perf · 4.14mm/px · 1 of 1 slices shown (2 of 2)]
[im 1/1]
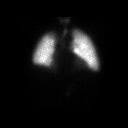

[Series 8: lao/rpo perf · 4.14mm/px · 1 of 1 slices shown (1 of 2)]
[im 1/1]
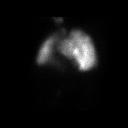

[Series 8: lao/rpo perf · 4.14mm/px · 1 of 1 slices shown (2 of 2)]
[im 1/1]
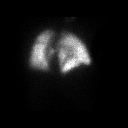

[16 of 16 positions shown; findings below may reference images not displayed]

FINDINGS: Ventilation: Diffusely diminished ventilation in the upper lobes.
Ventilation defects that LEFT lower lobe base and the posterior
RIGHT lower lobe. Central airway deposition of tracer.

Perfusion: Small subsegmental perfusion defect LEFT upper lobe.
Diminished perfusion at the posterior aspect of the RIGHT lower lobe
and at the posterior LEFT lower lobe base, sites corresponding to
pleural effusions and atelectasis on chest radiograph and matching
the ventilatory abnormalities. No additional segmental or
subsegmental perfusion defects identified.
IMPRESSION: Matching abnormal ventilation and perfusion at the lower lobes
corresponding to areas of atelectasis and effusion seen on chest
radiography, though the available chest radiograph is 5 days old.

Observed perfusion abnormalities are less than expected based on a
degree of atelectasis and volumes of pleural effusions identified by
radiography.

Findings represent a low probability for pulmonary embolism.
